# Patient Record
Sex: Male | Born: 1965 | Race: White | Hispanic: No | Marital: Single | Smoking: Current every day smoker
Health system: Southern US, Community
[De-identification: ages and names within clinical notes are randomized; demographics above are authoritative.]

## PROBLEM LIST (undated history)

## (undated) DIAGNOSIS — F1010 Alcohol abuse, uncomplicated: Secondary | ICD-10-CM

## (undated) DIAGNOSIS — I639 Cerebral infarction, unspecified: Secondary | ICD-10-CM

## (undated) DIAGNOSIS — M549 Dorsalgia, unspecified: Secondary | ICD-10-CM

## (undated) DIAGNOSIS — I10 Essential (primary) hypertension: Secondary | ICD-10-CM

## (undated) DIAGNOSIS — G4700 Insomnia, unspecified: Secondary | ICD-10-CM

## (undated) DIAGNOSIS — IMO0002 Reserved for concepts with insufficient information to code with codable children: Secondary | ICD-10-CM

## (undated) DIAGNOSIS — Z720 Tobacco use: Secondary | ICD-10-CM

## (undated) DIAGNOSIS — F419 Anxiety disorder, unspecified: Secondary | ICD-10-CM

## (undated) HISTORY — PX: UMBILICAL HERNIA REPAIR: SHX196

---

## 1998-05-16 ENCOUNTER — Emergency Department (HOSPITAL_COMMUNITY): Admission: EM | Admit: 1998-05-16 | Discharge: 1998-05-16 | Payer: Self-pay | Admitting: Emergency Medicine

## 1998-09-11 ENCOUNTER — Emergency Department (HOSPITAL_COMMUNITY): Admission: EM | Admit: 1998-09-11 | Discharge: 1998-09-11 | Payer: Self-pay | Admitting: Emergency Medicine

## 2000-10-24 ENCOUNTER — Emergency Department (HOSPITAL_COMMUNITY): Admission: EM | Admit: 2000-10-24 | Discharge: 2000-10-24 | Payer: Self-pay | Admitting: Emergency Medicine

## 2000-10-24 ENCOUNTER — Encounter: Payer: Self-pay | Admitting: Emergency Medicine

## 2000-10-31 ENCOUNTER — Emergency Department (HOSPITAL_COMMUNITY): Admission: EM | Admit: 2000-10-31 | Discharge: 2000-10-31 | Payer: Self-pay | Admitting: Internal Medicine

## 2000-11-09 ENCOUNTER — Emergency Department (HOSPITAL_COMMUNITY): Admission: EM | Admit: 2000-11-09 | Discharge: 2000-11-09 | Payer: Self-pay | Admitting: Emergency Medicine

## 2000-12-09 ENCOUNTER — Emergency Department (HOSPITAL_COMMUNITY): Admission: EM | Admit: 2000-12-09 | Discharge: 2000-12-09 | Payer: Self-pay | Admitting: *Deleted

## 2001-09-24 ENCOUNTER — Ambulatory Visit (HOSPITAL_COMMUNITY): Admission: RE | Admit: 2001-09-24 | Discharge: 2001-09-24 | Payer: Self-pay | Admitting: *Deleted

## 2001-09-28 ENCOUNTER — Emergency Department (HOSPITAL_COMMUNITY): Admission: EM | Admit: 2001-09-28 | Discharge: 2001-09-28 | Payer: Self-pay | Admitting: Emergency Medicine

## 2002-03-29 ENCOUNTER — Emergency Department (HOSPITAL_COMMUNITY): Admission: EM | Admit: 2002-03-29 | Discharge: 2002-03-29 | Payer: Self-pay | Admitting: Emergency Medicine

## 2002-05-01 ENCOUNTER — Ambulatory Visit (HOSPITAL_COMMUNITY): Admission: RE | Admit: 2002-05-01 | Discharge: 2002-05-01 | Payer: Self-pay | Admitting: Urology

## 2002-05-01 ENCOUNTER — Encounter: Payer: Self-pay | Admitting: Urology

## 2002-06-07 ENCOUNTER — Emergency Department (HOSPITAL_COMMUNITY): Admission: EM | Admit: 2002-06-07 | Discharge: 2002-06-07 | Payer: Self-pay | Admitting: *Deleted

## 2002-11-04 ENCOUNTER — Observation Stay (HOSPITAL_COMMUNITY): Admission: RE | Admit: 2002-11-04 | Discharge: 2002-11-05 | Payer: Self-pay | Admitting: General Surgery

## 2003-08-06 ENCOUNTER — Ambulatory Visit (HOSPITAL_COMMUNITY): Admission: RE | Admit: 2003-08-06 | Discharge: 2003-08-06 | Payer: Self-pay | Admitting: Family Medicine

## 2004-01-31 ENCOUNTER — Emergency Department (HOSPITAL_COMMUNITY): Admission: EM | Admit: 2004-01-31 | Discharge: 2004-01-31 | Payer: Self-pay | Admitting: Emergency Medicine

## 2004-05-19 ENCOUNTER — Ambulatory Visit (HOSPITAL_COMMUNITY): Admission: RE | Admit: 2004-05-19 | Discharge: 2004-05-19 | Payer: Self-pay | Admitting: Family Medicine

## 2004-11-07 ENCOUNTER — Emergency Department (HOSPITAL_COMMUNITY): Admission: EM | Admit: 2004-11-07 | Discharge: 2004-11-07 | Payer: Self-pay | Admitting: Emergency Medicine

## 2007-04-11 ENCOUNTER — Ambulatory Visit (HOSPITAL_COMMUNITY): Admission: RE | Admit: 2007-04-11 | Discharge: 2007-04-11 | Payer: Self-pay | Admitting: Family Medicine

## 2008-03-03 ENCOUNTER — Inpatient Hospital Stay (HOSPITAL_COMMUNITY): Admission: EM | Admit: 2008-03-03 | Discharge: 2008-03-05 | Payer: Self-pay | Admitting: Emergency Medicine

## 2008-03-03 ENCOUNTER — Ambulatory Visit: Payer: Self-pay | Admitting: Cardiology

## 2008-03-03 ENCOUNTER — Encounter (INDEPENDENT_AMBULATORY_CARE_PROVIDER_SITE_OTHER): Payer: Self-pay | Admitting: Pediatrics

## 2008-03-06 ENCOUNTER — Inpatient Hospital Stay (HOSPITAL_COMMUNITY): Admission: EM | Admit: 2008-03-06 | Discharge: 2008-03-07 | Payer: Self-pay | Admitting: Emergency Medicine

## 2008-09-23 ENCOUNTER — Ambulatory Visit (HOSPITAL_COMMUNITY): Admission: RE | Admit: 2008-09-23 | Discharge: 2008-09-23 | Payer: Self-pay | Admitting: Family Medicine

## 2008-10-14 ENCOUNTER — Emergency Department (HOSPITAL_COMMUNITY): Admission: EM | Admit: 2008-10-14 | Discharge: 2008-10-14 | Payer: Self-pay | Admitting: Emergency Medicine

## 2008-10-29 ENCOUNTER — Ambulatory Visit: Payer: Self-pay | Admitting: Internal Medicine

## 2008-10-29 ENCOUNTER — Inpatient Hospital Stay (HOSPITAL_COMMUNITY): Admission: EM | Admit: 2008-10-29 | Discharge: 2008-10-31 | Payer: Self-pay | Admitting: Emergency Medicine

## 2008-10-30 ENCOUNTER — Ambulatory Visit: Payer: Self-pay | Admitting: Gastroenterology

## 2008-11-08 ENCOUNTER — Encounter: Payer: Self-pay | Admitting: Internal Medicine

## 2009-07-18 ENCOUNTER — Ambulatory Visit (HOSPITAL_COMMUNITY): Admission: RE | Admit: 2009-07-18 | Discharge: 2009-07-18 | Payer: Self-pay | Admitting: Family Medicine

## 2010-04-21 ENCOUNTER — Inpatient Hospital Stay (HOSPITAL_COMMUNITY): Admission: EM | Admit: 2010-04-21 | Discharge: 2010-04-24 | Payer: Self-pay | Admitting: Emergency Medicine

## 2010-07-23 ENCOUNTER — Encounter: Payer: Self-pay | Admitting: Family Medicine

## 2010-07-24 ENCOUNTER — Encounter: Payer: Self-pay | Admitting: Family Medicine

## 2010-09-13 LAB — RAPID URINE DRUG SCREEN, HOSP PERFORMED
Amphetamines: NOT DETECTED
Barbiturates: NOT DETECTED
Benzodiazepines: POSITIVE — AB
Cocaine: NOT DETECTED
Opiates: NOT DETECTED
Tetrahydrocannabinol: NOT DETECTED

## 2010-09-13 LAB — URINALYSIS, ROUTINE W REFLEX MICROSCOPIC
Bilirubin Urine: NEGATIVE
Glucose, UA: NEGATIVE mg/dL
Hgb urine dipstick: NEGATIVE
Ketones, ur: NEGATIVE mg/dL
Nitrite: NEGATIVE
Protein, ur: NEGATIVE mg/dL
Specific Gravity, Urine: 1.02 (ref 1.005–1.030)
Urobilinogen, UA: 0.2 mg/dL (ref 0.0–1.0)
pH: 6 (ref 5.0–8.0)

## 2010-09-13 LAB — COMPREHENSIVE METABOLIC PANEL
ALT: 172 U/L — ABNORMAL HIGH (ref 0–53)
AST: 381 U/L — ABNORMAL HIGH (ref 0–37)
Albumin: 3.2 g/dL — ABNORMAL LOW (ref 3.5–5.2)
Alkaline Phosphatase: 313 U/L — ABNORMAL HIGH (ref 39–117)
BUN: 9 mg/dL (ref 6–23)
CO2: 24 mEq/L (ref 19–32)
Calcium: 8.5 mg/dL (ref 8.4–10.5)
Chloride: 98 mEq/L (ref 96–112)
Creatinine, Ser: 0.84 mg/dL (ref 0.4–1.5)
GFR calc Af Amer: >60 mL/min (ref >60)
GFR calc non Af Amer: >60 mL/min (ref >60)
Glucose, Bld: 108 mg/dL — ABNORMAL HIGH (ref 70–99)
Potassium: 3.8 mEq/L (ref 3.5–5.1)
Sodium: 133 mEq/L — ABNORMAL LOW (ref 135–145)
Total Bilirubin: 1.8 mg/dL — ABNORMAL HIGH (ref 0.3–1.2)
Total Protein: 7 g/dL (ref 6.0–8.3)

## 2010-09-13 LAB — HEPATIC FUNCTION PANEL
ALT: 120 U/L — ABNORMAL HIGH (ref 0–53)
AST: 191 U/L — ABNORMAL HIGH (ref 0–37)
Albumin: 2.8 g/dL — ABNORMAL LOW (ref 3.5–5.2)
Alkaline Phosphatase: 267 U/L — ABNORMAL HIGH (ref 39–117)
Bilirubin, Direct: 0.9 mg/dL — ABNORMAL HIGH (ref 0.0–0.3)
Indirect Bilirubin: 1.3 mg/dL — ABNORMAL HIGH (ref 0.3–0.9)
Total Bilirubin: 2.2 mg/dL — ABNORMAL HIGH (ref 0.3–1.2)
Total Protein: 6.4 g/dL (ref 6.0–8.3)

## 2010-09-13 LAB — CBC
HCT: 40.2 % (ref 39.0–52.0)
Hemoglobin: 15.4 g/dL (ref 13.0–17.0)
MCH: 37.8 pg — ABNORMAL HIGH (ref 26.0–34.0)
MCHC: 38.2 g/dL — ABNORMAL HIGH (ref 30.0–36.0)
MCV: 99.1 fL (ref 78.0–100.0)
Platelets: 198 10*3/uL (ref 150–400)
RBC: 4.06 MIL/uL — ABNORMAL LOW (ref 4.22–5.81)
RDW: 14.1 % (ref 11.5–15.5)
WBC: 8.3 10*3/uL (ref 4.0–10.5)

## 2010-09-13 LAB — DIFFERENTIAL
Band Neutrophils: 0 % (ref 0–10)
Basophils Absolute: 0.1 10*3/uL (ref 0.0–0.1)
Basophils Relative: 1 % (ref 0–1)
Blasts: 0 %
Eosinophils Absolute: 0.1 10*3/uL (ref 0.0–0.7)
Eosinophils Relative: 1 % (ref 0–5)
Lymphocytes Relative: 16 % (ref 12–46)
Lymphs Abs: 1.3 10*3/uL (ref 0.7–4.0)
Metamyelocytes Relative: 0 %
Monocytes Absolute: 0.2 10*3/uL (ref 0.1–1.0)
Monocytes Relative: 2 % — ABNORMAL LOW (ref 3–12)
Myelocytes: 0 %
Neutro Abs: 6.6 10*3/uL (ref 1.7–7.7)
Neutrophils Relative %: 80 % — ABNORMAL HIGH (ref 43–77)
Promyelocytes Absolute: 0 %
nRBC: 0 /100 WBC

## 2010-09-13 LAB — AMMONIA
Ammonia: 38 umol/L — ABNORMAL HIGH (ref 11–35)
Ammonia: 44 umol/L — ABNORMAL HIGH (ref 11–35)

## 2010-09-13 LAB — LIPASE, BLOOD
Lipase: 37 U/L (ref 11–59)
Lipase: 66 U/L — ABNORMAL HIGH (ref 11–59)

## 2010-09-13 LAB — ETHANOL: Alcohol, Ethyl (B): <5 mg/dL (ref 0–10)

## 2010-09-13 LAB — BASIC METABOLIC PANEL
CO2: 29 mEq/L (ref 19–32)
Calcium: 8.3 mg/dL — ABNORMAL LOW (ref 8.4–10.5)
GFR calc non Af Amer: >60 mL/min (ref >60)

## 2010-10-10 LAB — HEPATIC FUNCTION PANEL
AST: 223 U/L — ABNORMAL HIGH (ref 0–37)
Albumin: 3.7 g/dL (ref 3.5–5.2)
Total Protein: 7.1 g/dL (ref 6.0–8.3)

## 2010-10-10 LAB — ANA: Anti Nuclear Antibody(ANA): NEGATIVE

## 2010-10-10 LAB — MITOCHONDRIAL ANTIBODIES: Mitochondrial M2 Ab, IgG: <20.1 Units (ref <20.1)

## 2010-10-10 LAB — BASIC METABOLIC PANEL
Calcium: 9.1 mg/dL (ref 8.4–10.5)
Chloride: 98 mEq/L (ref 96–112)
Creatinine, Ser: 0.98 mg/dL (ref 0.4–1.5)
GFR calc Af Amer: >60 mL/min (ref >60)
Sodium: 131 mEq/L — ABNORMAL LOW (ref 135–145)

## 2010-10-10 LAB — CERULOPLASMIN: Ceruloplasmin: 29 mg/dL (ref 21–63)

## 2010-10-10 LAB — AMMONIA: Ammonia: 41 umol/L — ABNORMAL HIGH (ref 11–35)

## 2010-10-10 LAB — IGG, IGA, IGM: IgM, Serum: 109 mg/dL (ref 60–263)

## 2010-10-10 LAB — ANTI-SMOOTH MUSCLE ANTIBODY, IGG: F-Actin IgG: <20 U (ref <20)

## 2010-10-11 LAB — COMPREHENSIVE METABOLIC PANEL
ALT: 138 U/L — ABNORMAL HIGH (ref 0–53)
Albumin: 4.2 g/dL (ref 3.5–5.2)
Alkaline Phosphatase: 289 U/L — ABNORMAL HIGH (ref 39–117)
Glucose, Bld: 112 mg/dL — ABNORMAL HIGH (ref 70–99)
Potassium: 4 mEq/L (ref 3.5–5.1)
Sodium: 129 mEq/L — ABNORMAL LOW (ref 135–145)
Total Protein: 8.2 g/dL (ref 6.0–8.3)

## 2010-10-11 LAB — URINALYSIS, ROUTINE W REFLEX MICROSCOPIC
Ketones, ur: NEGATIVE mg/dL
Nitrite: NEGATIVE
Specific Gravity, Urine: 1.02 (ref 1.005–1.030)
pH: 5 (ref 5.0–8.0)

## 2010-10-11 LAB — RAPID URINE DRUG SCREEN, HOSP PERFORMED
Barbiturates: NOT DETECTED
Cocaine: NOT DETECTED
Opiates: NOT DETECTED

## 2010-10-11 LAB — IRON AND TIBC
Iron: 353 ug/dL — ABNORMAL HIGH (ref 42–135)
UIBC: <55 ug/dL

## 2010-10-11 LAB — DIFFERENTIAL
Basophils Relative: 1 % (ref 0–1)
Eosinophils Absolute: 0 10*3/uL (ref 0.0–0.7)
Eosinophils Relative: 1 % (ref 0–5)
Monocytes Absolute: 0.5 10*3/uL (ref 0.1–1.0)
Monocytes Relative: 13 % — ABNORMAL HIGH (ref 3–12)
Neutrophils Relative %: 46 % (ref 43–77)

## 2010-10-11 LAB — CBC
Hemoglobin: 14 g/dL (ref 13.0–17.0)
MCHC: 35.6 g/dL (ref 30.0–36.0)
RBC: 4.1 MIL/uL — ABNORMAL LOW (ref 4.22–5.81)

## 2010-10-11 LAB — CK TOTAL AND CKMB (NOT AT ARMC): CK, MB: 2.5 ng/mL (ref 0.3–4.0)

## 2010-10-11 LAB — FERRITIN: Ferritin: 1641 ng/mL — ABNORMAL HIGH (ref 22–322)

## 2010-10-11 LAB — AMMONIA: Ammonia: 66 umol/L — ABNORMAL HIGH (ref 11–35)

## 2010-11-14 NOTE — Discharge Summary (Signed)
Anthony Hoover, Anthony Hoover               ACCOUNT NO.:  [id number]   MEDICAL RECORD NO.:  [id number]          PATIENT TYPE:  INP   LOCATION:  3023                         FACILITY:  [facility]   PHYSICIAN:  Pramod P. Pearlean Brownie, MD    DATE OF BIRTH:  1965/12/24   DATE OF ADMISSION:  03/06/2008  DATE OF DISCHARGE:  03/07/2008                               DISCHARGE SUMMARY   ADMISSION DIAGNOSIS:  Increasing left-sided weakness.   DISCHARGE DIAGNOSES:  1. Transient worsening of her recent subacute right subcortical      infarct secondary to dehydration with urinary tract infection and      heavy alcohol intake.  2. Recent subcortical right brain infarct in September 2009, not      visualized on initial MRI, but seen on subsequent MRI on March 06, 2008.  3. Hyperlipidemia  4. Hypertension.  5. Chronic tobacco abuse and alcohol abuse.+   HOSPITAL COURSE:  Anthony Hoover is a 45 year old Caucasian gentleman who was  recently discharged from the hospital on March 05, 2008, with  admission on March 03, 2008, for presumed right subcortical infarct.  He presented at that time initially with n.p.o. was given IV t-PA.  He  made significant improvement after the t-PA infusion and recovered near  completely.  He had a stroke workup and was discharged home on aspirin  on March 05, 2008.  He states in the night he woke up almost every  hourly and went to the bathroom.  He woke up at 5:30 and went back to  sleep.  At 8:30, when he woke up, he noticed he had left-sided weakness  and clumsiness.  He was seen in the emergency room later that morning.  He was beyond the time window for thrombolysis plus Narcan given his  recent stroke and thrombolysis.  The patient was thought to be  dehydrated and had an urinary tract infection.  On his UA, he had 21-50  WBCs.  He was admitted, given IV hydration with normal saline, and  started on Cipro for UTI.  Limited MRI of the brain was repeated which  confirmed  a subtle area of diffusion positivity in the right corona  radiata subcortical region which was presumably his stroke from a few  days ago which had not been seen on the initial MRI.  His cholesterol  profile was repeated which was a little bit improved from the one done 4  days ago with total cholesterol down from 313 to 255, triglycerides 165  was decreased to 128, HDL was 65, LDL was 144, hemoglobin A1c was 5.6.  The patient improved with hydration and antibiotics.  He was seen by  physical therapy on the day of discharge and was able to ambulate 200  feet without any loss of balance and was close to his baseline mobility.  The patient was counseled to quit smoking as well as stop alcohol.  His  aspirin was changed to Plavix for secondary stroke prevention.  He was  discharged home in a stable condition and was also advised to consider  participation  by the IRIS stroke prevention trial.   His discharge medications are as follows:  Cipro 100 mg twice a day for  5 days, Plavix 75 mg a day, WelChol 1250 twice a day, Tricor 145 daily,  methadone 10 mg three times a day, and multivitamin once a day.   He was advised to call the office and make an appointment to see me in 2  months.            ______________________________  [name]. Pearlean Brownie, MD     PPS/MEDQ  D:  03/07/2008  T:  03/08/2008  Job:  [id number]

## 2010-11-14 NOTE — H&P (Signed)
NAMEPASCAL, STIGGERS               ACCOUNT NO.:  [id number]   MEDICAL RECORD NO.:  [id number]          PATIENT TYPE:  INP   LOCATION:  A313                          FACILITY:  [facility]   PHYSICIAN:  Tesfaye D. Felecia Shelling, MD   DATE OF BIRTH:  03-Jul-1965   DATE OF ADMISSION:  10/29/2008  DATE OF DISCHARGE:  LH                              HISTORY & PHYSICAL   CHIEF COMPLAINT:  Muscle cramps.   HISTORY OF PRESENT ILLNESS:  This is 45 years old male patient of Dr.  Oval Linsey, came to emergency room with above complaints.  The  patient has a history of CVA, hypertension, and alcohol and tobacco use.  He has been in his usual state of health until last night when he  started having worsening muscle cramps all over his body.  The patient  came to emergency room and he was evaluated.  His blood test showed low  level of sodium and elevated ammonia and liver function tests.  The  patient also screened for substance and alcohol.  His alcohol level was  zero and he had no any addictive substance in his blood test.  The  patient was then started on IV fluids and was admitted for further  evaluation.   REVIEW OF SYSTEMS:  The patient had no fever or chills, cough, chest  pain, nausea, vomiting, abdominal pain, dysuria, urgency, or frequency  of urination.   PAST MEDICAL HISTORY:  1. History of CVA.  2. Hypertension.  3. History of alcohol abuse.  4. History of narcotic abuse.  5. Hyperlipidemia.  6. Left gynecomastia.  7. Umbilical hernia and is status post repair.   SOCIAL HISTORY:  The patient is currently unemployed.  He smokes about 1-  pack of cigarettes per day.  The patient drinks beer.  He had history of  substance abuse in the past.  However, the patient is currently on  methadone.   MEDICATIONS:  1. Methadone 10 mg daily.  2. Cymbalta 30 mg daily.  3. Lisinopril HCT 20/12.5 mg daily.  4. Plavix 75 mg daily.  5. Topamax 100 mg daily.  6. Zetia 10 mg daily.  7. WelChol 2  tablets b.i.d.  8. Aspirin 81 mg daily.   SOCIAL HISTORY:  The patient is single.  He is currently employed.  He  has a history of alcohol, tobacco, and substance abuse.   PHYSICAL EXAMINATION:  GENERAL:  The patient is alert, awake, and  resting.  VITAL SIGNS:  Blood pressure 142/92, pulse 100, respiratory rate 20,  temperature 97.7 degrees Fahrenheit.  HEENT:  Pupils are equal and reactive.  NECK:  Supple.  CHEST:  Decreased air entry, bilateral rhonchi.  CARDIOVASCULAR SYSTEM:  First and second heart sound heard.  No murmur,  no gallop.  ABDOMEN:  Soft and relaxed.  Bowel sounds are positive.  No mass or  organomegaly.  EXTREMITIES:  No leg edema.   LABORATORY DATA ON ADMISSION:  Ammonia is 66, troponin is 0.02.  CK-MB  2.5, CPK 116.  CBC; WBC 3.9, hemoglobin 14.0, hematocrit 39.3, and  platelet 293.  Sodium  129, potassium 4.0, chloride 94, carbon dioxide  0.3, glucose 112, BUN 16, creatinine 1.03, and alkaline phosphatase 289.  AST 216, ALT 138, total bilirubin 1.7, total protein 8.2, albumin 4.2,  calcium 9.5.  PT 12.7, INR 0.9, PTT 29.   ASSESSMENT:  1. Hyponatremia, etiology unclear.  2. Elevated ammonia and abnormal liver function tests, probably      secondary to alcohol abuse.  3. Status post cerebrovascular accident.  4. Hypertension.  5. Hyperlipidemia.  6. History of alcohol, tobacco, and substance abuse.   PLAN:  We will continue the patient on normal saline.  We will do GI  consult.  We will monitor his BMP and continue the patient on his  regular treatment.      Tesfaye D. Felecia Shelling, MD  Electronically Signed     TDF/MEDQ  D:  10/29/2008  T:  10/30/2008  Job:  [id number]

## 2010-11-14 NOTE — H&P (Signed)
Anthony Hoover, Anthony Hoover               ACCOUNT NO.:  [id number]   MEDICAL RECORD NO.:  [id number]          PATIENT TYPE:  INP   LOCATION:  3023                         FACILITY:  [facility]   PHYSICIAN:  Pramod P. Pearlean Brownie, MD    DATE OF BIRTH:  05-25-1966   DATE OF ADMISSION:  03/06/2008  DATE OF DISCHARGE:                              HISTORY & PHYSICAL   REASON FOR REFERRAL:  Code stroke.   HISTORY OF PRESENT ILLNESS:  Anthony Hoover is a 45 year old Caucasian male,  who developed sudden onset of left-sided weakness and clumsiness this  morning at 5:30 a.m.  The patient states he was fine last night, but had  to get up almost every hour to go to the bathroom.  Last time, he woke  up at 5:30, when he walked to the bathroom, went back and slept and when  he woke up at 8:30, he noted his left side was weak and clumsy and he  had trouble controlling his left side.  He denied any headache or  slurred speech.  The patient had similar episode on March 03, 2008,  when he was pumping gas in the morning and his left foot got stuck to  the ground.  He also had some trouble using his left hand and could not  open the car door, but managed to drive himself and drove to the ER.  At  that time, he was found to have left hemiparesis and thought to have  right subcortical infarct.  CT scan was unremarkable.  He was given IV  TPA by Dr. Sharene Skeans.  The patient did well and recovered completely  after the TPA infusion.  His MRI scan, in fact, was negative for acute  stroke.  His vascular risk factors include hypertension, hyperlipidemia,  smoking, and alcohol.  The patient was started on aspirin and advised to  take WelChol and TriCor for his hyperlipidemia and advised to quit  smoking.  The patient states he has actually quit smoking and he was  doing well until last night.  He denies any fever or dysuria.   PAST MEDICAL HISTORY:  As stated above.   MEDICATION LIST:  WelChol, TriCor, aspirin, methadone for  chronic pain.   MEDICATION ALLERGIES:  None known.   FAMILY HISTORY:  Positive for stroke in maternal grandfather.   SOCIAL HISTORY:  The patient is unemployed Personnel officer.  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  He smokes 1-1/2 packs cigarettes per day since 20-30  years, just quit recently last week.  He drinks 10-12 beers per day.   REVIEW OF SYSTEMS:  Positive for dysuria, stroke, left-sided weakness as  stated above.   PHYSICAL EXAMINATION:  GENERAL:  Reveals a frail Caucasian male, who is  not in distress.  VITAL SIGNS:  He is afebrile, pulse rate is 110 per minute, blood  pressure is 130/80, respiratory 16 per minute, distal pulses are well  felt.  HEAD:  Nontraumatic.  NECK:  Supple with no bruit.  ENT:  Unremarkable.  CARDIAC:  No murmur or gallop.  LUNGS:  Clear to auscultation.  ABDOMEN:  Soft, nontender.  NEUROLOGIC:  He is awake, alert, and oriented to time, place, and  person.  There is no aphasia, apraxia, or dysarthria.  Eye movements are  full range.  There is no nystagmus.  Visual acuity and fields adequate.  Face is symmetric.  Tongue is midline.  Motor system exam reveals mild  left upper and lower extremity drift.  He has 4/5 strength on the left  side.  He has impaired finger-to-nose and knee-to-heel coordination on  the left.  He has significant weakness of left grip and intrinsic hand  muscles as well as left ankle dorsiflexors.  His NIH stroke scale is 4.  Gait was not tested.   DATA REVIEWED:  Noncontrast CAT scan of the head done today is  unremarkable.  Admission labs  reveal elevated liver enzymes and WBC  count is 14.1.  Electrolytes are normal.   IMPRESSION:  A 41-year gentleman with sudden onset of left-sided  weakness and clumsiness likely due to right hemispheric subcortical  infarct.  The patient had a similar admission less than a week ago for  similar presentation and was treated with IV thrombolysis with t-PA with  excellent results with MRI has  not shown a definite stroke.   PLAN:  The patient has presented within 5 hours of onset of his  symptoms, hence he will not qualify again for thrombolysis plus it is  written stroke and thrombolysis administration would rule him out anyway  for aggressive intervention.  The patient is admitted to the Stroke  Service.  We will give IV hydration.  Treat for presumed UTI.  Alcohol  withdrawal questions.  Given medications for hyperlipidemia, counseled  the patient on smoking cessation.  I have also counseled him to quit  alcohol as well.  Physical, occupational, speech therapy consults.  Changes aspirin to Plavix since he does have recurrent stroke being on  aspirin.  We will also check limited MRI scan of the brain, __________  imaging only since he has had a recent complete stroke workup.  I had  long discussion with the patient and his family and answered questions.           ______________________________  Anthony Hoover. Pearlean Brownie, MD     PPS/MEDQ  D:  03/06/2008  T:  03/07/2008  Job:  [id number]

## 2010-11-14 NOTE — Consult Note (Signed)
Anthony Hoover, Anthony Hoover               ACCOUNT NO.:  [id number]   MEDICAL RECORD NO.:  [id number]          PATIENT TYPE:  INP   LOCATION:  A313                          FACILITY:  [facility]   PHYSICIAN:  R. Roetta Sessions, M.D. DATE OF BIRTH:  Jun 24, 1966   DATE OF CONSULTATION:  10/29/2008  DATE OF DISCHARGE:                                 CONSULTATION   REASON FOR CONSULTATION:  Abnormal LFTs and elevated ammonia level.   REQUESTING PHYSICIAN:  Dr. Avon Gully covering for Dr. Oval Linsey.   HISTORY OF PRESENT ILLNESS:  The patient is a pleasant 45 year old  Caucasian gentleman with history of chronic alcohol abuse, CVA in  September of 2009 who presented to the hospital with complaints of  severe muscle cramps.  He also felt like he was in a fog.  He had a  headache.  Because of his history of stroke he felt like he needed to be  evaluated.  Upon presentation he was noted to have an elevated ammonia  level of 66, his total bilirubin was 1.7, alk phos 289, AST 216, ALT  138, albumin 4.2, his sodium level was 129 and his potassium normal,  alcohol level less than 5.  INR is 0.9.  He did not have any imaging  studies.  He was admitted for further evaluation.  We were consulted  regarding elevated ammonia level and abnormal LFTs.   Patient has a history of abnormal LFTs, at least dating back to  September of 2009 when he presented with a stroke.  At that time his  total bilirubin was 2.6, alk phos 610, AST 291, ALT 218.  He states that  they have remained abnormal as an outpatient.  He is not really sure of  his previous workup.  Review of eChart does not reveal any recent  imaging studies of his liver.   He denies any abdominal pain, nausea or vomiting, melena, bright red  blood per rectum and constipation, diarrhea, anorexia.  His weight has  been stable.  Denies any dysphagia or odynophagia.  He does have some  heartburn issues and takes Prevacid as needed.   MEDICATIONS AT  HOME:  1. Methadone 30 mg t.i.d.  2. Plavix 75 mg daily.  3. Lisinopril/HCTZ 20/12.5 mg daily.  4. Prevacid p.r.n.   ALLERGIES:  NO KNOWN DRUG ALLERGIES.   PAST MEDICAL HISTORY:  1. Chronic back pain.  2. History of narcotic abuse.  3. History of chronic alcohol abuse.  4. Hyperlipidemia.  5. Stroke.  6. Left gynecomastia.  7. Umbilical hernia repair.   FAMILY HISTORY:  Father succumbed to lung carcinoma.  He is not aware of  any family history of liver disease, colorectal cancer.   SOCIAL HISTORY:  He is single.  He has one daughter.  He is an  unemployed Personnel officer.  He consumes about 12 pack of beer daily.  Denies any other alcohol use.  He has been drinking heavily for over 20  years.  Denies any IV or intranasal drug use history.  He does have  tattoos.  No prior blood transfusions or blood  donations.   REVIEW OF SYSTEMS:  See HPI for GI and constitutional.  CARDIOPULMONARY:  He denies chest pain, shortness of breath, palpitations or cough.  GENITOURINARY:  Denies any dysuria or hematuria.  MUSCULOSKELETAL:  Complains of severe cramping, no tremors.  No weakness.   PHYSICAL EXAMINATION:  Temperature 97.7, pulse 100, respirations 2,  blood pressure 142/92, O2 sats 97% on room air.  GENERAL:  Pleasant, well-nourished, well-developed Caucasian gentleman  in no acute distress.  He appears quite comfortable.  SKIN:  Warm and dry, no jaundice.  HEENT:  Sclerae are nonicteric, oropharyngeal mucosa moist and pink.  CHEST:  Lungs are clear to auscultation.  CARDIAC:  Regular rate and rhythm, no murmurs, rubs or gallops.  ABDOMEN:  Positive bowel sounds, abdomen soft, he has mild epigastric  tenderness to deep palpation, no rebound or guarding.  Liver edge easily  palpable in the right costal margin and the midclavicular line, smooth  and nontender.  No abdominal bruits or hernias.  LOWER EXTREMITIES:  No edema.  NEURO:  Negative asterixis.   LABS:  As above.  In addition,  his hemoglobin is 14, platelets 294,000,  albumin 4.2, creatinine 1.05.   IMPRESSION:  Patient is a 45 year old gentleman with history of alcohol  abuse for over 20 years who presents with muscle cramps, hyponatremia,  elevated ammonia level and abnormal liver function tests.  His liver  function tests have been abnormal dating back at least to September of  2009 when he presented with a stroke.  At this point in time it is  unclear as to extent of the workup.  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  There is no  evidence of hepatic encephalopathy on exam today.   RECOMMENDATIONS:  1. Abdominal ultrasound.  2. Viral hepatitis markers, iron and TIBC ferritin level.  3. LFTs in the morning.  4. Agree with DT preventative protocol.  5. Patient is advised that he needs to strive for alcohol cessation.  6. I would like to thank Dr. Janna Arch for allowing [place] to participate      in the care of this patient.      Tana Coast, P.AJonathon Bellows, M.D.  Electronically Signed    LL/MEDQ  D:  10/29/2008  T:  10/29/2008  Job:  [id number]   cc:   Melvyn Novas, MD  Fax: [phone]

## 2010-11-14 NOTE — H&P (Signed)
NAMECOSTA, JHA               ACCOUNT NO.:  [id number]   MEDICAL RECORD NO.:  [id number]          PATIENT TYPE:  INP   LOCATION:  3103                         FACILITY:  [facility]   PHYSICIAN:  Deanna Artis. Hickling, M.D.DATE OF BIRTH:  12-28-1965   DATE OF ADMISSION:  03/03/2008  DATE OF DISCHARGE:                              HISTORY & PHYSICAL   CHIEF COMPLAINT:  Left leg would not move.   HISTORY OF PRESENT ILLNESS:  The patient was pumping gas around 9:50  this morning when he noted that his left foot stuck to the ground.  He  noticed weakness in his left arm.  He did not notice left facial  weakness or slurred speech, did not have any trouble with his vision,  tingling or numbness, or altered mental status.  He did not complain of  a headache.  He had no nausea or vomiting.   The patient was brought by EMS to [facility].  I was contacted  at 1003.  CT scan was carried out at that time.  Results were known to  me at 1010 (I read the study).  Initially, I examined him at 1015.  When  determined that he had left-sided weakness from a small vessel disease,  ordered intravenous TPA, which was administered at 1044 a.m.  Because of  his small vessel disease, he was given a full dose of 0.9 mg/kg.   PAST MEDICAL HISTORY:  The patient injured his back while working a  number of years ago.  He said he had a herniated nucleus pulposus but  did not have surgery.  He has had surgery to repair an umbilical hernia  about 10 years ago.   His risk factors for stroke include hypertension, dyslipidemia, and  smoking.  He did not take antihypertensive medicines because he said he  could not afford it.  He currently is taking [employer].  He  does not know the doses.  He is also on methadone 10 mg 3 times daily  for his pain.   DRUG ALLERGIES:  None known.   FAMILY HISTORY:  Positive for stroke in maternal grandfather, otherwise,  negative for atherosclerotic disease, heart  disease, or other neurologic  conditions.   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  He lives with his daughter (from prior marriage) and his  mother.  He smokes one and a half packs of cigarettes per day and has  done so since his teen years.  He drinks 10-12 beers per day, every day.  He denies the use of drugs.   12-SYSTEM REVIEW:  He has normal appetite and sleep patterns.  HEAD AND  NECK:  No infections.  LUNGS:  No persistent cough.  GI: No nausea,  vomiting, or diarrhea.  GU:  He has prostatism.  MUSCULOSKELETAL:  He  had back injury as noted above.  ENDOCRINE:  No diabetes.  REPRODUCTIVE:  No complaints.  HEMATOLOGIC:  No anemia.  ALLERGY AND IMMUNOLOGY:  Negative.  NEUROPSYCHIATRIC:  No depression or anxiety.  A 12-system  review is, otherwise, negative.   PHYSICAL EXAMINATION:  VITAL SIGNS:  Temperature 98.2, blood pressure  114/76, resting pulse 82, respirations 20, oxygen saturation 96% on room  air, and weight 178 pounds.  HEAD, EYES, EARS, NOSE, AND THROAT:  No signs of infection.  No bruits.  NECK:  Supple.  Full range of motion.  LUNGS:  Clear.  HEART:  No murmurs.  Pulses are normal.  ABDOMEN:  Soft.  Bowel sounds normal.  No hepatosplenomegaly.  SKIN:  Pink and warm.  NEUROLOGIC:  Awake, alert, attentive, and appropriate.  No dysphasia or  dyspraxia.  CRANIAL NERVES:  Round and reactive pupils.  Fundi normal.  Visual  fields full.  Extraocular movements full and conjugate.  Left central  seventh.  Midline tongue.  Air conduction greater than bone conduction  bilaterally.   MOTOR:  Weakness in the left arm, 4 in the deltoid and 4+ in the triceps  and biceps.  Fine motor movements were somewhat clumsy on finger  apposition.  Left leg hip flexors and psoas are 4-4+, but otherwise,  distally he is normal.  He shows some drift in the left leg but not in  the left arm.  Right side is normal.   Reflexes were diminished and  symmetric.  The patient had a neutral left  plantar and a right flexor plantar. There is no hemisensory loss.  There  is no hemi dystaxia.  Gait was not tested.   His NIH stroke scale score was 10 and at 1020 a.m. was 2.  Modified  Rankin prior to his stroke was 0.  Nutritional status is normal.   LABORATORY:  Today, white blood cell count 6600, 48 polys, 39 lymphs, 10  monos, and 3 eosinophils.  Hemoglobin 12.3, hematocrit 35.1, and  platelet count 199,000.  PT 12.7, INR 0.9, and PTT 27.  CT scan of the  brain, which I reviewed at 1010 a.m. was normal.   IMPRESSION:  1. 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  I cannot rule out more proximal occlusion with a      watershed infarct, but I think it is unlikely.  2. Risk factors for stroke:  Hypertension, dyslipidemia, and smoking.  3. Alcohol abuse:  We need to watch carefully for development of      delirium tremens.   PLAN:  The patient was given a full dose of IV tPA, because I believe it  is a small vessel stroke.  We will carry out MRI of the brain, MRA  intracranial, carotid Doppler, 2D echocardiogram, and laboratories for  fasting, lipid panel, serum homocysteine, and hemoglobin A1c.  Laboratories will have to be carried out on March 05, 2008, because  of the timing of this.  We placed a Foley catheter because of his  history of prostatism, and there was some difficulty doing this.  He  will be administered swallowing study before we allow him to take  anything orally.  We will treat him with aspirin 24 hours after his tPA  was administered.  I have discussed smoking cessation with him, and I  offered a Nicoderm patch.  I have also told him that we will treat him  for DTs if he has problems with alcohol withdrawal.  I explained in  detail the medicines and risks of tPA before it was administered, and he  agreed to treatment.  I spent an hour and 15 minutes of critical care  time deciding  over his treatment explaining benefits and alternatives to  him, and making decisions related to the administration of tPA,  reviewing labs, and examining the patient.      Deanna Artis. Sharene Skeans, M.D.  Electronically Signed     [facility]/MEDQ  D:  03/03/2008  T:  03/04/2008  Job:  [id number]   cc:   Melvyn Novas, MD

## 2010-11-17 NOTE — Op Note (Signed)
NAME:  Anthony Hoover, Anthony Hoover                         ACCOUNT NO.:  [id number]   MEDICAL RECORD NO.:  [id number]                   PATIENT TYPE:  AMB   LOCATION:  DAY                                  FACILITY:  [facility]   PHYSICIAN:  Barbaraann Barthel, M.D.              DATE OF BIRTH:  September 02, 1965   DATE OF PROCEDURE:  11/04/2002  DATE OF DISCHARGE:                                 OPERATIVE REPORT   PREOPERATIVE DIAGNOSIS:  Umbilical hernia.   POSTOPERATIVE DIAGNOSIS:  Umbilical hernia.   OPERATION/PROCEDURE:  Umbilical hernia repair.   SURGEON:  Barbaraann Barthel, M.D.   SPECIMENS:  Umbilical hernia and incarcerated omentum.   INDICATIONS:  This is a 45 year old white male we scheduled for elective  umbilical hernia repair.  He was noted to have a mass that changed within  his umbilicus which was consistent with a sliding umbilical hernia with  likely omentum within it.  He had no incarceration preoperatively.  We  scheduled him for elective surgery.  We discussed complications not limited  to but including bleeding, infection and recurrence.  Informed consent was  obtained.   GROSS OPERATIVE FINDINGS:  Those consistent with an umbilical hernia.  The  umbilical hernia had omentum within it.  There were no other abnormalities.   DESCRIPTION OF PROCEDURE:  The patient was placed in the supine position.  After the adequate administration of general anesthesia via LMA anesthesia,  his entire abdomen was shaved and prepped with Betadine solution and draped  in the usual manner.  The shave and prep was just around the umbilicus,  however.  We did a periumbilical incision around the superior aspect of the  umbilicus.  This dissected free the umbilical sac from the hernia sac and  then excised the redundant portion of omentum that was within the hernia  sac.  We then closed the umbilical hernia with 0 monofilament Prolene and  after checking for hemostasis, we then anesthetized the area around  the  umbilical hernia site which was approximately the size of a 25-cent piece.  I did not feel that mesh was necessary.  We infiltrated around the hernia  sac with 0.5% Sensorcaine.  Approximately 9 cc were utilized.  The wound was  then irrigated.  The subcutaneous layer was closed with 3-0 Polysorb and the  skin was approximated  with stapling device.  Sterile dressing with Neosporin ointment was applied.  Prior to closure all sponge, needle and instrument counts were found to be  correct.  Estimated blood loss was minimal.  No drains were placed.  The  patient received approximately 1 L of crystalloids intraoperatively.                                               Barbaraann Barthel, M.D.  WB/MEDQ  D:  11/04/2002  T:  11/04/2002  Job:  [id number]   cc:   Patrica Duel, M.D.  [address], [address]  [place]  [place] [address]  Fax: [phone]

## 2010-11-17 NOTE — Discharge Summary (Signed)
Anthony Hoover, GLASSCOCK               ACCOUNT NO.:  [id number]   MEDICAL RECORD NO.:  [id number]          PATIENT TYPE:  INP   LOCATION:  A313                          FACILITY:  [facility]   PHYSICIAN:  Tesfaye D. Felecia Shelling, MD   DATE OF BIRTH:  1965-12-07   DATE OF ADMISSION:  10/29/2008  DATE OF DISCHARGE:  05/02/2010LH                               DISCHARGE SUMMARY   DISCHARGE DIAGNOSES:  1. Hyponatremia.  2. Muscle spasm.  3. Alcoholic hepatitis.  4. History of cerebrovascular accident.  5. Hyperlipidemia.  6. History of substance abuse.   DISCHARGE MEDICATIONS:  1. Methadone 10 mg daily.  2. Cymbalta 30 mg daily.  3. Lisinopril/hydrochlorothiazide 20/12.5 one tablet daily.  4. Plavix 75 mg daily.  5. Topamax 100 mg daily.  6. Zetia 10 mg daily.  7. WelChol 2 tablets b.i.d.  8. Aspirin 81 mg daily.  9. Protonix 40 mg daily.   DISPOSITION:  The patient was discharged to home in stable condition.   HOSPITAL COURSE:  This is a 44 year old male patient who was brought to  emergency room due to muscle spasm.  The patient has a history of  hypertension, hyponatremia, and alcohol abuse.  On admission, his sodium  was very low.  His liver function and ammonia liver were elevated.  The  patient was started on IV fluid and was admitted.  GI consult was done  and the patient was evaluated.  Over a hospital stay, his symptoms  resolved.  His sodium improved.  On discharge, his sodium was 131.  His  liver function remained slightly elevated.  The patient was discharged  home to continues current treatment and to follow up in outpatient.      Tesfaye D. Felecia Shelling, MD  Electronically Signed     TDF/MEDQ  D:  11/11/2008  T:  11/12/2008  Job:  [id number]

## 2010-11-17 NOTE — Discharge Summary (Signed)
Anthony Hoover, Anthony Hoover               ACCOUNT NO.:  [id number]   MEDICAL RECORD NO.:  [id number]          PATIENT TYPE:  INP   LOCATION:  3022                         FACILITY:  [facility]   PHYSICIAN:  Pramod P. Pearlean Brownie, MD    DATE OF BIRTH:  [birth year]   DATE OF ADMISSION:  03/03/2008  DATE OF DISCHARGE:  03/05/2008                               DISCHARGE SUMMARY   ADMISSION DIAGNOSIS:  Code stroke.   DISCHARGE DIAGNOSES:  1. Small right subcortical infarct, not visualized on MRI, status post      intravenous tissue plasminogen activator.  2. Hyperlipidemia.  3. Smoking.   HOSPITAL COURSE:  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  He managed to get into the car and drive himself  to the hospital.  The Code Stroke was called on admission.  He was found  to have significant left-sided weakness with NIH Stroke Scale of 2 on  admission.  CT scan showed no acute abnormalities.  He met eligibility  criteria for IV t-PA, which was administered in the full dose  uneventfully, he was monitored in the intensive care unit where the  blood pressure was tightly controlled.  He showed significant  improvement in his symptoms.  Rest of the workup showed an MRI scan of  the brain showed no definite acute infarct.  MRA showed no significant  intracranial stenosis.  Carotid ultrasound showed no hemodynamically  significant stenosis.  His total cholesterol was found to be elevated at  306, LDL was elevated at 211, HDL was normal at 59, and triglycerides  were elevated at 178.  His body mass index was 25.5.  The patient's 2D  echo showed normal ejection fraction without obvious cardiac source of  embolism.  The patient was started on aspirin for secondary stroke  prevention and smoking cessation counseling was obtained to help him  quit smoking.   At the time of discharge, his medications  include:  1. Aspirin 325 mg a day.  2. WelChol 1250 mg twice a day.  3. Methadone 10 mg three times a day.  4. Vytorin 10/40 one tablet daily.   He was advised to follow up with his primary physician in a few weeks  and with Dr. Pearlean Brownie in his office in 2 months.  He was advised to quit  smoking.           ______________________________  [name]. Pearlean Brownie, MD     PPS/MEDQ  D:  04/01/2008  T:  04/02/2008  Job:  [id number]

## 2011-04-04 LAB — DIFFERENTIAL
Basophils Absolute: 0
Basophils Relative: 0
Eosinophils Absolute: 0.2
Eosinophils Relative: 0
Lymphocytes Relative: 10 — ABNORMAL LOW
Lymphocytes Relative: 9 — ABNORMAL LOW
Lymphs Abs: 1.4
Lymphs Abs: 1.5
Lymphs Abs: 2.6
Monocytes Absolute: 1.5 — ABNORMAL HIGH
Monocytes Relative: 10
Monocytes Relative: 9
Neutrophils Relative %: 48
Neutrophils Relative %: 81 — ABNORMAL HIGH

## 2011-04-04 LAB — RAPID URINE DRUG SCREEN, HOSP PERFORMED
Amphetamines: NOT DETECTED
Benzodiazepines: POSITIVE — AB
Cocaine: NOT DETECTED
Tetrahydrocannabinol: NOT DETECTED

## 2011-04-04 LAB — CARDIAC PANEL(CRET KIN+CKTOT+MB+TROPI)
CK, MB: 0.6
Relative Index: INVALID
Total CK: 36

## 2011-04-04 LAB — COMPREHENSIVE METABOLIC PANEL
ALT: 118 — ABNORMAL HIGH
ALT: 218 — ABNORMAL HIGH
AST: 94 — ABNORMAL HIGH
CO2: 25
CO2: 26
Calcium: 9
Calcium: 9.2
Chloride: 105
Creatinine, Ser: 0.68
Creatinine, Ser: 0.71
GFR calc Af Amer: >60
GFR calc non Af Amer: >60
GFR calc non Af Amer: >60
Glucose, Bld: 122 — ABNORMAL HIGH
Glucose, Bld: 95
Sodium: 135
Sodium: 139
Total Bilirubin: 2.3 — ABNORMAL HIGH
Total Protein: 7.1

## 2011-04-04 LAB — LIPID PANEL
LDL Cholesterol: 144 — ABNORMAL HIGH
Triglycerides: 128
VLDL: 36

## 2011-04-04 LAB — URINALYSIS, ROUTINE W REFLEX MICROSCOPIC
Bilirubin Urine: NEGATIVE
Glucose, UA: NEGATIVE
Glucose, UA: NEGATIVE
Hgb urine dipstick: NEGATIVE
Ketones, ur: NEGATIVE
Ketones, ur: NEGATIVE
Protein, ur: NEGATIVE
Protein, ur: NEGATIVE
pH: 5

## 2011-04-04 LAB — CBC
HCT: 29.6 — ABNORMAL LOW
HCT: 35.1 — ABNORMAL LOW
Hemoglobin: 10.2 — ABNORMAL LOW
MCHC: 34.5
MCHC: 35.1
MCV: 96.3
MCV: 97.8
MCV: 98
Platelets: 193
Platelets: 199
RBC: 3.13 — ABNORMAL LOW
RDW: 14.5
WBC: 14.6 — ABNORMAL HIGH

## 2011-04-04 LAB — PROTIME-INR
INR: 0.9
INR: 1
Prothrombin Time: 12.7
Prothrombin Time: 13.6

## 2011-04-04 LAB — URINE CULTURE

## 2011-04-04 LAB — URINE MICROSCOPIC-ADD ON

## 2011-04-04 LAB — GLUCOSE, CAPILLARY
Glucose-Capillary: 106 — ABNORMAL HIGH
Glucose-Capillary: 112 — ABNORMAL HIGH
Glucose-Capillary: 118 — ABNORMAL HIGH

## 2011-04-04 LAB — CK TOTAL AND CKMB (NOT AT ARMC)
Relative Index: INVALID
Relative Index: INVALID
Total CK: 38

## 2011-04-04 LAB — HEMOGLOBIN A1C
Hgb A1c MFr Bld: 5.7
Mean Plasma Glucose: 114
Mean Plasma Glucose: 117

## 2011-04-04 LAB — TROPONIN I: Troponin I: 0.05

## 2011-09-24 ENCOUNTER — Emergency Department (HOSPITAL_COMMUNITY): Payer: Medicaid Other

## 2011-09-24 ENCOUNTER — Other Ambulatory Visit: Payer: Self-pay

## 2011-09-24 ENCOUNTER — Encounter (HOSPITAL_COMMUNITY): Payer: Self-pay | Admitting: Emergency Medicine

## 2011-09-24 ENCOUNTER — Inpatient Hospital Stay (HOSPITAL_COMMUNITY)
Admission: EM | Admit: 2011-09-24 | Discharge: 2011-10-01 | DRG: 184 | Disposition: A | Discharge status: Rehabilitation Facility | Payer: Medicaid Other | Attending: General Surgery | Admitting: General Surgery

## 2011-09-24 DIAGNOSIS — F1010 Alcohol abuse, uncomplicated: Secondary | ICD-10-CM | POA: Diagnosis present

## 2011-09-24 DIAGNOSIS — S22070A Wedge compression fracture of T9-T10 vertebra, initial encounter for closed fracture: Secondary | ICD-10-CM | POA: Diagnosis present

## 2011-09-24 DIAGNOSIS — S22000A Wedge compression fracture of unspecified thoracic vertebra, initial encounter for closed fracture: Secondary | ICD-10-CM

## 2011-09-24 DIAGNOSIS — I10 Essential (primary) hypertension: Secondary | ICD-10-CM | POA: Diagnosis present

## 2011-09-24 DIAGNOSIS — Z8673 Personal history of transient ischemic attack (TIA), and cerebral infarction without residual deficits: Secondary | ICD-10-CM

## 2011-09-24 DIAGNOSIS — J9383 Other pneumothorax: Secondary | ICD-10-CM | POA: Diagnosis present

## 2011-09-24 DIAGNOSIS — Y998 Other external cause status: Secondary | ICD-10-CM

## 2011-09-24 DIAGNOSIS — S2220XA Unspecified fracture of sternum, initial encounter for closed fracture: Secondary | ICD-10-CM

## 2011-09-24 DIAGNOSIS — S22009A Unspecified fracture of unspecified thoracic vertebra, initial encounter for closed fracture: Secondary | ICD-10-CM | POA: Diagnosis present

## 2011-09-24 DIAGNOSIS — W1789XA Other fall from one level to another, initial encounter: Secondary | ICD-10-CM | POA: Diagnosis present

## 2011-09-24 DIAGNOSIS — D62 Acute posthemorrhagic anemia: Secondary | ICD-10-CM | POA: Diagnosis present

## 2011-09-24 DIAGNOSIS — S2242XA Multiple fractures of ribs, left side, initial encounter for closed fracture: Secondary | ICD-10-CM | POA: Diagnosis present

## 2011-09-24 DIAGNOSIS — T07XXXA Unspecified multiple injuries, initial encounter: Secondary | ICD-10-CM

## 2011-09-24 DIAGNOSIS — S2249XA Multiple fractures of ribs, unspecified side, initial encounter for closed fracture: Principal | ICD-10-CM | POA: Diagnosis present

## 2011-09-24 DIAGNOSIS — S27321A Contusion of lung, unilateral, initial encounter: Secondary | ICD-10-CM | POA: Diagnosis present

## 2011-09-24 DIAGNOSIS — W14XXXA Fall from tree, initial encounter: Secondary | ICD-10-CM | POA: Diagnosis present

## 2011-09-24 DIAGNOSIS — S22060A Wedge compression fracture of T7-T8 vertebra, initial encounter for closed fracture: Secondary | ICD-10-CM | POA: Diagnosis present

## 2011-09-24 DIAGNOSIS — G8929 Other chronic pain: Secondary | ICD-10-CM | POA: Diagnosis present

## 2011-09-24 DIAGNOSIS — S27329A Contusion of lung, unspecified, initial encounter: Secondary | ICD-10-CM | POA: Diagnosis present

## 2011-09-24 DIAGNOSIS — Z789 Other specified health status: Secondary | ICD-10-CM | POA: Diagnosis present

## 2011-09-24 HISTORY — DX: Essential (primary) hypertension: I10

## 2011-09-24 HISTORY — DX: Dorsalgia, unspecified: M54.9

## 2011-09-24 HISTORY — DX: Anxiety disorder, unspecified: F41.9

## 2011-09-24 HISTORY — DX: Insomnia, unspecified: G47.00

## 2011-09-24 HISTORY — DX: Cerebral infarction, unspecified: I63.9

## 2011-09-24 LAB — URINALYSIS, ROUTINE W REFLEX MICROSCOPIC
Bilirubin Urine: NEGATIVE
Glucose, UA: NEGATIVE mg/dL
Specific Gravity, Urine: <1.005 — ABNORMAL LOW (ref 1.005–1.030)
Urobilinogen, UA: 0.2 mg/dL (ref 0.0–1.0)

## 2011-09-24 LAB — BASIC METABOLIC PANEL
BUN: 9 mg/dL (ref 6–23)
CO2: 17 mEq/L — ABNORMAL LOW (ref 19–32)
Calcium: 9.5 mg/dL (ref 8.4–10.5)
GFR calc non Af Amer: >90 mL/min (ref >90)
Glucose, Bld: 115 mg/dL — ABNORMAL HIGH (ref 70–99)
Potassium: 4.5 mEq/L (ref 3.5–5.1)

## 2011-09-24 LAB — URINE MICROSCOPIC-ADD ON

## 2011-09-24 LAB — CBC
HCT: 33.6 % — ABNORMAL LOW (ref 39.0–52.0)
Hemoglobin: 11.2 g/dL — ABNORMAL LOW (ref 13.0–17.0)
MCH: 33.9 pg (ref 26.0–34.0)
MCHC: 33.3 g/dL (ref 30.0–36.0)
RBC: 3.3 MIL/uL — ABNORMAL LOW (ref 4.22–5.81)

## 2011-09-24 MED ORDER — MORPHINE SULFATE 4 MG/ML IJ SOLN: 4.0000 mg | Freq: Once | INTRAMUSCULAR | Status: DC

## 2011-09-24 MED ORDER — HYDROMORPHONE HCL PF 1 MG/ML IJ SOLN
INTRAMUSCULAR | Status: AC
  Filled 2011-09-24: qty 1

## 2011-09-24 MED ORDER — SODIUM CHLORIDE 0.9 % IV BOLUS (SEPSIS)
250.0000 mL | Freq: Once | INTRAVENOUS | Status: AC
  Administered 2011-09-24: 20:00:00 via INTRAVENOUS

## 2011-09-24 MED ORDER — HYDROMORPHONE HCL PF 1 MG/ML IJ SOLN
INTRAMUSCULAR | Status: AC
Start: 2011-09-24 — End: 2011-09-24
  Administered 2011-09-24: 1 mg via INTRAVENOUS
  Filled 2011-09-24: qty 1

## 2011-09-24 MED ORDER — SODIUM CHLORIDE 0.9 % IV SOLN
INTRAVENOUS | Status: DC
  Administered 2011-09-24: 100 mL/h via INTRAVENOUS
  Administered 2011-09-25 (×2): via INTRAVENOUS

## 2011-09-24 MED ORDER — HYDROMORPHONE HCL PF 1 MG/ML IJ SOLN
1.0000 mg | Freq: Once | INTRAMUSCULAR | Status: AC
  Administered 2011-09-24: 1 mg via INTRAVENOUS
  Filled 2011-09-24: qty 1

## 2011-09-24 MED ORDER — IOHEXOL 300 MG/ML  SOLN
100.0000 mL | Freq: Once | INTRAMUSCULAR | Status: AC | PRN
  Administered 2011-09-24: 100 mL via INTRAVENOUS

## 2011-09-24 MED ORDER — HYDROMORPHONE HCL PF 1 MG/ML IJ SOLN
1.0000 mg | Freq: Once | INTRAMUSCULAR | Status: AC
  Administered 2011-09-24: 1 mg via INTRAVENOUS

## 2011-09-24 MED ORDER — ONDANSETRON HCL 4 MG/2ML IJ SOLN
4.0000 mg | Freq: Once | INTRAMUSCULAR | Status: AC
  Administered 2011-09-24: 4 mg via INTRAVENOUS
  Filled 2011-09-24: qty 2

## 2011-09-24 NOTE — ED Notes (Signed)
Dr. Wilson at bedside to see pt.

## 2011-09-24 NOTE — ED Notes (Signed)
In CT and sats are at 91%  Dropped to 89%  Tank empty - placed on 4L 02 from wall outlet. Sats increased to 97 % Returned from CT - verbal order received from Dr Herma Carson for Hydromorphone 1mg  -IV C/o severe back pain -- states he cannot breathe due to the pain -  Continues to request something to drink - remains NPO Requested patient to void - states he cannot - advised would need to insert a foley catheter - patient refuses at this time

## 2011-09-24 NOTE — ED Notes (Signed)
Called [facility] - advised patient being transported via [facility].  Report given to Alfonzo Feller, Statistician.  [place] forms completed and provided to EMS.

## 2011-09-24 NOTE — ED Notes (Signed)
States pain medication may have helped just a "tiny bit"

## 2011-09-24 NOTE — ED Notes (Signed)
Top and bottom dentures removed and placed in denture cup labeled with patient information, dentures placed in patients belongings bad. Patient made aware of belongings in bag. Mouth cleaned at patient request. Belongings given to family at patient request.

## 2011-09-24 NOTE — ED Notes (Signed)
On arrival patient stated he only takes medication for high blood pressure. Bagging clothes to give to mother- metal pill container found- states the pills are viagra, 81 mg ASA, methadone, Xanax 1mg  and prevacid.  Numbers on pills given to pharmacy tech to identify- pill case, jeans, boots, socks given to mother.  Coat cut off and also T-shirt.

## 2011-09-24 NOTE — ED Notes (Signed)
Dr. Deretha Emory aware of trauma patient.

## 2011-09-24 NOTE — ED Notes (Signed)
Pt arrived, transferred from [facility].  Waiting for Trauma MD to arrive.  Pt c/o back pain, 10/10.

## 2011-09-24 NOTE — ED Provider Notes (Addendum)
History   This chart was scribed for Anthony Jakes, MD by Sofie Rower. The patient was seen in room APA10/APA10 and the patient's care was started at 8:04PM.    CSN: [id number]  Arrival date & time 09/24/11  2831   First MD Initiated Contact with Patient 09/24/11 1956      Chief Complaint  Patient presents with  . Fall    fell 20 feet out of tree    (Consider location/radiation/quality/duration/timing/severity/associated sxs/prior treatment) Patient is a 46 y.o. male presenting with fall.  Fall    Anthony Hoover is a 46 y.o. male who presents to the Emergency Department complaining of severe, episodic fall onset today with associated symptoms of back pain.   Pt states "he fell from a ladder, out of a tree, where he was cutting limbs". Pt reports "his chest hurts and he can not breath". Pt was not able to walk after he fell. The pt is not able to recall what part of his body hit the ground upon impact. Modifying factors include immobilization on spinal board with moderate relief. Pt has a hx of back pain.  Pt denies abdominal pain, neck pain, fever, cough, loss of consciousness.     Past Medical History  Diagnosis Date  . Hypertension     History reviewed. No pertinent past surgical history.  No family history on file.  History  Substance Use Topics  . Smoking status: Unknown If Ever Smoked  . Smokeless tobacco: Not on file  . Alcohol Use: Yes      Review of Systems  All other systems reviewed and are negative.    10 Systems reviewed and are negative for acute change except as noted in the HPI.   Allergies  Review of patient's allergies indicates no known allergies.  Home Medications   Current Outpatient Rx  Name Route Sig Dispense Refill  . ALPRAZOLAM 1 MG PO TABS Oral Take 1 mg by mouth at bedtime as needed.    . ASPIRIN EC 81 MG PO TBEC Oral Take 81 mg by mouth daily.    Marland Kitchen LANSOPRAZOLE 30 MG PO CPDR Oral Take 30 mg by mouth daily.    Marland Kitchen METHADONE  HCL 10 MG PO TABS Oral Take 10 mg by mouth every 8 (eight) hours.    Marland Kitchen VIAGRA PO Oral Take 0.5-1 tablets by mouth as needed. For intercourse      BP 119/74  Pulse 111  Temp(Src) 97.6 F (36.4 C) (Oral)  Resp 24  SpO2 92%  Physical Exam  Nursing note and vitals reviewed. Constitutional: He is oriented to person, place, and time. He appears well-developed and well-nourished.  HENT:  Head: Normocephalic and atraumatic.  Right Ear: External ear normal.  Left Ear: External ear normal.  Nose: Nose normal.  Eyes: Conjunctivae and EOM are normal. No scleral icterus.  Neck: Neck supple. No thyromegaly present.  Cardiovascular: Normal rate, regular rhythm and normal heart sounds.  Exam reveals no gallop and no friction rub.   No murmur heard. Pulmonary/Chest: No stridor. He has no wheezes. He has rales (Left side.). He exhibits no tenderness.  Abdominal: Bowel sounds are normal. He exhibits no distension. There is no tenderness. There is no rebound.       Pelvis is stable.   Musculoskeletal: Normal range of motion. He exhibits no edema.       Abrasion on left knee 5 CM, abrasion on right knee 2 CM, 2+ dorsalis pedis pulse both feet. Tenderness back  left spine perispinal area.  Lymphadenopathy:    He has no cervical adenopathy.  Neurological: He is oriented to person, place, and time. Coordination normal.  Skin: No rash noted. No erythema.  Psychiatric: He has a normal mood and affect. His behavior is normal.    ED Course  Procedures (including critical care time)  DIAGNOSTIC STUDIES: Oxygen Saturation is 92% on room air, low by my interpretation.    COORDINATION OF CARE:    Results for orders placed during the hospital encounter of 09/24/11  CBC      Component Value Range   WBC 11.4 (*) 4.0 - 10.5 (K/uL)   RBC 3.30 (*) 4.22 - 5.81 (MIL/uL)   Hemoglobin 11.2 (*) 13.0 - 17.0 (g/dL)   HCT 96.0 (*) 45.4 - 52.0 (%)   MCV 101.8 (*) 78.0 - 100.0 (fL)   MCH 33.9  26.0 - 34.0 (pg)     MCHC 33.3  30.0 - 36.0 (g/dL)   RDW 09.8  11.9 - 14.7 (%)   Platelets 278  150 - 400 (K/uL)  BASIC METABOLIC PANEL      Component Value Range   Sodium 137  135 - 145 (mEq/L)   Potassium 4.5  3.5 - 5.1 (mEq/L)   Chloride 100  96 - 112 (mEq/L)   CO2 17 (*) 19 - 32 (mEq/L)   Glucose, Bld 115 (*) 70 - 99 (mg/dL)   BUN 9  6 - 23 (mg/dL)   Creatinine, Ser 8.29  0.50 - 1.35 (mg/dL)   Calcium 9.5  8.4 - 56.2 (mg/dL)   GFR calc non Af Amer >90  >90 (mL/min)   GFR calc Af Amer >90  >90 (mL/min)  URINALYSIS, ROUTINE W REFLEX MICROSCOPIC      Component Value Range   Color, Urine STRAW (*) YELLOW    APPearance CLEAR  CLEAR    Specific Gravity, Urine <1.005 (*) 1.005 - 1.030    pH 5.0  5.0 - 8.0    Glucose, UA NEGATIVE  NEGATIVE (mg/dL)   Hgb urine dipstick SMALL (*) NEGATIVE    Bilirubin Urine NEGATIVE  NEGATIVE    Ketones, ur NEGATIVE  NEGATIVE (mg/dL)   Protein, ur NEGATIVE  NEGATIVE (mg/dL)   Urobilinogen, UA 0.2  0.0 - 1.0 (mg/dL)   Nitrite NEGATIVE  NEGATIVE    Leukocytes, UA NEGATIVE  NEGATIVE   URINE MICROSCOPIC-ADD ON      Component Value Range   Squamous Epithelial / LPF RARE  RARE    WBC, UA 0-2  <3 (WBC/hpf)   RBC / HPF 0-2  <3 (RBC/hpf)   Bacteria, UA RARE  RARE    Casts HYALINE CASTS (*) NEGATIVE    Ct Head Wo Contrast  09/24/2011  *RADIOLOGY REPORT*  Clinical Data:  46 year old male - fall 20 feet out of tree.  Pain.  CT HEAD WITHOUT CONTRAST CT CERVICAL SPINE WITHOUT CONTRAST  Technique:  Multidetector CT imaging of the head and cervical spine was performed following the standard protocol without intravenous contrast.  Multiplanar CT image reconstructions of the cervical spine were also generated.  Comparison:  03/06/2008 head CT  CT HEAD  Findings: No acute intracranial abnormalities are identified, including mass lesion or mass effect, hydrocephalus, extra-axial fluid collection, midline shift, hemorrhage, or acute infarction.  The visualized bony calvarium is  unremarkable.  IMPRESSION: No evidence of acute abnormality.  CT CERVICAL SPINE  Findings: Normal alignment is noted. There is no evidence of fracture, subluxation or prevertebral soft tissue swelling. The  disc spaces are maintained. No focal bony lesions are present. The visualized soft tissue structures are unremarkable.  IMPRESSION: No static evidence of acute injury to the cervical spine.  Original Report Authenticated By: Rosendo Gros, M.D.   Ct Chest W Contrast  09/24/2011  *RADIOLOGY REPORT*  Clinical Data:  46 year old male with chest, abdominal and pelvic pain following a 20 feet fall.  CT CHEST, ABDOMEN AND PELVIS WITH CONTRAST  Technique:  Multidetector CT imaging of the chest, abdomen and pelvis was performed following the standard protocol during bolus administration of intravenous contrast.  Contrast:  100 ml intravenous Omnipaque-300.  Comparison:  04/21/2010 abdomen - pelvis CT and 09/24/2011 and prior chest radiographs.  CT CHEST  Findings:  Upper limits normal heart size identified. There is no evidence of mediastinal hematoma or pericardial effusion.  Fractures of the left 3rd-8th and 12th ribs are noted with tiny adjacent hemothorax and minuscule medial-anterior-basilar left pneumothorax. Minimal associated subcutaneous emphysema is noted. Bibasilar atelectasis is identified. Mild patchy airspace opacities within the right upper and lower lobes may represent contusions.  50% compression fractures of T8 and T10 are identified without bony retropulsion. Fractures of the left transverse processes of T8-T12 are noted. A nondisplaced sternal fracture is identified.  There is no evidence of enlarged lymph nodes, left pleural effusion or pericardial effusion. Moderate gynecomastia bilaterally is identified.  IMPRESSION: Fractures of the left 3rd through 8th and 12th ribs with tiny left pneumothorax and tiny hemothorax. Associated basilar atelectasis.  50% T8 and T10 compression fractures without  bony retropulsion. Associated left transverse fractures of T8-T12.  Nondisplaced sternal fracture.  Mild right lung airspace opacities - question contusions.  CT ABDOMEN AND PELVIS  Findings:  The liver, spleen, pancreas, kidneys, adrenal glands and gallbladder are unremarkable.  The bowel, bladder and appendix are unremarkable. No free fluid, enlarged lymph nodes, biliary dilation or abdominal aortic aneurysm identified. No acute bony abnormalities within the abdomen or pelvis are noted.  IMPRESSION: No evidence of acute abnormality within the abdomen or pelvis.  Original Report Authenticated By: Rosendo Gros, M.D.   Ct Cervical Spine Wo Contrast  09/24/2011  *RADIOLOGY REPORT*  Clinical Data:  46 year old male - fall 20 feet out of tree.  Pain.  CT HEAD WITHOUT CONTRAST CT CERVICAL SPINE WITHOUT CONTRAST  Technique:  Multidetector CT imaging of the head and cervical spine was performed following the standard protocol without intravenous contrast.  Multiplanar CT image reconstructions of the cervical spine were also generated.  Comparison:  03/06/2008 head CT  CT HEAD  Findings: No acute intracranial abnormalities are identified, including mass lesion or mass effect, hydrocephalus, extra-axial fluid collection, midline shift, hemorrhage, or acute infarction.  The visualized bony calvarium is unremarkable.  IMPRESSION: No evidence of acute abnormality.  CT CERVICAL SPINE  Findings: Normal alignment is noted. There is no evidence of fracture, subluxation or prevertebral soft tissue swelling. The disc spaces are maintained. No focal bony lesions are present. The visualized soft tissue structures are unremarkable.  IMPRESSION: No static evidence of acute injury to the cervical spine.  Original Report Authenticated By: Rosendo Gros, M.D.   Ct Abdomen Pelvis W Contrast  09/24/2011  *RADIOLOGY REPORT*  Clinical Data:  45 year old male with chest, abdominal and pelvic pain following a 20 feet fall.  CT CHEST,  ABDOMEN AND PELVIS WITH CONTRAST  Technique:  Multidetector CT imaging of the chest, abdomen and pelvis was performed following the standard protocol during bolus administration of intravenous contrast.  Contrast:  100 ml intravenous Omnipaque-300.  Comparison:  04/21/2010 abdomen - pelvis CT and 09/24/2011 and prior chest radiographs.  CT CHEST  Findings:  Upper limits normal heart size identified. There is no evidence of mediastinal hematoma or pericardial effusion.  Fractures of the left 3rd-8th and 12th ribs are noted with tiny adjacent hemothorax and minuscule medial-anterior-basilar left pneumothorax. Minimal associated subcutaneous emphysema is noted. Bibasilar atelectasis is identified. Mild patchy airspace opacities within the right upper and lower lobes may represent contusions.  50% compression fractures of T8 and T10 are identified without bony retropulsion. Fractures of the left transverse processes of T8-T12 are noted. A nondisplaced sternal fracture is identified.  There is no evidence of enlarged lymph nodes, left pleural effusion or pericardial effusion. Moderate gynecomastia bilaterally is identified.  IMPRESSION: Fractures of the left 3rd through 8th and 12th ribs with tiny left pneumothorax and tiny hemothorax. Associated basilar atelectasis.  50% T8 and T10 compression fractures without bony retropulsion. Associated left transverse fractures of T8-T12.  Nondisplaced sternal fracture.  Mild right lung airspace opacities - question contusions.  CT ABDOMEN AND PELVIS  Findings:  The liver, spleen, pancreas, kidneys, adrenal glands and gallbladder are unremarkable.  The bowel, bladder and appendix are unremarkable. No free fluid, enlarged lymph nodes, biliary dilation or abdominal aortic aneurysm identified. No acute bony abnormalities within the abdomen or pelvis are noted.  IMPRESSION: No evidence of acute abnormality within the abdomen or pelvis.  Original Report Authenticated By: Rosendo Gros,  M.D.   Dg Chest Portable 1 View  09/24/2011  *RADIOLOGY REPORT*  Clinical Data: The patient fell 20 feet from a tree tonight. Respiratory difficulty.  History of hypertension.  PORTABLE CHEST - 1 VIEW  Comparison: 03/03/2008  Findings: The patient is slightly rotated towards the right. However, the mediastinal width is prominent and irregular.  There is increased density in the right paratracheal region.  There is deformity of multiple left ribs, consistent with fractures.  No definite evidence for pneumothorax.  There is a vague density overlying the left hemithorax, raising the question of contusion.  IMPRESSION:  1.  Suspect multiple left rib fractures and possible contusion. 2.  Mediastinal width suggests possible mediastinal hematoma or may be accentuated by patient position and rotation. Aortic injury should be considered.  Further evaluation with CT of the chest with contrast is recommended.  Critical test results telephoned to Dr. Deretha Emory at the time of interpretation on date 09/24/2011 at time 8:43 p.m.  Original Report Authenticated By: Patterson Hammersmith, M.D.        1. Multiple trauma   2. Multiple rib fractures   3. Compression fracture of thoracic vertebra   4. Pulmonary contusion     8:14PM- EDP at bedside discusses treatment plan.    Date: 09/24/2011  Rate: 124  Rhythm: sinus tachycardia  QRS Axis: normal  Intervals: normal  ST/T Wave abnormalities: normal  Conduction Disutrbances:none and first-degree A-V block   Narrative Interpretation:   Old EKG Reviewed: unchanged No first degree AV block. No sniffing change in EKG from 10/29/2008.   CRITICAL CARE Performed by: Anthony Hoover.   Total critical care time: 33  Critical care time was exclusive of separately billable procedures and treating other patients.  Critical care was necessary to treat or prevent imminent or life-threatening deterioration.  Critical care was time spent personally by me on the  following activities: development of treatment plan with patient and/or surrogate as well as nursing, discussions with consultants, evaluation of patient's response  to treatment, examination of patient, obtaining history from patient or surrogate, ordering and performing treatments and interventions, ordering and review of laboratory studies, ordering and review of radiographic studies, pulse oximetry and re-evaluation of patient's condition.  MDM  Patient essentially level II trauma patient fell after he significant chest injuries blunt trauma. Multiple bur fractures on the left sternal fracture small hemothorax small pneumothorax 2 thoracic vertebrae compression fractures without retropulsion. Neurologically intact. CT of head neck abdomen and pelvis without significant findings.  Discussed with [facility] at [facility] Andrey Campanile excepting patient will go to the emergency department at [facility] via [facility] because [facility] truck is not currently available. Patient still with pain better controlled still slightly tachycardic oxygen saturation on 2 L of oxygen 96% currently. Patient treated as a trauma patient IV established fluids given oxygen provided chest x-ray done CT of head neck chest abdomen pelvis completed. Patient has received 2 mg total of Dilaudid for pain.      I personally performed the services described in this documentation, which was scribed in my presence. The recorded information has been reviewed and considered.     Anthony Jakes, MD 09/24/11 [id number]  Anthony Jakes, MD 09/28/11 [phone]

## 2011-09-24 NOTE — ED Notes (Signed)
Dr. Herma Carson in and cleared neck - C-Collar removed.

## 2011-09-24 NOTE — ED Notes (Signed)
Patients clothing removed for exam. Patient remains immobilized on spinal board provided by [facility].

## 2011-09-24 NOTE — ED Notes (Signed)
Monitor remains sinus tach without ectopy

## 2011-09-24 NOTE — ED Notes (Signed)
Offered to allow patients family back to see patient, patient denied wanting family in the room. Patient states "I don't want them to see me like this"

## 2011-09-25 ENCOUNTER — Emergency Department (HOSPITAL_COMMUNITY): Payer: Medicaid Other

## 2011-09-25 ENCOUNTER — Encounter (HOSPITAL_COMMUNITY): Payer: Self-pay | Admitting: *Deleted

## 2011-09-25 DIAGNOSIS — G8929 Other chronic pain: Secondary | ICD-10-CM | POA: Diagnosis present

## 2011-09-25 DIAGNOSIS — I10 Essential (primary) hypertension: Secondary | ICD-10-CM

## 2011-09-25 DIAGNOSIS — D62 Acute posthemorrhagic anemia: Secondary | ICD-10-CM | POA: Diagnosis present

## 2011-09-25 DIAGNOSIS — Z8673 Personal history of transient ischemic attack (TIA), and cerebral infarction without residual deficits: Secondary | ICD-10-CM | POA: Insufficient documentation

## 2011-09-25 DIAGNOSIS — Z720 Tobacco use: Secondary | ICD-10-CM | POA: Insufficient documentation

## 2011-09-25 DIAGNOSIS — S270XXA Traumatic pneumothorax, initial encounter: Secondary | ICD-10-CM

## 2011-09-25 DIAGNOSIS — S2242XA Multiple fractures of ribs, left side, initial encounter for closed fracture: Secondary | ICD-10-CM | POA: Diagnosis present

## 2011-09-25 DIAGNOSIS — S27321A Contusion of lung, unilateral, initial encounter: Secondary | ICD-10-CM | POA: Diagnosis present

## 2011-09-25 DIAGNOSIS — W14XXXA Fall from tree, initial encounter: Secondary | ICD-10-CM | POA: Diagnosis present

## 2011-09-25 DIAGNOSIS — S22060A Wedge compression fracture of T7-T8 vertebra, initial encounter for closed fracture: Secondary | ICD-10-CM | POA: Diagnosis present

## 2011-09-25 DIAGNOSIS — S2249XA Multiple fractures of ribs, unspecified side, initial encounter for closed fracture: Secondary | ICD-10-CM

## 2011-09-25 DIAGNOSIS — S2220XA Unspecified fracture of sternum, initial encounter for closed fracture: Secondary | ICD-10-CM | POA: Diagnosis present

## 2011-09-25 DIAGNOSIS — Z789 Other specified health status: Secondary | ICD-10-CM | POA: Diagnosis present

## 2011-09-25 DIAGNOSIS — S22070A Wedge compression fracture of T9-T10 vertebra, initial encounter for closed fracture: Secondary | ICD-10-CM | POA: Diagnosis present

## 2011-09-25 DIAGNOSIS — S27329A Contusion of lung, unspecified, initial encounter: Secondary | ICD-10-CM

## 2011-09-25 DIAGNOSIS — S22009A Unspecified fracture of unspecified thoracic vertebra, initial encounter for closed fracture: Secondary | ICD-10-CM | POA: Diagnosis present

## 2011-09-25 LAB — CBC
HCT: 28.6 % — ABNORMAL LOW (ref 39.0–52.0)
MCH: 33.3 pg (ref 26.0–34.0)
MCHC: 32.9 g/dL (ref 30.0–36.0)
MCV: 101.4 fL — ABNORMAL HIGH (ref 78.0–100.0)
RDW: 12.9 % (ref 11.5–15.5)

## 2011-09-25 LAB — BASIC METABOLIC PANEL
BUN: 8 mg/dL (ref 6–23)
CO2: 20 mEq/L (ref 19–32)
Calcium: 8.5 mg/dL (ref 8.4–10.5)
Chloride: 103 mEq/L (ref 96–112)
Creatinine, Ser: 0.69 mg/dL (ref 0.50–1.35)

## 2011-09-25 MED ORDER — ONDANSETRON HCL 4 MG PO TABS: 4.0000 mg | ORAL_TABLET | Freq: Four times a day (QID) | ORAL | Status: DC | PRN

## 2011-09-25 MED ORDER — METHADONE HCL 10 MG PO TABS
20.0000 mg | ORAL_TABLET | Freq: Three times a day (TID) | ORAL | Status: DC
  Administered 2011-09-25 – 2011-10-01 (×20): 20 mg via ORAL
  Filled 2011-09-25 (×3): qty 2
  Filled 2011-09-25: qty 1
  Filled 2011-09-25: qty 2
  Filled 2011-09-25: qty 1
  Filled 2011-09-25 (×2): qty 2
  Filled 2011-09-25 (×2): qty 1
  Filled 2011-09-25 (×12): qty 2

## 2011-09-25 MED ORDER — PANTOPRAZOLE SODIUM 40 MG IV SOLR
40.0000 mg | Freq: Every day | INTRAVENOUS | Status: DC
  Filled 2011-09-25: qty 40

## 2011-09-25 MED ORDER — NALOXONE HCL 0.4 MG/ML IJ SOLN: 0.4000 mg | INTRAMUSCULAR | Status: DC | PRN

## 2011-09-25 MED ORDER — LORAZEPAM 1 MG PO TABS: 1.0000 mg | ORAL_TABLET | Freq: Four times a day (QID) | ORAL | Status: AC | PRN

## 2011-09-25 MED ORDER — FOLIC ACID 1 MG PO TABS
1.0000 mg | ORAL_TABLET | Freq: Every day | ORAL | Status: DC
  Administered 2011-09-25 – 2011-10-01 (×7): 1 mg via ORAL
  Filled 2011-09-25 (×7): qty 1

## 2011-09-25 MED ORDER — LORAZEPAM 2 MG/ML IJ SOLN
1.0000 mg | Freq: Four times a day (QID) | INTRAMUSCULAR | Status: AC | PRN
  Administered 2011-09-25: 1 mg via INTRAVENOUS

## 2011-09-25 MED ORDER — FENTANYL CITRATE 0.05 MG/ML IJ SOLN
50.0000 ug | INTRAMUSCULAR | Status: DC | PRN
  Administered 2011-09-25: 100 ug via INTRAVENOUS
  Filled 2011-09-25: qty 2

## 2011-09-25 MED ORDER — POTASSIUM CHLORIDE IN NACL 20-0.9 MEQ/L-% IV SOLN
INTRAVENOUS | Status: DC
  Administered 2011-09-25: 04:00:00 via INTRAVENOUS
  Filled 2011-09-25 (×2): qty 1000

## 2011-09-25 MED ORDER — ADULT MULTIVITAMIN W/MINERALS CH
1.0000 | ORAL_TABLET | Freq: Every day | ORAL | Status: DC
  Administered 2011-09-25 – 2011-10-01 (×7): 1 via ORAL
  Filled 2011-09-25 (×7): qty 1

## 2011-09-25 MED ORDER — PANTOPRAZOLE SODIUM 40 MG PO TBEC: 40.0000 mg | DELAYED_RELEASE_TABLET | Freq: Every day | ORAL | Status: DC

## 2011-09-25 MED ORDER — HYDROMORPHONE 0.3 MG/ML IV SOLN
INTRAVENOUS | Status: AC
  Administered 2011-09-25: 11:00:00
  Filled 2011-09-25: qty 25

## 2011-09-25 MED ORDER — HYDROMORPHONE 0.3 MG/ML IV SOLN
INTRAVENOUS | Status: AC
  Administered 2011-09-25: 1.8 mg
  Administered 2011-09-26: 2.1 mg
  Administered 2011-09-26: 1.8 mg
  Filled 2011-09-25: qty 25

## 2011-09-25 MED ORDER — ENOXAPARIN SODIUM 40 MG/0.4ML ~~LOC~~ SOLN
40.0000 mg | Freq: Every day | SUBCUTANEOUS | Status: DC
  Administered 2011-09-26 – 2011-10-01 (×5): 40 mg via SUBCUTANEOUS
  Filled 2011-09-25 (×6): qty 0.4

## 2011-09-25 MED ORDER — SPIRITUS FRUMENTI
2.0000 | Freq: Two times a day (BID) | ORAL | Status: DC
  Administered 2011-09-25: 2 via ORAL
  Administered 2011-09-26 – 2011-09-27 (×2): 1 via ORAL
  Filled 2011-09-25 (×19): qty 2

## 2011-09-25 MED ORDER — ONDANSETRON HCL 4 MG/2ML IJ SOLN: 4.0000 mg | Freq: Four times a day (QID) | INTRAMUSCULAR | Status: DC | PRN

## 2011-09-25 MED ORDER — DIPHENHYDRAMINE HCL 50 MG/ML IJ SOLN: 12.5000 mg | Freq: Four times a day (QID) | INTRAMUSCULAR | Status: DC | PRN

## 2011-09-25 MED ORDER — ACETAMINOPHEN 10 MG/ML IV SOLN
1000.0000 mg | Freq: Four times a day (QID) | INTRAVENOUS | Status: AC
  Administered 2011-09-25 – 2011-09-26 (×4): 1000 mg via INTRAVENOUS
  Filled 2011-09-25 (×5): qty 100

## 2011-09-25 MED ORDER — KETOROLAC TROMETHAMINE 30 MG/ML IJ SOLN
30.0000 mg | Freq: Four times a day (QID) | INTRAMUSCULAR | Status: DC
  Administered 2011-09-25 (×2): 30 mg via INTRAVENOUS
  Filled 2011-09-25 (×6): qty 1

## 2011-09-25 MED ORDER — FENTANYL CITRATE 0.05 MG/ML IJ SOLN
INTRAMUSCULAR | Status: AC
  Filled 2011-09-25: qty 2

## 2011-09-25 MED ORDER — DIPHENHYDRAMINE HCL 12.5 MG/5ML PO ELIX
12.5000 mg | ORAL_SOLUTION | Freq: Four times a day (QID) | ORAL | Status: DC | PRN
  Filled 2011-09-25: qty 5

## 2011-09-25 MED ORDER — SODIUM CHLORIDE 0.9 % IJ SOLN: 9.0000 mL | INTRAMUSCULAR | Status: DC | PRN

## 2011-09-25 MED ORDER — INFLUENZA VIRUS VACC SPLIT PF IM SUSP
0.5000 mL | Freq: Once | INTRAMUSCULAR | Status: AC
  Administered 2011-09-25: 0.5 mL via INTRAMUSCULAR
  Filled 2011-09-25: qty 0.5

## 2011-09-25 MED ORDER — ASPIRIN EC 81 MG PO TBEC
81.0000 mg | DELAYED_RELEASE_TABLET | Freq: Every day | ORAL | Status: DC
  Administered 2011-09-25 – 2011-09-30 (×6): 81 mg via ORAL
  Filled 2011-09-25 (×8): qty 1

## 2011-09-25 MED ORDER — VITAMIN B-1 100 MG PO TABS
100.0000 mg | ORAL_TABLET | Freq: Every day | ORAL | Status: DC
  Administered 2011-09-25 – 2011-10-01 (×7): 100 mg via ORAL
  Filled 2011-09-25 (×7): qty 1

## 2011-09-25 MED ORDER — PANTOPRAZOLE SODIUM 40 MG PO TBEC
40.0000 mg | DELAYED_RELEASE_TABLET | Freq: Every day | ORAL | Status: DC
  Administered 2011-09-25 – 2011-10-01 (×5): 40 mg via ORAL
  Filled 2011-09-25 (×5): qty 1

## 2011-09-25 MED ORDER — FENTANYL CITRATE 0.05 MG/ML IJ SOLN
50.0000 ug | Freq: Once | INTRAMUSCULAR | Status: AC
  Administered 2011-09-25: 50 ug via INTRAVENOUS

## 2011-09-25 MED ORDER — HYDROMORPHONE 0.3 MG/ML IV SOLN
INTRAVENOUS | Status: DC
  Administered 2011-09-25: 22:00:00 via INTRAVENOUS
  Administered 2011-09-25: 1.8 mg via INTRAVENOUS
  Administered 2011-09-25: 1.2 mg via INTRAVENOUS
  Administered 2011-09-25: 2.7 mg via INTRAVENOUS
  Administered 2011-09-25: 11:00:00 via INTRAVENOUS
  Administered 2011-09-26: 2.4 mg via INTRAVENOUS
  Administered 2011-09-26: 5.1 mg via INTRAVENOUS
  Administered 2011-09-26: 07:00:00 via INTRAVENOUS
  Administered 2011-09-26: 3.48 mg via INTRAVENOUS
  Administered 2011-09-26: 1.8 mg via INTRAVENOUS
  Administered 2011-09-27: 1.5 mg via INTRAVENOUS
  Administered 2011-09-27: 0.6 mg via INTRAVENOUS
  Administered 2011-09-27: 0.3 mg via INTRAVENOUS
  Administered 2011-09-27: 0.6 mg via INTRAVENOUS
  Administered 2011-09-27: 2.38 mg via INTRAVENOUS
  Administered 2011-09-27: 0.3 mg via INTRAVENOUS
  Administered 2011-09-28: 0.9 mg via INTRAVENOUS
  Administered 2011-09-28: 0.3 mg via INTRAVENOUS
  Administered 2011-09-28 (×2): 1.2 mg via INTRAVENOUS
  Administered 2011-09-29: 0.6 mg via INTRAVENOUS
  Administered 2011-09-29: 12:00:00 via INTRAVENOUS
  Administered 2011-09-29 (×2): 0.3 mg via INTRAVENOUS
  Administered 2011-09-30: 0.6 mg via INTRAVENOUS
  Administered 2011-09-30: 0.3 mg via INTRAVENOUS

## 2011-09-25 MED ORDER — FENTANYL CITRATE 0.05 MG/ML IJ SOLN
INTRAMUSCULAR | Status: AC
  Administered 2011-09-25: 50 ug via INTRAVENOUS
  Filled 2011-09-25: qty 2

## 2011-09-25 MED ORDER — SODIUM CHLORIDE 0.9 % IV SOLN
INTRAVENOUS | Status: DC
  Administered 2011-09-25: 10 mL/h via INTRAVENOUS
  Administered 2011-09-29: 16:00:00 via INTRAVENOUS

## 2011-09-25 MED ORDER — FENTANYL CITRATE 0.05 MG/ML IJ SOLN
INTRAMUSCULAR | Status: AC
  Administered 2011-09-25: 100 ug
  Filled 2011-09-25: qty 2

## 2011-09-25 MED ORDER — FENTANYL CITRATE 0.05 MG/ML IJ SOLN
75.0000 ug | Freq: Once | INTRAMUSCULAR | Status: AC
  Administered 2011-09-25: 75 ug via INTRAVENOUS

## 2011-09-25 MED ORDER — LORAZEPAM 2 MG/ML IJ SOLN
INTRAMUSCULAR | Status: AC
  Filled 2011-09-25: qty 1

## 2011-09-25 NOTE — Progress Notes (Signed)
Called by Violeta Gelinas to reeval Tspine injury Discussed scan with Lamke -T8 fx extends to post elements making it burst fx and potentially unstable, T10 also burst fx - not compression fx Have consulted Dr. Jeral Fruit Hold off on mobilization until NSU dispo

## 2011-09-25 NOTE — Evaluation (Signed)
Physical Therapy Evaluation Patient Details Name: Anthony Hoover MRN: [id number] DOB: [birth year] Today's Date: 09/25/2011  Problem List:  Patient Active Problem List  Diagnoses  . Fall from tree  . Multiple fractures of ribs of left side  . Right pulmonary contusion  . Hypertension  . Chronic back pain  . Wedge compression fracture of T8 vertebra  . Wedge compression fracture of T10 vertebra  . Sternal fracture  . Fracture of transverse process of thoracic vertebra  . Heavy alcohol use  . Acute blood loss anemia  . History of CVA (cerebrovascular accident)  . Tobacco use    Past Medical History:  Past Medical History  Diagnosis Date  . Hypertension   . Back pain    Past Surgical History:  Past Surgical History  Procedure Date  . Umbilical hernia repair     PT Assessment/Plan/Recommendation PT Assessment Clinical Impression Statement: Pt admitted after fall from tree while cutting limbs. with T8-12 fx, Left rib fx, sternal fx. Ordered by Trauma and Dr.Dean for OOB with brace. Performed eval with donning brace in supine and pt up in chair 20 min when Dr.Dean called and told the RN to return pt to bed. Performed transers back to bed and brace removal with nursing per MD request. Will await further guidance for appropriateness of PT based on Dr.Dean's request for neuro consult. If no changes to pt status and orders will continue to follow to maximize mobility.  PT Recommendation/Assessment: Patient will need skilled PT in the acute care venue PT Problem List: Decreased strength;Decreased mobility;Decreased activity tolerance;Pain;Decreased knowledge of precautions;Decreased knowledge of use of DME Barriers to Discharge: Decreased caregiver support PT Therapy Diagnosis : Difficulty walking;Acute pain PT Plan PT Frequency: Min 5X/week PT Treatment/Interventions: DME instruction;Gait training;Functional mobility training;Therapeutic exercise;Therapeutic activities;Patient/family  education PT Recommendation Recommendations for Other Services: OT consult Follow Up Recommendations: Skilled nursing facility vs Home health PT with Supervision for mobility/OOB (pending pt improvement ) Equipment Recommended: Rolling walker with 5" wheels PT Goals  Acute Rehab PT Goals PT Goal Formulation: With patient Time For Goal Achievement: 2 weeks Pt will Roll Supine to Right Side: with supervision PT Goal: Rolling Supine to Right Side - Progress: Goal set today Pt will go Supine/Side to Sit: with supervision PT Goal: Supine/Side to Sit - Progress: Goal set today Pt will go Sit to Supine/Side: with supervision PT Goal: Sit to Supine/Side - Progress: Goal set today Pt will go Sit to Stand: with min assist PT Goal: Sit to Stand - Progress: Goal set today Pt will go Stand to Sit: with min assist PT Goal: Stand to Sit - Progress: Goal set today Pt will Ambulate: 51 - 150 feet;with least restrictive assistive device;with supervision PT Goal: Ambulate - Progress: Goal set today Pt will Go Up / Down Stairs: 1-2 stairs;with min assist;with least restrictive assistive device PT Goal: Up/Down Stairs - Progress: Goal set today Additional Goals Additional Goal #1: Pt will independently state and demonstrate adherence to 3/3 back precautions and verbalize brace wear. PT Goal: Additional Goal #1 - Progress: Goal set today Additional Goal #2: Pt will independently state and demonstrate adherence to 3/3 sternal precautions PT Goal: Additional Goal #2 - Progress: Goal set today  PT Evaluation Precautions/Restrictions  Precautions Precautions: Sternal;Fall;Back Precaution Booklet Issued: Yes (comment) Precaution Comments: Provided sternal precaution and back precaution sheet Required Braces or Orthoses: Yes Spinal Brace: Thoracolumbosacral orthotic;Applied in supine position (not specified so assumed conservative donning) Restrictions Weight Bearing Restrictions: No Prior Functioning  Home Living Lives With: Daughter;Other (Comment) (mother) Type of Home: House Home Layout: One level Home Access: Stairs to enter [employer] of Steps: 2 Bathroom Shower/Tub: Tub/shower unit;Walk-in shower;Curtain Bathroom Toilet: Standard Home Adaptive Equipment: Straight cane Prior Function Level of Independence: Independent with basic ADLs;Independent with homemaking with wheelchair;Independent with transfers;Independent with gait Driving: Yes Vocation: Unemployed Comments: Dgtr is in school and mother at home but mother cannot provide greater than supervision for mobility Cognition Cognition Arousal/Alertness: Awake/alert Overall Cognitive Status: Appears within functional limits for tasks assessed Sensation/Coordination Sensation Light Touch: Not tested Extremity Assessment   Mobility (including Balance) Bed Mobility Bed Mobility: Yes Rolling Right: 3: Mod assist Rolling Right Details (indicate cue type and reason): cueing for log roll and maintaining all precautions Rolling Left: 3: Mod assist Rolling Left Details (indicate cue type and reason): cueing for log roll and maintaining all precautions Right Sidelying to Sit: HOB flat;1: +2 Total assist;Patient percentage (comment) (pt= 40%) Right Sidelying to Sit Details (indicate cue type and reason): assist for bil LE off EOB and to elevate trunk from surface Sit to Sidelying Right: 3: Mod assist;HOB flat Sit to Sidelying Right Details (indicate cue type and reason): assist to bring legs into bed and control trunk to surface with VC throughout for breathing Transfers Transfers: Yes Sit to Stand: 3: Mod assist;From bed;From chair/3-in-1 Sit to Stand Details (indicate cue type and reason): cueing for hands on thighs to protect sternum and assist for anterior translation Stand to Sit: 4: Min assist;To bed;To chair/3-in-1 Stand to Sit Details: cueing for precautions and safety Stand Pivot Transfers: 3: Mod  assist Stand Pivot Transfer Details (indicate cue type and reason): use of RW with assist to direct and turn RW.  Ambulation/Gait Ambulation/Gait: No Stairs: No  Posture/Postural Control Posture/Postural Control: No significant limitations Balance Balance Assessed: No Exercise    End of Session PT - End of Session Equipment Utilized During Treatment: Gait belt;Back brace Activity Tolerance: Patient limited by pain Patient left: in bed;with call bell in reach Nurse Communication: Mobility status for transfers General Behavior During Session: [facility] for tasks performed Cognition: [facility] for tasks performed  Delorse Lek 09/25/2011, 4:43 PM  Delaney Meigs, PT [phone]

## 2011-09-25 NOTE — ED Provider Notes (Signed)
History     CSN: [id number]  Arrival date & time 09/24/11  7829   First MD Initiated Contact with Patient 09/24/11 1956      Chief Complaint  Patient presents with  . Fall    fell 20 feet out of tree    (Consider location/radiation/quality/duration/timing/severity/associated sxs/prior treatment) HPI Comments: Patient is transferred here from any [facility] emergency [facility] after a fall from a very tall height. The patient apparently was trying to cut some branches had gone up a ladder and then further up into the tree and lost his footing and fell. The patient admits to drinking alcohol earlier today. After review of his test, and it seems that the patient does have multiple rib fractures, thoracic vertebral fractures and a pneumothorax with associated hemothorax. The patient complains primarily of pain in his back. He currently did receive several doses of IV Dilaudid in the patient reports that he does not have significant relief. No problems in transit according to EMS report to RN.  Patient denies abdominal pain. He denies loss of consciousness. Denies any neck pain. Level 5 caveat due to urgent need for intervention.  Patient is a 46 y.o. male presenting with fall. The history is provided by the patient and medical records.  Fall    Past Medical History  Diagnosis Date  . Hypertension   . Back pain     Past Surgical History  Procedure Date  . Umbilical hernia repair     History reviewed. No pertinent family history.  History  Substance Use Topics  . Smoking status: Current Everyday Smoker -- 0.5 packs/day    Types: Cigarettes  . Smokeless tobacco: Not on file  . Alcohol Use: 9.0 - 12.0 oz/week    15-20 Cans of beer per week      Review of Systems  Unable to perform ROS: Other    Allergies  Review of patient's allergies indicates no known allergies.  Home Medications   Current Outpatient Rx  Name Route Sig Dispense Refill  . ALPRAZOLAM 1 MG PO TABS Oral Take  1 mg by mouth at bedtime as needed.    . ASPIRIN EC 81 MG PO TBEC Oral Take 81 mg by mouth at bedtime.     Marland Kitchen LANSOPRAZOLE 30 MG PO CPDR Oral Take 30 mg by mouth daily as needed. For acid reflux    . METHADONE HCL 10 MG PO TABS Oral Take 20 mg by mouth 3 (three) times daily.     Marland Kitchen VIAGRA PO Oral Take 0.5-1 tablets by mouth as needed. For intercourse      BP 137/92  Pulse 115  Temp(Src) 98.3 F (36.8 C) (Oral)  Resp 28  Ht 5\' 10"  (1.778 m)  Wt 190 lb (86.183 kg)  BMI 27.26 kg/m2  SpO2 97%  Physical Exam  Nursing note and vitals reviewed. Constitutional: He appears well-developed and well-nourished. He appears distressed.  HENT:  Head: Normocephalic.  Eyes: Pupils are equal, round, and reactive to light.  Neck: Normal range of motion and phonation normal. No tracheal tenderness and no spinous process tenderness present. No tracheal deviation present.  Cardiovascular: S1 normal and S2 normal.  Tachycardia present.   No murmur heard. Pulmonary/Chest: No stridor.  Abdominal: Soft. Normal appearance and bowel sounds are normal. There is no tenderness. There is no rebound, no guarding and no CVA tenderness.  Musculoskeletal:       Thoracic back: He exhibits tenderness and bony tenderness.  Neurological: No sensory deficit.  5/5 distal plantar and dorsiflex of ankles bilaterally  Skin: Skin is warm and dry. No abrasion noted.  Psychiatric: His speech is normal. His mood appears anxious. His affect is not inappropriate.    ED Course  Procedures (including critical care time)  Labs Reviewed  CBC - Abnormal; Notable for the following:    WBC 11.4 (*)    RBC 3.30 (*)    Hemoglobin 11.2 (*)    HCT 33.6 (*)    MCV 101.8 (*)    All other components within normal limits  BASIC METABOLIC PANEL - Abnormal; Notable for the following:    CO2 17 (*)    Glucose, Bld 115 (*)    All other components within normal limits  URINALYSIS, ROUTINE W REFLEX MICROSCOPIC - Abnormal; Notable for  the following:    Color, Urine STRAW (*)    Specific Gravity, Urine <1.005 (*)    Hgb urine dipstick SMALL (*)    All other components within normal limits  URINE MICROSCOPIC-ADD ON - Abnormal; Notable for the following:    Casts HYALINE CASTS (*)    All other components within normal limits   Ct Head Wo Contrast  09/24/2011  *RADIOLOGY REPORT*  Clinical Data:  46 year old male - fall 20 feet out of tree.  Pain.  CT HEAD WITHOUT CONTRAST CT CERVICAL SPINE WITHOUT CONTRAST  Technique:  Multidetector CT imaging of the head and cervical spine was performed following the standard protocol without intravenous contrast.  Multiplanar CT image reconstructions of the cervical spine were also generated.  Comparison:  03/06/2008 head CT  CT HEAD  Findings: No acute intracranial abnormalities are identified, including mass lesion or mass effect, hydrocephalus, extra-axial fluid collection, midline shift, hemorrhage, or acute infarction.  The visualized bony calvarium is unremarkable.  IMPRESSION: No evidence of acute abnormality.  CT CERVICAL SPINE  Findings: Normal alignment is noted. There is no evidence of fracture, subluxation or prevertebral soft tissue swelling. The disc spaces are maintained. No focal bony lesions are present. The visualized soft tissue structures are unremarkable.  IMPRESSION: No static evidence of acute injury to the cervical spine.  Original Report Authenticated By: Rosendo Gros, M.D.   Ct Chest W Contrast  09/24/2011  *RADIOLOGY REPORT*  Clinical Data:  46 year old male with chest, abdominal and pelvic pain following a 20 feet fall.  CT CHEST, ABDOMEN AND PELVIS WITH CONTRAST  Technique:  Multidetector CT imaging of the chest, abdomen and pelvis was performed following the standard protocol during bolus administration of intravenous contrast.  Contrast:  100 ml intravenous Omnipaque-300.  Comparison:  04/21/2010 abdomen - pelvis CT and 09/24/2011 and prior chest radiographs.  CT CHEST   Findings:  Upper limits normal heart size identified. There is no evidence of mediastinal hematoma or pericardial effusion.  Fractures of the left 3rd-8th and 12th ribs are noted with tiny adjacent hemothorax and minuscule medial-anterior-basilar left pneumothorax. Minimal associated subcutaneous emphysema is noted. Bibasilar atelectasis is identified. Mild patchy airspace opacities within the right upper and lower lobes may represent contusions.  50% compression fractures of T8 and T10 are identified without bony retropulsion. Fractures of the left transverse processes of T8-T12 are noted. A nondisplaced sternal fracture is identified.  There is no evidence of enlarged lymph nodes, left pleural effusion or pericardial effusion. Moderate gynecomastia bilaterally is identified.  IMPRESSION: Fractures of the left 3rd through 8th and 12th ribs with tiny left pneumothorax and tiny hemothorax. Associated basilar atelectasis.  50% T8 and T10 compression fractures  without bony retropulsion. Associated left transverse fractures of T8-T12.  Nondisplaced sternal fracture.  Mild right lung airspace opacities - question contusions.  CT ABDOMEN AND PELVIS  Findings:  The liver, spleen, pancreas, kidneys, adrenal glands and gallbladder are unremarkable.  The bowel, bladder and appendix are unremarkable. No free fluid, enlarged lymph nodes, biliary dilation or abdominal aortic aneurysm identified. No acute bony abnormalities within the abdomen or pelvis are noted.  IMPRESSION: No evidence of acute abnormality within the abdomen or pelvis.  Original Report Authenticated By: Rosendo Gros, M.D.   Ct Cervical Spine Wo Contrast  09/24/2011  *RADIOLOGY REPORT*  Clinical Data:  46 year old male - fall 20 feet out of tree.  Pain.  CT HEAD WITHOUT CONTRAST CT CERVICAL SPINE WITHOUT CONTRAST  Technique:  Multidetector CT imaging of the head and cervical spine was performed following the standard protocol without intravenous contrast.   Multiplanar CT image reconstructions of the cervical spine were also generated.  Comparison:  03/06/2008 head CT  CT HEAD  Findings: No acute intracranial abnormalities are identified, including mass lesion or mass effect, hydrocephalus, extra-axial fluid collection, midline shift, hemorrhage, or acute infarction.  The visualized bony calvarium is unremarkable.  IMPRESSION: No evidence of acute abnormality.  CT CERVICAL SPINE  Findings: Normal alignment is noted. There is no evidence of fracture, subluxation or prevertebral soft tissue swelling. The disc spaces are maintained. No focal bony lesions are present. The visualized soft tissue structures are unremarkable.  IMPRESSION: No static evidence of acute injury to the cervical spine.  Original Report Authenticated By: Rosendo Gros, M.D.   Ct Abdomen Pelvis W Contrast  09/24/2011  *RADIOLOGY REPORT*  Clinical Data:  46 year old male with chest, abdominal and pelvic pain following a 20 feet fall.  CT CHEST, ABDOMEN AND PELVIS WITH CONTRAST  Technique:  Multidetector CT imaging of the chest, abdomen and pelvis was performed following the standard protocol during bolus administration of intravenous contrast.  Contrast:  100 ml intravenous Omnipaque-300.  Comparison:  04/21/2010 abdomen - pelvis CT and 09/24/2011 and prior chest radiographs.  CT CHEST  Findings:  Upper limits normal heart size identified. There is no evidence of mediastinal hematoma or pericardial effusion.  Fractures of the left 3rd-8th and 12th ribs are noted with tiny adjacent hemothorax and minuscule medial-anterior-basilar left pneumothorax. Minimal associated subcutaneous emphysema is noted. Bibasilar atelectasis is identified. Mild patchy airspace opacities within the right upper and lower lobes may represent contusions.  50% compression fractures of T8 and T10 are identified without bony retropulsion. Fractures of the left transverse processes of T8-T12 are noted. A nondisplaced sternal  fracture is identified.  There is no evidence of enlarged lymph nodes, left pleural effusion or pericardial effusion. Moderate gynecomastia bilaterally is identified.  IMPRESSION: Fractures of the left 3rd through 8th and 12th ribs with tiny left pneumothorax and tiny hemothorax. Associated basilar atelectasis.  50% T8 and T10 compression fractures without bony retropulsion. Associated left transverse fractures of T8-T12.  Nondisplaced sternal fracture.  Mild right lung airspace opacities - question contusions.  CT ABDOMEN AND PELVIS  Findings:  The liver, spleen, pancreas, kidneys, adrenal glands and gallbladder are unremarkable.  The bowel, bladder and appendix are unremarkable. No free fluid, enlarged lymph nodes, biliary dilation or abdominal aortic aneurysm identified. No acute bony abnormalities within the abdomen or pelvis are noted.  IMPRESSION: No evidence of acute abnormality within the abdomen or pelvis.  Original Report Authenticated By: Rosendo Gros, M.D.   Dg Chest Portable 1  View  09/24/2011  *RADIOLOGY REPORT*  Clinical Data: The patient fell 20 feet from a tree tonight. Respiratory difficulty.  History of hypertension.  PORTABLE CHEST - 1 VIEW  Comparison: 03/03/2008  Findings: The patient is slightly rotated towards the right. However, the mediastinal width is prominent and irregular.  There is increased density in the right paratracheal region.  There is deformity of multiple left ribs, consistent with fractures.  No definite evidence for pneumothorax.  There is a vague density overlying the left hemithorax, raising the question of contusion.  IMPRESSION:  1.  Suspect multiple left rib fractures and possible contusion. 2.  Mediastinal width suggests possible mediastinal hematoma or may be accentuated by patient position and rotation. Aortic injury should be considered.  Further evaluation with CT of the chest with contrast is recommended.  Critical test results telephoned to Dr. Deretha Emory at  the time of interpretation on date 09/24/2011 at time 8:43 p.m.  Original Report Authenticated By: Patterson Hammersmith, M.D.     1. Multiple trauma   2. Multiple rib fractures   3. Compression fracture of thoracic vertebra   4. Pulmonary contusion   5. Sternal fracture       MDM  I revivewed results from prior ED visit.  Dr. Andrey Campanile notified and he arrived to care for patient shortly after arrival.  Will repeat PCXR to ensure no change in PTX, sats are normal, is tachycardic and tachypneic, but I think due to pain.          Gavin Pound. Oletta Lamas, MD 09/25/11 [id number]

## 2011-09-25 NOTE — Progress Notes (Signed)
Watch for DTs Monia Sabal and CIWA protocol Pulmonary toilet Patient examined and I agree with the assessment and plan  Violeta Gelinas, MD, MPH, FACS Pager: [phone]  09/25/2011 8:44 AM

## 2011-09-25 NOTE — ED Notes (Signed)
Pt log rolled- no lacerations or skin issues noted at this time

## 2011-09-25 NOTE — ED Notes (Signed)
Attempt to call report x 1.  RN unable. 

## 2011-09-25 NOTE — ED Notes (Signed)
Dr Andrey Campanile at bedside to discuss plan of care with pt. Anthony Hoover also at bedside

## 2011-09-25 NOTE — H&P (Signed)
Anthony Hoover is an 46 y.o. male.   Chief Complaint: fall, level 2 HPI: 45yo WM s/p 20 feet fall from [facility] earlier this evening. He was in a tree cutting limbs and fell. He denies LOC. He complains of back pain and pain breathing. He was taken to [facility] where he was evaluated with a cxr and multiple CTs. He was transferred here for definitive care. He denies extremity pain. He denies any numbness/tingling in extremities. He drinks 15-20 beers/day.   Past Medical History  Diagnosis Date  . Hypertension   . Back pain     Past Surgical History  Procedure Date  . Umbilical hernia repair     History reviewed. No pertinent family history. Social History:  reports that he has been smoking Cigarettes.  He has been smoking about .5 packs per day. He does not have any smokeless tobacco history on file. He reports that he drinks about 9 - 12 ounces of alcohol per week. He reports that he does not use illicit drugs.  Allergies: No Known Allergies  Medications Prior to Admission  Medication Dose Route Frequency Provider Last Rate Last Dose  . 0.9 %  sodium chloride infusion   Intravenous Continuous Shelda Jakes, MD 100 mL/hr at 09/25/11 0006    . fentaNYL (SUBLIMAZE) injection 50 mcg  50 mcg Intravenous Once Atilano Ina, MD,FACS   50 mcg at 09/25/11 0001  . HYDROmorphone (DILAUDID) 1 MG/ML injection        1 mg at 09/24/11 2114  . HYDROmorphone (DILAUDID) injection 1 mg  1 mg Intravenous Once Shelda Jakes, MD   1 mg at 09/24/11 2022  . HYDROmorphone (DILAUDID) injection 1 mg  1 mg Intravenous Once Shelda Jakes, MD   1 mg at 09/24/11 2302  . iohexol (OMNIPAQUE) 300 MG/ML solution 100 mL  100 mL Intravenous Once PRN Medication Radiologist, MD   100 mL at 09/24/11 2111  . ondansetron (ZOFRAN) injection 4 mg  4 mg Intravenous Once Shelda Jakes, MD   4 mg at 09/24/11 2019  . sodium chloride 0.9 % bolus 250 mL  250 mL Intravenous Once Shelda Jakes, MD      . DISCONTD:  morphine 4 MG/ML injection 4 mg  4 mg Intravenous Once Gavin Pound. Ghim, MD       No current outpatient prescriptions on file as of 09/24/2011.   Reports he takes methadone, xanax, and a BP pill every day Results for orders placed during the hospital encounter of 09/24/11 (from the past 48 hour(s))  CBC     Status: Abnormal   Collection Time   09/24/11  8:14 PM      Component Value Range Comment   WBC 11.4 (*) 4.0 - 10.5 (K/uL)    RBC 3.30 (*) 4.22 - 5.81 (MIL/uL)    Hemoglobin 11.2 (*) 13.0 - 17.0 (g/dL)    HCT 09.8 (*) 11.9 - 52.0 (%)    MCV 101.8 (*) 78.0 - 100.0 (fL)    MCH 33.9  26.0 - 34.0 (pg)    MCHC 33.3  30.0 - 36.0 (g/dL)    RDW 14.7  82.9 - 56.2 (%)    Platelets 278  150 - 400 (K/uL)   BASIC METABOLIC PANEL     Status: Abnormal   Collection Time   09/24/11  8:14 PM      Component Value Range Comment   Sodium 137  135 - 145 (mEq/L)    Potassium  4.5  3.5 - 5.1 (mEq/L)    Chloride 100  96 - 112 (mEq/L)    CO2 17 (*) 19 - 32 (mEq/L)    Glucose, Bld 115 (*) 70 - 99 (mg/dL)    BUN 9  6 - 23 (mg/dL)    Creatinine, Ser 1.61  0.50 - 1.35 (mg/dL)    Calcium 9.5  8.4 - 10.5 (mg/dL)    GFR calc non Af Amer >90  >90 (mL/min)    GFR calc Af Amer >90  >90 (mL/min)   URINALYSIS, ROUTINE W REFLEX MICROSCOPIC     Status: Abnormal   Collection Time   09/24/11  9:46 PM      Component Value Range Comment   Color, Urine STRAW (*) YELLOW     APPearance CLEAR  CLEAR     Specific Gravity, Urine <1.005 (*) 1.005 - 1.030     pH 5.0  5.0 - 8.0     Glucose, UA NEGATIVE  NEGATIVE (mg/dL)    Hgb urine dipstick SMALL (*) NEGATIVE     Bilirubin Urine NEGATIVE  NEGATIVE     Ketones, ur NEGATIVE  NEGATIVE (mg/dL)    Protein, ur NEGATIVE  NEGATIVE (mg/dL)    Urobilinogen, UA 0.2  0.0 - 1.0 (mg/dL)    Nitrite NEGATIVE  NEGATIVE     Leukocytes, UA NEGATIVE  NEGATIVE    URINE MICROSCOPIC-ADD ON     Status: Abnormal   Collection Time   09/24/11  9:46 PM      Component Value Range Comment    Squamous Epithelial / LPF RARE  RARE     WBC, UA 0-2  <3 (WBC/hpf)    RBC / HPF 0-2  <3 (RBC/hpf)    Bacteria, UA RARE  RARE     Casts HYALINE CASTS (*) NEGATIVE     Ct Head Wo Contrast  09/24/2011  *RADIOLOGY REPORT*  Clinical Data:  46 year old male - fall 20 feet out of tree.  Pain.  CT HEAD WITHOUT CONTRAST CT CERVICAL SPINE WITHOUT CONTRAST  Technique:  Multidetector CT imaging of the head and cervical spine was performed following the standard protocol without intravenous contrast.  Multiplanar CT image reconstructions of the cervical spine were also generated.  Comparison:  03/06/2008 head CT  CT HEAD  Findings: No acute intracranial abnormalities are identified, including mass lesion or mass effect, hydrocephalus, extra-axial fluid collection, midline shift, hemorrhage, or acute infarction.  The visualized bony calvarium is unremarkable.  IMPRESSION: No evidence of acute abnormality.  CT CERVICAL SPINE  Findings: Normal alignment is noted. There is no evidence of fracture, subluxation or prevertebral soft tissue swelling. The disc spaces are maintained. No focal bony lesions are present. The visualized soft tissue structures are unremarkable.  IMPRESSION: No static evidence of acute injury to the cervical spine.  Original Report Authenticated By: Rosendo Gros, M.D.   Ct Chest W Contrast  09/24/2011  *RADIOLOGY REPORT*  Clinical Data:  46 year old male with chest, abdominal and pelvic pain following a 20 feet fall.  CT CHEST, ABDOMEN AND PELVIS WITH CONTRAST  Technique:  Multidetector CT imaging of the chest, abdomen and pelvis was performed following the standard protocol during bolus administration of intravenous contrast.  Contrast:  100 ml intravenous Omnipaque-300.  Comparison:  04/21/2010 abdomen - pelvis CT and 09/24/2011 and prior chest radiographs.  CT CHEST  Findings:  Upper limits normal heart size identified. There is no evidence of mediastinal hematoma or pericardial effusion.   Fractures of the left  3rd-8th and 12th ribs are noted with tiny adjacent hemothorax and minuscule medial-anterior-basilar left pneumothorax. Minimal associated subcutaneous emphysema is noted. Bibasilar atelectasis is identified. Mild patchy airspace opacities within the right upper and lower lobes may represent contusions.  50% compression fractures of T8 and T10 are identified without bony retropulsion. Fractures of the left transverse processes of T8-T12 are noted. A nondisplaced sternal fracture is identified.  There is no evidence of enlarged lymph nodes, left pleural effusion or pericardial effusion. Moderate gynecomastia bilaterally is identified.  IMPRESSION: Fractures of the left 3rd through 8th and 12th ribs with tiny left pneumothorax and tiny hemothorax. Associated basilar atelectasis.  50% T8 and T10 compression fractures without bony retropulsion. Associated left transverse fractures of T8-T12.  Nondisplaced sternal fracture.  Mild right lung airspace opacities - question contusions.  CT ABDOMEN AND PELVIS  Findings:  The liver, spleen, pancreas, kidneys, adrenal glands and gallbladder are unremarkable.  The bowel, bladder and appendix are unremarkable. No free fluid, enlarged lymph nodes, biliary dilation or abdominal aortic aneurysm identified. No acute bony abnormalities within the abdomen or pelvis are noted.  IMPRESSION: No evidence of acute abnormality within the abdomen or pelvis.  Original Report Authenticated By: Rosendo Gros, M.D.   Ct Cervical Spine Wo Contrast  09/24/2011  *RADIOLOGY REPORT*  Clinical Data:  46 year old male - fall 20 feet out of tree.  Pain.  CT HEAD WITHOUT CONTRAST CT CERVICAL SPINE WITHOUT CONTRAST  Technique:  Multidetector CT imaging of the head and cervical spine was performed following the standard protocol without intravenous contrast.  Multiplanar CT image reconstructions of the cervical spine were also generated.  Comparison:  03/06/2008 head CT  CT HEAD   Findings: No acute intracranial abnormalities are identified, including mass lesion or mass effect, hydrocephalus, extra-axial fluid collection, midline shift, hemorrhage, or acute infarction.  The visualized bony calvarium is unremarkable.  IMPRESSION: No evidence of acute abnormality.  CT CERVICAL SPINE  Findings: Normal alignment is noted. There is no evidence of fracture, subluxation or prevertebral soft tissue swelling. The disc spaces are maintained. No focal bony lesions are present. The visualized soft tissue structures are unremarkable.  IMPRESSION: No static evidence of acute injury to the cervical spine.  Original Report Authenticated By: Rosendo Gros, M.D.   Ct Abdomen Pelvis W Contrast  09/24/2011  *RADIOLOGY REPORT*  Clinical Data:  46 year old male with chest, abdominal and pelvic pain following a 20 feet fall.  CT CHEST, ABDOMEN AND PELVIS WITH CONTRAST  Technique:  Multidetector CT imaging of the chest, abdomen and pelvis was performed following the standard protocol during bolus administration of intravenous contrast.  Contrast:  100 ml intravenous Omnipaque-300.  Comparison:  04/21/2010 abdomen - pelvis CT and 09/24/2011 and prior chest radiographs.  CT CHEST  Findings:  Upper limits normal heart size identified. There is no evidence of mediastinal hematoma or pericardial effusion.  Fractures of the left 3rd-8th and 12th ribs are noted with tiny adjacent hemothorax and minuscule medial-anterior-basilar left pneumothorax. Minimal associated subcutaneous emphysema is noted. Bibasilar atelectasis is identified. Mild patchy airspace opacities within the right upper and lower lobes may represent contusions.  50% compression fractures of T8 and T10 are identified without bony retropulsion. Fractures of the left transverse processes of T8-T12 are noted. A nondisplaced sternal fracture is identified.  There is no evidence of enlarged lymph nodes, left pleural effusion or pericardial effusion. Moderate  gynecomastia bilaterally is identified.  IMPRESSION: Fractures of the left 3rd through 8th and 12th ribs  with tiny left pneumothorax and tiny hemothorax. Associated basilar atelectasis.  50% T8 and T10 compression fractures without bony retropulsion. Associated left transverse fractures of T8-T12.  Nondisplaced sternal fracture.  Mild right lung airspace opacities - question contusions.  CT ABDOMEN AND PELVIS  Findings:  The liver, spleen, pancreas, kidneys, adrenal glands and gallbladder are unremarkable.  The bowel, bladder and appendix are unremarkable. No free fluid, enlarged lymph nodes, biliary dilation or abdominal aortic aneurysm identified. No acute bony abnormalities within the abdomen or pelvis are noted.  IMPRESSION: No evidence of acute abnormality within the abdomen or pelvis.  Original Report Authenticated By: Rosendo Gros, M.D.   Dg Chest Portable 1 View  09/24/2011  *RADIOLOGY REPORT*  Clinical Data: The patient fell 20 feet from a tree tonight. Respiratory difficulty.  History of hypertension.  PORTABLE CHEST - 1 VIEW  Comparison: 03/03/2008  Findings: The patient is slightly rotated towards the right. However, the mediastinal width is prominent and irregular.  There is increased density in the right paratracheal region.  There is deformity of multiple left ribs, consistent with fractures.  No definite evidence for pneumothorax.  There is a vague density overlying the left hemithorax, raising the question of contusion.  IMPRESSION:  1.  Suspect multiple left rib fractures and possible contusion. 2.  Mediastinal width suggests possible mediastinal hematoma or may be accentuated by patient position and rotation. Aortic injury should be considered.  Further evaluation with CT of the chest with contrast is recommended.  Critical test results telephoned to Dr. Deretha Emory at the time of interpretation on date 09/24/2011 at time 8:43 p.m.  Original Report Authenticated By: Patterson Hammersmith, M.D.     Review of Systems  Constitutional: Negative for fever, chills and weight loss.  HENT: Negative for hearing loss, ear pain and sore throat.        Edentulous   Eyes: Negative for blurred vision, double vision and redness.  Respiratory: Positive for shortness of breath. Negative for wheezing.   Cardiovascular: Negative for palpitations and orthopnea.  Gastrointestinal: Negative for nausea, vomiting, abdominal pain and constipation.  Genitourinary: Negative for dysuria and urgency.  Musculoskeletal: Positive for back pain (chronic and acute).  Skin:       psoriasis   Neurological: Negative for dizziness, sensory change, focal weakness, loss of consciousness and headaches.  Psychiatric/Behavioral: Positive for substance abuse.    Blood pressure 137/92, pulse 115, temperature 98.3 F (36.8 C), temperature source Oral, resp. rate 28, height 5\' 10"  (1.778 m), weight 190 lb (86.183 kg), SpO2 97.00%. Physical Exam  Vitals reviewed. Constitutional: He is oriented to person, place, and time. He appears well-developed. He is cooperative. Nasal cannula in place.       Appears uncomfortable  HENT:  Head: Head is without raccoon's eyes, without Battle's sign, without abrasion and without contusion.  Right Ear: Hearing, tympanic membrane, external ear and ear canal normal.  Left Ear: Hearing, tympanic membrane, external ear and ear canal normal.  Mouth/Throat: Mucous membranes are normal.       edentulous  Eyes: Conjunctivae and EOM are normal. Pupils are equal, round, and reactive to light. No scleral icterus.  Neck: Normal range of motion. Neck supple. No JVD present. No tracheal deviation present.  Cardiovascular: Normal heart sounds and intact distal pulses.  Tachycardia present.   Respiratory: No stridor. He has no decreased breath sounds.       RR mid 20s; 99% on 2L; intermittently taking short rapid breaths  GI: Soft. Bowel  sounds are normal. He exhibits no distension. There is no  tenderness.       Well healed umbilical incision  Musculoskeletal: Normal range of motion. He exhibits no edema and no tenderness.       Thoracic back: He exhibits tenderness and bony tenderness.       MAE-W. Able to plantar/dorsi flex  Neurological: He is alert and oriented to person, place, and time. No cranial nerve deficit.  Skin: Skin is warm and dry.       Contusion b/l knee but nontender; scattered skin lesions c/w psoriasis   Psychiatric: His behavior is normal. Thought content normal.     Assessment/Plan Patient Active Problem List  Diagnoses  . Fall from tree  . Multiple fractures of ribs of left side  . Right pulmonary contusion  . Hypertension  . Chronic back pain  . Wedge compression fracture of T8 vertebra  . Wedge compression fracture of T10 vertebra  . Sternal fracture  . Fracture of transverse process of thoracic vertebra  . Heavy alcohol use   - Tiny left pneumothorax  Admit ICU for monitoring. T spine precautions. Spoke with Dr August Saucer- will see in am. Needs aggressive pain control and pulm toilet. No need for chest tube at this time. Will place on CIWA protocol given pt's heavy alcohol use. Will place SCDs and start VTE prophylaxis. Will place on telemetry and pulse ox. Will monitor BP. Will repeat CXR in am to monitor ptx.  Mary Sella. Andrey Campanile, MD, FACS General, Bariatric, & Minimally Invasive Surgery [facility], [place]   [facility] M 09/25/2011, 12:22 AM

## 2011-09-25 NOTE — Progress Notes (Signed)
Physical Therapy Cancellation Note: order received, chart reviewed, pt currently awaiting brace for mobility. Will attempt later as time allows. Thanks Delaney Meigs, PT [phone]

## 2011-09-25 NOTE — Progress Notes (Signed)
Patient ID: Anthony Hoover, male   DOB: Jan 06, 1966, 46 y.o.   MRN: [id number]   LOS: 1 day   Subjective: C/o pain. Hasn't had much pain medicine as he fell asleep after Ativan.  Objective: Vital signs in last 24 hours: Temp:  [97.6 F (36.4 C)-99.9 F (37.7 C)] 99.8 F (37.7 C) (03/26 0600) Pulse Rate:  [97-122] 97  (03/26 0700) Resp:  [22-38] 22  (03/26 0200) BP: (115-164)/(60-103) 118/76 mmHg (03/26 0700) SpO2:  [91 %-99 %] 97 % (03/26 0700) FiO2 (%):  [2 %-3 %] 2 % (03/26 0600) Weight:  [82.6 kg (182 lb 1.6 oz)-86.183 kg (190 lb)] 82.6 kg (182 lb 1.6 oz) (03/26 0230) Last BM Date: 09/24/11   IS:   Lab Results:  CBC  Basename 09/25/11 0413 09/24/11 2014  WBC 12.5* 11.4*  HGB 9.4* 11.2*  HCT 28.6* 33.6*  PLT 152 278   BMET  Basename 09/25/11 0413 09/24/11 2014  NA 136 137  K 4.3 4.5  CL 103 100  CO2 20 17*  GLUCOSE 127* 115*  BUN 8 9  CREATININE 0.69 0.80  CALCIUM 8.5 9.5    General appearance: alert and tremulous Resp: clear to auscultation bilaterally Cardio: Tachycardic GI: normal findings: bowel sounds normal and soft, non-tender Pulses: 2+ and symmetric  Assessment/Plan: Fall Multiple left rib fxs -- Pain control and pulmonary toilet Right pulmonary contusions -- Mild T8,10 compression fxs -- Mobilize in brace per Dr. August Saucer T8-12 TVP fxs Sternal fx ABL anemia -- Moderate. Will follow. Chronic back pain -- Treated by Dr. Cecelia Byars in [place]. Will contact to see if he will take over pain management once he's discharged. S/p CVA EtOH use -- CIWA. Add beer. Tobacco use HTN -- Home meds FEN -- Diet, PT/OT, beer. Will give PCA + home dose methadone. VTE -- Lovenox Dispo -- Transfer to SDU   Freeman Caldron, PA-C Pager: [phone] General Trauma PA Pager: [phone]   09/25/2011

## 2011-09-25 NOTE — Progress Notes (Signed)
UR of chart complete.  

## 2011-09-25 NOTE — Consult Note (Signed)
Reason for Consult:back pain Referring Physician: Dr Gaynelle Adu  Anthony Hoover is an 46 y.o. male.  HPI: Anthony Hoover is a 46 year old patient who is up in a tree cutting limbs when he fell on the day of admission. He describes severe back pain. He denies any numbness or tingling in his legs. He denies any other extremity complaints other than severe back pain and difficulty breathing. He was evaluated in the emergency room and admitted to the trauma service. As noted have a very small pneumothorax which was be observed as well as multiple transverse process fractures and compression fractures in his spine. He denies any extremity complaints. The patient does have a history of pre-existing back pain prior to this fall..  Past Medical History  Diagnosis Date  . Hypertension   . Back pain     Past Surgical History  Procedure Date  . Umbilical hernia repair     History reviewed. No pertinent family history.  Social History:  reports that he has been smoking Cigarettes.  He has been smoking about .5 packs per day. He does not have any smokeless tobacco history on file. He reports that he drinks about 9 - 12 ounces of alcohol per week. He reports that he does not use illicit drugs.  Allergies: No Known Allergies  Medications: I have reviewed the patient's current medications.  Results for orders placed during the hospital encounter of 09/24/11 (from the past 48 hour(s))  CBC     Status: Abnormal   Collection Time   09/24/11  8:14 PM      Component Value Range Comment   WBC 11.4 (*) 4.0 - 10.5 (K/uL)    RBC 3.30 (*) 4.22 - 5.81 (MIL/uL)    Hemoglobin 11.2 (*) 13.0 - 17.0 (g/dL)    HCT 40.9 (*) 81.1 - 52.0 (%)    MCV 101.8 (*) 78.0 - 100.0 (fL)    MCH 33.9  26.0 - 34.0 (pg)    MCHC 33.3  30.0 - 36.0 (g/dL)    RDW 91.4  78.2 - 95.6 (%)    Platelets 278  150 - 400 (K/uL)   BASIC METABOLIC PANEL     Status: Abnormal   Collection Time   09/24/11  8:14 PM      Component Value Range  Comment   Sodium 137  135 - 145 (mEq/L)    Potassium 4.5  3.5 - 5.1 (mEq/L)    Chloride 100  96 - 112 (mEq/L)    CO2 17 (*) 19 - 32 (mEq/L)    Glucose, Bld 115 (*) 70 - 99 (mg/dL)    BUN 9  6 - 23 (mg/dL)    Creatinine, Ser 2.13  0.50 - 1.35 (mg/dL)    Calcium 9.5  8.4 - 10.5 (mg/dL)    GFR calc non Af Amer >90  >90 (mL/min)    GFR calc Af Amer >90  >90 (mL/min)   URINALYSIS, ROUTINE W REFLEX MICROSCOPIC     Status: Abnormal   Collection Time   09/24/11  9:46 PM      Component Value Range Comment   Color, Urine STRAW (*) YELLOW     APPearance CLEAR  CLEAR     Specific Gravity, Urine <1.005 (*) 1.005 - 1.030     pH 5.0  5.0 - 8.0     Glucose, UA NEGATIVE  NEGATIVE (mg/dL)    Hgb urine dipstick SMALL (*) NEGATIVE     Bilirubin Urine NEGATIVE  NEGATIVE  Ketones, ur NEGATIVE  NEGATIVE (mg/dL)    Protein, ur NEGATIVE  NEGATIVE (mg/dL)    Urobilinogen, UA 0.2  0.0 - 1.0 (mg/dL)    Nitrite NEGATIVE  NEGATIVE     Leukocytes, UA NEGATIVE  NEGATIVE    URINE MICROSCOPIC-ADD ON     Status: Abnormal   Collection Time   09/24/11  9:46 PM      Component Value Range Comment   Squamous Epithelial / LPF RARE  RARE     WBC, UA 0-2  <3 (WBC/hpf)    RBC / HPF 0-2  <3 (RBC/hpf)    Bacteria, UA RARE  RARE     Casts HYALINE CASTS (*) NEGATIVE    MRSA PCR SCREENING     Status: Normal   Collection Time   09/25/11  2:29 AM      Component Value Range Comment   MRSA by PCR NEGATIVE  NEGATIVE    GLUCOSE, CAPILLARY     Status: Abnormal   Collection Time   09/25/11  2:34 AM      Component Value Range Comment   Glucose-Capillary 143 (*) 70 - 99 (mg/dL)   BASIC METABOLIC PANEL     Status: Abnormal   Collection Time   09/25/11  4:13 AM      Component Value Range Comment   Sodium 136  135 - 145 (mEq/L)    Potassium 4.3  3.5 - 5.1 (mEq/L)    Chloride 103  96 - 112 (mEq/L)    CO2 20  19 - 32 (mEq/L)    Glucose, Bld 127 (*) 70 - 99 (mg/dL)    BUN 8  6 - 23 (mg/dL)    Creatinine, Ser 4.09  0.50 - 1.35  (mg/dL)    Calcium 8.5  8.4 - 10.5 (mg/dL)    GFR calc non Af Amer >90  >90 (mL/min)    GFR calc Af Amer >90  >90 (mL/min)   CBC     Status: Abnormal   Collection Time   09/25/11  4:13 AM      Component Value Range Comment   WBC 12.5 (*) 4.0 - 10.5 (K/uL)    RBC 2.82 (*) 4.22 - 5.81 (MIL/uL)    Hemoglobin 9.4 (*) 13.0 - 17.0 (g/dL)    HCT 81.1 (*) 91.4 - 52.0 (%)    MCV 101.4 (*) 78.0 - 100.0 (fL)    MCH 33.3  26.0 - 34.0 (pg)    MCHC 32.9  30.0 - 36.0 (g/dL)    RDW 78.2  95.6 - 21.3 (%)    Platelets 152  150 - 400 (K/uL)     Ct Head Wo Contrast  09/24/2011  *RADIOLOGY REPORT*  Clinical Data:  46 year old male - fall 20 feet out of tree.  Pain.  CT HEAD WITHOUT CONTRAST CT CERVICAL SPINE WITHOUT CONTRAST  Technique:  Multidetector CT imaging of the head and cervical spine was performed following the standard protocol without intravenous contrast.  Multiplanar CT image reconstructions of the cervical spine were also generated.  Comparison:  03/06/2008 head CT  CT HEAD  Findings: No acute intracranial abnormalities are identified, including mass lesion or mass effect, hydrocephalus, extra-axial fluid collection, midline shift, hemorrhage, or acute infarction.  The visualized bony calvarium is unremarkable.  IMPRESSION: No evidence of acute abnormality.  CT CERVICAL SPINE  Findings: Normal alignment is noted. There is no evidence of fracture, subluxation or prevertebral soft tissue swelling. The disc spaces are maintained. No focal bony lesions are present.  The visualized soft tissue structures are unremarkable.  IMPRESSION: No static evidence of acute injury to the cervical spine.  Original Report Authenticated By: Rosendo Gros, M.D.   Ct Chest W Contrast  09/24/2011  *RADIOLOGY REPORT*  Clinical Data:  46 year old male with chest, abdominal and pelvic pain following a 20 feet fall.  CT CHEST, ABDOMEN AND PELVIS WITH CONTRAST  Technique:  Multidetector CT imaging of the chest, abdomen and pelvis  was performed following the standard protocol during bolus administration of intravenous contrast.  Contrast:  100 ml intravenous Omnipaque-300.  Comparison:  04/21/2010 abdomen - pelvis CT and 09/24/2011 and prior chest radiographs.  CT CHEST  Findings:  Upper limits normal heart size identified. There is no evidence of mediastinal hematoma or pericardial effusion.  Fractures of the left 3rd-8th and 12th ribs are noted with tiny adjacent hemothorax and minuscule medial-anterior-basilar left pneumothorax. Minimal associated subcutaneous emphysema is noted. Bibasilar atelectasis is identified. Mild patchy airspace opacities within the right upper and lower lobes may represent contusions.  50% compression fractures of T8 and T10 are identified without bony retropulsion. Fractures of the left transverse processes of T8-T12 are noted. A nondisplaced sternal fracture is identified.  There is no evidence of enlarged lymph nodes, left pleural effusion or pericardial effusion. Moderate gynecomastia bilaterally is identified.  IMPRESSION: Fractures of the left 3rd through 8th and 12th ribs with tiny left pneumothorax and tiny hemothorax. Associated basilar atelectasis.  50% T8 and T10 compression fractures without bony retropulsion. Associated left transverse fractures of T8-T12.  Nondisplaced sternal fracture.  Mild right lung airspace opacities - question contusions.  CT ABDOMEN AND PELVIS  Findings:  The liver, spleen, pancreas, kidneys, adrenal glands and gallbladder are unremarkable.  The bowel, bladder and appendix are unremarkable. No free fluid, enlarged lymph nodes, biliary dilation or abdominal aortic aneurysm identified. No acute bony abnormalities within the abdomen or pelvis are noted.  IMPRESSION: No evidence of acute abnormality within the abdomen or pelvis.  Original Report Authenticated By: Rosendo Gros, M.D.   Ct Cervical Spine Wo Contrast  09/24/2011  *RADIOLOGY REPORT*  Clinical Data:  46 year old  male - fall 20 feet out of tree.  Pain.  CT HEAD WITHOUT CONTRAST CT CERVICAL SPINE WITHOUT CONTRAST  Technique:  Multidetector CT imaging of the head and cervical spine was performed following the standard protocol without intravenous contrast.  Multiplanar CT image reconstructions of the cervical spine were also generated.  Comparison:  03/06/2008 head CT  CT HEAD  Findings: No acute intracranial abnormalities are identified, including mass lesion or mass effect, hydrocephalus, extra-axial fluid collection, midline shift, hemorrhage, or acute infarction.  The visualized bony calvarium is unremarkable.  IMPRESSION: No evidence of acute abnormality.  CT CERVICAL SPINE  Findings: Normal alignment is noted. There is no evidence of fracture, subluxation or prevertebral soft tissue swelling. The disc spaces are maintained. No focal bony lesions are present. The visualized soft tissue structures are unremarkable.  IMPRESSION: No static evidence of acute injury to the cervical spine.  Original Report Authenticated By: Rosendo Gros, M.D.   Ct Abdomen Pelvis W Contrast  09/24/2011  *RADIOLOGY REPORT*  Clinical Data:  46 year old male with chest, abdominal and pelvic pain following a 20 feet fall.  CT CHEST, ABDOMEN AND PELVIS WITH CONTRAST  Technique:  Multidetector CT imaging of the chest, abdomen and pelvis was performed following the standard protocol during bolus administration of intravenous contrast.  Contrast:  100 ml intravenous Omnipaque-300.  Comparison:  04/21/2010 abdomen -  pelvis CT and 09/24/2011 and prior chest radiographs.  CT CHEST  Findings:  Upper limits normal heart size identified. There is no evidence of mediastinal hematoma or pericardial effusion.  Fractures of the left 3rd-8th and 12th ribs are noted with tiny adjacent hemothorax and minuscule medial-anterior-basilar left pneumothorax. Minimal associated subcutaneous emphysema is noted. Bibasilar atelectasis is identified. Mild patchy airspace  opacities within the right upper and lower lobes may represent contusions.  50% compression fractures of T8 and T10 are identified without bony retropulsion. Fractures of the left transverse processes of T8-T12 are noted. A nondisplaced sternal fracture is identified.  There is no evidence of enlarged lymph nodes, left pleural effusion or pericardial effusion. Moderate gynecomastia bilaterally is identified.  IMPRESSION: Fractures of the left 3rd through 8th and 12th ribs with tiny left pneumothorax and tiny hemothorax. Associated basilar atelectasis.  50% T8 and T10 compression fractures without bony retropulsion. Associated left transverse fractures of T8-T12.  Nondisplaced sternal fracture.  Mild right lung airspace opacities - question contusions.  CT ABDOMEN AND PELVIS  Findings:  The liver, spleen, pancreas, kidneys, adrenal glands and gallbladder are unremarkable.  The bowel, bladder and appendix are unremarkable. No free fluid, enlarged lymph nodes, biliary dilation or abdominal aortic aneurysm identified. No acute bony abnormalities within the abdomen or pelvis are noted.  IMPRESSION: No evidence of acute abnormality within the abdomen or pelvis.  Original Report Authenticated By: Rosendo Gros, M.D.   Dg Chest Portable 1 View  09/25/2011  *RADIOLOGY REPORT*  Clinical Data: Status post fall; reevaluate rib fractures and lungs after transport.  PORTABLE CHEST - 1 VIEW  Comparison: Chest radiograph and CT of the chest performed 09/24/2011  Findings: The lungs are hypoexpanded.  The known tiny left pneumothorax and tiny hemothorax are not well characterized on radiograph.  Minimal bilateral atelectasis is noted.  Nearly nondisplaced left-sided rib fractures are better characterized on prior CT.  No new osseous abnormalities are identified.  The cardiomediastinal silhouette is borderline normal in size.  IMPRESSION: Lungs hypoexpanded; known tiny left pneumothorax and tiny hemothorax are not well  characterized on radiograph.  Nearly nondisplaced left-sided rib fractures are also better characterized on prior CT.  Original Report Authenticated By: Tonia Ghent, M.D.   Dg Chest Portable 1 View  09/24/2011  *RADIOLOGY REPORT*  Clinical Data: The patient fell 20 feet from a tree tonight. Respiratory difficulty.  History of hypertension.  PORTABLE CHEST - 1 VIEW  Comparison: 03/03/2008  Findings: The patient is slightly rotated towards the right. However, the mediastinal width is prominent and irregular.  There is increased density in the right paratracheal region.  There is deformity of multiple left ribs, consistent with fractures.  No definite evidence for pneumothorax.  There is a vague density overlying the left hemithorax, raising the question of contusion.  IMPRESSION:  1.  Suspect multiple left rib fractures and possible contusion. 2.  Mediastinal width suggests possible mediastinal hematoma or may be accentuated by patient position and rotation. Aortic injury should be considered.  Further evaluation with CT of the chest with contrast is recommended.  Critical test results telephoned to Dr. Deretha Emory at the time of interpretation on date 09/24/2011 at time 8:43 p.m.  Original Report Authenticated By: Patterson Hammersmith, M.D.    Review of Systems  Constitutional: Negative.   HENT: Negative.   Eyes: Negative.   Respiratory: Positive for shortness of breath.   Cardiovascular: Positive for chest pain.  Gastrointestinal: Negative.   Genitourinary: Negative.   Musculoskeletal:  Positive for joint pain.  Skin: Negative.   Neurological: Negative.   Endo/Heme/Allergies: Negative.   Psychiatric/Behavioral: Negative.    Blood pressure 143/94, pulse 104, temperature 99.7 F (37.6 C), temperature source Oral, resp. rate 19, height 5\' 9"  (1.753 m), weight 82.6 kg (182 lb 1.6 oz), SpO2 98.00%. Physical Exam  Constitutional: He appears well-developed.  HENT:  Head: Normocephalic.  Eyes: Pupils  are equal, round, and reactive to light.  Neck: Normal range of motion. Neck supple.  Cardiovascular: Normal rate.   Respiratory: He exhibits tenderness.  GI: Soft.  Neurological: He is alert.  Skin: Skin is warm.   patient has good range of motion of his neck with no paresthesias in the arms. Radial pulse 2+ out of 4 bilateral. Patient has 5 out of 5 grip EPL FPL interosseous wrist flexion wrist extension biceps triceps and deltoid strength. There is no coarse grinding or crepitus with active or passive range of motion of the elbow shoulder or wrist on either side. The patient does have sternal tenderness but no clavicular tenderness. There is no groin pain with internal/external rotation of the legs. Multiple small bruises are present on the legs but the compartments are soft bilaterally. DP PT pulse palpable intact bilateral feet. He has 5 out of 5 dorsiflexion plantarflexion quadrant hip flexion strength bilaterally. No paresthesias L1 and S1 bilaterally. Trace effusion in the left knee no effusion the right knee. Collaterals and cruciates are stable bilaterally.  Assessment/Plan: Impression is multi-trauma from fall. The patient has 50% compression fractures of T8 and T10. There also transverse process fractures from T8-T12. There is no neurologic compromise at this time. The patient has no other extremity injuries by exam. Plan at this time is for mobilization with thoraco lumbar brace. This is going to be very painful injury for the patient to recover from because of the multiple levels involved. He will require a brace for approximately 6 weeks. He'll need repeat lateral standing radiographs in 3 weeks to assess further for collapse. Transverse process fractures will also take several months to become less symptomatic.  Anthony Hoover SCOTT 09/25/2011, 11:31 AM

## 2011-09-25 NOTE — Progress Notes (Signed)
Pt seen and examined Multiple compression fxs Neuro intact No other extremity injury Full consult to follow Will need brace to mobilize

## 2011-09-25 NOTE — Consult Note (Signed)
Reason for Consul  fracturer thoracic spine Referring Physician: dr Vernia Buff is an 46 y.o. male.  HPI: 46 Y/O  Male who fell off a tree while cutting branches. Landed in his back and soon aftre developed lbp. No weakness, no sensory changes. admitted to the icu. Ct chest and abdomen were taken. At present his major c/o is pain with breathing. aws oob yesterday according to the nurses .,able to walk  Past Medical History  Diagnosis Date  . Hypertension   . Back pain     Past Surgical History  Procedure Date  . Umbilical hernia repair     History reviewed. No pertinent family history.  Social History:  reports that he has been smoking Cigarettes.  He has been smoking about .5 packs per day. He does not have any smokeless tobacco history on file. He reports that he drinks about 9 - 12 ounces of alcohol per week. He reports that he does not use illicit drugs.  Allergies: No Known Allergies  Medications:see trauma note  Results for orders placed during the hospital encounter of 09/24/11 (from the past 48 hour(s))  CBC     Status: Abnormal   Collection Time   09/24/11  8:14 PM      Component Value Range Comment   WBC 11.4 (*) 4.0 - 10.5 (K/uL)    RBC 3.30 (*) 4.22 - 5.81 (MIL/uL)    Hemoglobin 11.2 (*) 13.0 - 17.0 (g/dL)    HCT 40.9 (*) 81.1 - 52.0 (%)    MCV 101.8 (*) 78.0 - 100.0 (fL)    MCH 33.9  26.0 - 34.0 (pg)    MCHC 33.3  30.0 - 36.0 (g/dL)    RDW 91.4  78.2 - 95.6 (%)    Platelets 278  150 - 400 (K/uL)   BASIC METABOLIC PANEL     Status: Abnormal   Collection Time   09/24/11  8:14 PM      Component Value Range Comment   Sodium 137  135 - 145 (mEq/L)    Potassium 4.5  3.5 - 5.1 (mEq/L)    Chloride 100  96 - 112 (mEq/L)    CO2 17 (*) 19 - 32 (mEq/L)    Glucose, Bld 115 (*) 70 - 99 (mg/dL)    BUN 9  6 - 23 (mg/dL)    Creatinine, Ser 2.13  0.50 - 1.35 (mg/dL)    Calcium 9.5  8.4 - 10.5 (mg/dL)    GFR calc non Af Amer >90  >90 (mL/min)    GFR calc Af Amer  >90  >90 (mL/min)   URINALYSIS, ROUTINE W REFLEX MICROSCOPIC     Status: Abnormal   Collection Time   09/24/11  9:46 PM      Component Value Range Comment   Color, Urine STRAW (*) YELLOW     APPearance CLEAR  CLEAR     Specific Gravity, Urine <1.005 (*) 1.005 - 1.030     pH 5.0  5.0 - 8.0     Glucose, UA NEGATIVE  NEGATIVE (mg/dL)    Hgb urine dipstick SMALL (*) NEGATIVE     Bilirubin Urine NEGATIVE  NEGATIVE     Ketones, ur NEGATIVE  NEGATIVE (mg/dL)    Protein, ur NEGATIVE  NEGATIVE (mg/dL)    Urobilinogen, UA 0.2  0.0 - 1.0 (mg/dL)    Nitrite NEGATIVE  NEGATIVE     Leukocytes, UA NEGATIVE  NEGATIVE    URINE MICROSCOPIC-ADD ON  Status: Abnormal   Collection Time   09/24/11  9:46 PM      Component Value Range Comment   Squamous Epithelial / LPF RARE  RARE     WBC, UA 0-2  <3 (WBC/hpf)    RBC / HPF 0-2  <3 (RBC/hpf)    Bacteria, UA RARE  RARE     Casts HYALINE CASTS (*) NEGATIVE    MRSA PCR SCREENING     Status: Normal   Collection Time   09/25/11  2:29 AM      Component Value Range Comment   MRSA by PCR NEGATIVE  NEGATIVE    GLUCOSE, CAPILLARY     Status: Abnormal   Collection Time   09/25/11  2:34 AM      Component Value Range Comment   Glucose-Capillary 143 (*) 70 - 99 (mg/dL)   BASIC METABOLIC PANEL     Status: Abnormal   Collection Time   09/25/11  4:13 AM      Component Value Range Comment   Sodium 136  135 - 145 (mEq/L)    Potassium 4.3  3.5 - 5.1 (mEq/L)    Chloride 103  96 - 112 (mEq/L)    CO2 20  19 - 32 (mEq/L)    Glucose, Bld 127 (*) 70 - 99 (mg/dL)    BUN 8  6 - 23 (mg/dL)    Creatinine, Ser 1.61  0.50 - 1.35 (mg/dL)    Calcium 8.5  8.4 - 10.5 (mg/dL)    GFR calc non Af Amer >90  >90 (mL/min)    GFR calc Af Amer >90  >90 (mL/min)   CBC     Status: Abnormal   Collection Time   09/25/11  4:13 AM      Component Value Range Comment   WBC 12.5 (*) 4.0 - 10.5 (K/uL)    RBC 2.82 (*) 4.22 - 5.81 (MIL/uL)    Hemoglobin 9.4 (*) 13.0 - 17.0 (g/dL)    HCT 09.6  (*) 04.5 - 52.0 (%)    MCV 101.4 (*) 78.0 - 100.0 (fL)    MCH 33.3  26.0 - 34.0 (pg)    MCHC 32.9  30.0 - 36.0 (g/dL)    RDW 40.9  81.1 - 91.4 (%)    Platelets 152  150 - 400 (K/uL)     Ct Head Wo Contrast  09/24/2011  *RADIOLOGY REPORT*  Clinical Data:  46 year old male - fall 20 feet out of tree.  Pain.  CT HEAD WITHOUT CONTRAST CT CERVICAL SPINE WITHOUT CONTRAST  Technique:  Multidetector CT imaging of the head and cervical spine was performed following the standard protocol without intravenous contrast.  Multiplanar CT image reconstructions of the cervical spine were also generated.  Comparison:  03/06/2008 head CT  CT HEAD  Findings: No acute intracranial abnormalities are identified, including mass lesion or mass effect, hydrocephalus, extra-axial fluid collection, midline shift, hemorrhage, or acute infarction.  The visualized bony calvarium is unremarkable.  IMPRESSION: No evidence of acute abnormality.  CT CERVICAL SPINE  Findings: Normal alignment is noted. There is no evidence of fracture, subluxation or prevertebral soft tissue swelling. The disc spaces are maintained. No focal bony lesions are present. The visualized soft tissue structures are unremarkable.  IMPRESSION: No static evidence of acute injury to the cervical spine.  Original Report Authenticated By: Rosendo Gros, M.D.   Ct Chest W Contrast  09/25/2011  **ADDENDUM** CREATED: 09/25/2011 16:05:19  Review of this study was requested by Dr. August Saucer as  well as clarification on direction of additional imaging.  Reviewing the study, T8 and T10 fractures appear to be burst fractures with disruption of the posterior vertebral body.  Additionally, at T8 there is extension of the fracture into the posterior elements, with axially or oriented fracture plane extending through the posterior elements of T8 bilaterally, involving both pars interarticularis.  Nondisplaced spinous process fractures of T7 and T9 are also noted.  There is a nondisplaced  T10 left laminar fracture as well.  Regarding further imaging, if better resolution is needed for planning, dedicated reconstructed thoracic spine images can be created from the CT in order to spare further radiation dose at clinician's request.  MRI would be more useful to assess for the amount of central canal compromise associated with thoracic spine fractures.  Annotated images marking fractures were saved as a presentation state.  **END ADDENDUM** SIGNED BY: Andreas Newport, M.D.    09/24/2011  *RADIOLOGY REPORT*  Clinical Data:  46 year old male with chest, abdominal and pelvic pain following a 20 feet fall.  CT CHEST, ABDOMEN AND PELVIS WITH CONTRAST  Technique:  Multidetector CT imaging of the chest, abdomen and pelvis was performed following the standard protocol during bolus administration of intravenous contrast.  Contrast:  100 ml intravenous Omnipaque-300.  Comparison:  04/21/2010 abdomen - pelvis CT and 09/24/2011 and prior chest radiographs.  CT CHEST  Findings:  Upper limits normal heart size identified. There is no evidence of mediastinal hematoma or pericardial effusion.  Fractures of the left 3rd-8th and 12th ribs are noted with tiny adjacent hemothorax and minuscule medial-anterior-basilar left pneumothorax. Minimal associated subcutaneous emphysema is noted. Bibasilar atelectasis is identified. Mild patchy airspace opacities within the right upper and lower lobes may represent contusions.  50% compression fractures of T8 and T10 are identified without bony retropulsion. Fractures of the left transverse processes of T8-T12 are noted. A nondisplaced sternal fracture is identified.  There is no evidence of enlarged lymph nodes, left pleural effusion or pericardial effusion. Moderate gynecomastia bilaterally is identified.  IMPRESSION: Fractures of the left 3rd through 8th and 12th ribs with tiny left pneumothorax and tiny hemothorax. Associated basilar atelectasis.  50% T8 and T10 compression  fractures without bony retropulsion. Associated left transverse fractures of T8-T12.  Nondisplaced sternal fracture.  Mild right lung airspace opacities - question contusions.  CT ABDOMEN AND PELVIS  Findings:  The liver, spleen, pancreas, kidneys, adrenal glands and gallbladder are unremarkable.  The bowel, bladder and appendix are unremarkable. No free fluid, enlarged lymph nodes, biliary dilation or abdominal aortic aneurysm identified. No acute bony abnormalities within the abdomen or pelvis are noted.  IMPRESSION: No evidence of acute abnormality within the abdomen or pelvis.  Original Report Authenticated By: Andreas Newport, M.D.   Ct Cervical Spine Wo Contrast  09/24/2011  *RADIOLOGY REPORT*  Clinical Data:  46 year old male - fall 20 feet out of tree.  Pain.  CT HEAD WITHOUT CONTRAST CT CERVICAL SPINE WITHOUT CONTRAST  Technique:  Multidetector CT imaging of the head and cervical spine was performed following the standard protocol without intravenous contrast.  Multiplanar CT image reconstructions of the cervical spine were also generated.  Comparison:  03/06/2008 head CT  CT HEAD  Findings: No acute intracranial abnormalities are identified, including mass lesion or mass effect, hydrocephalus, extra-axial fluid collection, midline shift, hemorrhage, or acute infarction.  The visualized bony calvarium is unremarkable.  IMPRESSION: No evidence of acute abnormality.  CT CERVICAL SPINE  Findings: Normal alignment is noted. There is  no evidence of fracture, subluxation or prevertebral soft tissue swelling. The disc spaces are maintained. No focal bony lesions are present. The visualized soft tissue structures are unremarkable.  IMPRESSION: No static evidence of acute injury to the cervical spine.  Original Report Authenticated By: Rosendo Gros, M.D.   Ct Abdomen Pelvis W Contrast  09/25/2011  **ADDENDUM** CREATED: 09/25/2011 16:05:19  Review of this study was requested by Dr. August Saucer as well as clarification  on direction of additional imaging.  Reviewing the study, T8 and T10 fractures appear to be burst fractures with disruption of the posterior vertebral body.  Additionally, at T8 there is extension of the fracture into the posterior elements, with axially or oriented fracture plane extending through the posterior elements of T8 bilaterally, involving both pars interarticularis.  Nondisplaced spinous process fractures of T7 and T9 are also noted.  There is a nondisplaced T10 left laminar fracture as well.  Regarding further imaging, if better resolution is needed for planning, dedicated reconstructed thoracic spine images can be created from the CT in order to spare further radiation dose at clinician's request.  MRI would be more useful to assess for the amount of central canal compromise associated with thoracic spine fractures.  Annotated images marking fractures were saved as a presentation state.  **END ADDENDUM** SIGNED BY: Andreas Newport, M.D.    09/24/2011  *RADIOLOGY REPORT*  Clinical Data:  45 year old male with chest, abdominal and pelvic pain following a 20 feet fall.  CT CHEST, ABDOMEN AND PELVIS WITH CONTRAST  Technique:  Multidetector CT imaging of the chest, abdomen and pelvis was performed following the standard protocol during bolus administration of intravenous contrast.  Contrast:  100 ml intravenous Omnipaque-300.  Comparison:  04/21/2010 abdomen - pelvis CT and 09/24/2011 and prior chest radiographs.  CT CHEST  Findings:  Upper limits normal heart size identified. There is no evidence of mediastinal hematoma or pericardial effusion.  Fractures of the left 3rd-8th and 12th ribs are noted with tiny adjacent hemothorax and minuscule medial-anterior-basilar left pneumothorax. Minimal associated subcutaneous emphysema is noted. Bibasilar atelectasis is identified. Mild patchy airspace opacities within the right upper and lower lobes may represent contusions.  50% compression fractures of T8 and T10  are identified without bony retropulsion. Fractures of the left transverse processes of T8-T12 are noted. A nondisplaced sternal fracture is identified.  There is no evidence of enlarged lymph nodes, left pleural effusion or pericardial effusion. Moderate gynecomastia bilaterally is identified.  IMPRESSION: Fractures of the left 3rd through 8th and 12th ribs with tiny left pneumothorax and tiny hemothorax. Associated basilar atelectasis.  50% T8 and T10 compression fractures without bony retropulsion. Associated left transverse fractures of T8-T12.  Nondisplaced sternal fracture.  Mild right lung airspace opacities - question contusions.  CT ABDOMEN AND PELVIS  Findings:  The liver, spleen, pancreas, kidneys, adrenal glands and gallbladder are unremarkable.  The bowel, bladder and appendix are unremarkable. No free fluid, enlarged lymph nodes, biliary dilation or abdominal aortic aneurysm identified. No acute bony abnormalities within the abdomen or pelvis are noted.  IMPRESSION: No evidence of acute abnormality within the abdomen or pelvis.  Original Report Authenticated By: Andreas Newport, M.D.   Dg Chest Portable 1 View  09/25/2011  *RADIOLOGY REPORT*  Clinical Data: Status post fall; reevaluate rib fractures and lungs after transport.  PORTABLE CHEST - 1 VIEW  Comparison: Chest radiograph and CT of the chest performed 09/24/2011  Findings: The lungs are hypoexpanded.  The known tiny left pneumothorax and  tiny hemothorax are not well characterized on radiograph.  Minimal bilateral atelectasis is noted.  Nearly nondisplaced left-sided rib fractures are better characterized on prior CT.  No new osseous abnormalities are identified.  The cardiomediastinal silhouette is borderline normal in size.  IMPRESSION: Lungs hypoexpanded; known tiny left pneumothorax and tiny hemothorax are not well characterized on radiograph.  Nearly nondisplaced left-sided rib fractures are also better characterized on prior CT.   Original Report Authenticated By: Tonia Ghent, M.D.   Dg Chest Portable 1 View  09/24/2011  *RADIOLOGY REPORT*  Clinical Data: The patient fell 20 feet from a tree tonight. Respiratory difficulty.  History of hypertension.  PORTABLE CHEST - 1 VIEW  Comparison: 03/03/2008  Findings: The patient is slightly rotated towards the right. However, the mediastinal width is prominent and irregular.  There is increased density in the right paratracheal region.  There is deformity of multiple left ribs, consistent with fractures.  No definite evidence for pneumothorax.  There is a vague density overlying the left hemithorax, raising the question of contusion.  IMPRESSION:  1.  Suspect multiple left rib fractures and possible contusion. 2.  Mediastinal width suggests possible mediastinal hematoma or may be accentuated by patient position and rotation. Aortic injury should be considered.  Further evaluation with CT of the chest with contrast is recommended.  Critical test results telephoned to Dr. Deretha Emory at the time of interpretation on date 09/24/2011 at time 8:43 p.m.  Original Report Authenticated By: Patterson Hammersmith, M.D.    Review of Systems  HENT: Negative.   Eyes: Negative.   Respiratory:       C/o of pain with deep breathing  Cardiovascular: Negative.   Gastrointestinal: Negative.   Genitourinary: Negative.   Musculoskeletal: Positive for back pain.  Skin: Negative.   Neurological: Negative.   Endo/Heme/Allergies: Negative.   Psychiatric/Behavioral: Negative.    Blood pressure 127/75, pulse 113, temperature 99 F (37.2 C), temperature source Core (Comment), resp. rate 18, height 5\' 9"  (1.753 m), weight 82.6 kg (182 lb 1.6 oz), SpO2 94.00%. Physical Examtenderness in midthoracic area. strenght ,normal in both lower extremities. Sensory normal to pp, propioception. Dtr, nl ct head and cercical spine, no traumatic lesion.ct chest and abdomen, fracture of t8 and t10 with 50% od height with no  retropulsion in the canal.Fx of transverse process t8 thru t12. Posterior element are intact.  Assessment/Plan: Patient was given a thoracic brace. He can be with hob up to 30 degrees. Will follow.  Shaquna Geigle M 09/25/2011, 8:07 PM

## 2011-09-25 NOTE — Progress Notes (Signed)
CHAPLAIN NOTE:  Received Level 2 Trauma page. Spoke with ED staff and treating doctor. No family present or mentioned. Patient under treatment. No apparent spiritual needs at this time. Asked Bridge & Triage to page on call chaplain if family or need arises.

## 2011-09-25 NOTE — ED Notes (Signed)
Assumed care of pt.    Dr Andrey Campanile at bedside doing assessment.  Abrasion on bilateral knees.  Pain in back only.

## 2011-09-25 NOTE — Progress Notes (Signed)
Orthopedic Tech Progress Note Patient Details:  Anthony Hoover March 18, 1966 [id number]  Patient ID: Anthony Hoover, male   DOB: Dec 16, 1965, 46 y.o.   MRN: [id number] [facility] @ [address] Anthony Hoover 09/25/2011, 2:24 PM

## 2011-09-25 NOTE — ED Notes (Signed)
Spoke with flow manager about pts bed request not being put in.

## 2011-09-25 NOTE — Progress Notes (Signed)
Orthopedic Tech Progress Note Patient Details:  Anthony Hoover 1965-10-05 [id number]  Patient ID: [name], male   DOB: [birth year], 46 y.o.   MRN: [id number] Order completed by Storm Frisk, Dinita Migliaccio 09/25/2011, 3:36 PM

## 2011-09-26 ENCOUNTER — Inpatient Hospital Stay (HOSPITAL_COMMUNITY): Payer: Medicaid Other

## 2011-09-26 LAB — CBC
HCT: 28.8 % — ABNORMAL LOW (ref 39.0–52.0)
Hemoglobin: 9.6 g/dL — ABNORMAL LOW (ref 13.0–17.0)
MCH: 34 pg (ref 26.0–34.0)
MCHC: 33.3 g/dL (ref 30.0–36.0)
MCV: 102.1 fL — ABNORMAL HIGH (ref 78.0–100.0)
RDW: 12.7 % (ref 11.5–15.5)

## 2011-09-26 MED ORDER — METHOCARBAMOL 500 MG PO TABS
1000.0000 mg | ORAL_TABLET | Freq: Three times a day (TID) | ORAL | Status: DC
  Administered 2011-09-26 (×3): 1000 mg via ORAL
  Filled 2011-09-26 (×6): qty 2

## 2011-09-26 MED ORDER — HYDROMORPHONE 0.3 MG/ML IV SOLN
INTRAVENOUS | Status: AC
  Filled 2011-09-26: qty 25

## 2011-09-26 MED ORDER — HYDROMORPHONE 0.3 MG/ML IV SOLN
INTRAVENOUS | Status: AC
  Administered 2011-09-26: 16:00:00
  Filled 2011-09-26: qty 25

## 2011-09-26 NOTE — Progress Notes (Signed)
Trauma Service Note  Subjective: Patient complaining of severe back spasms and pain.  Not wanting to get OOB.  Dr. Jeral Fruit has cleared the patient for getting OOB with brace.  Objective: Vital signs in last 24 hours: Temp:  [97.9 F (36.6 C)-100.2 F (37.9 C)] 100.1 F (37.8 C) (03/27 0900) Pulse Rate:  [91-113] 101  (03/27 0900) Resp:  [17-24] 24  (03/27 0900) BP: (122-153)/(72-98) 142/88 mmHg (03/27 0900) SpO2:  [93 %-98 %] 94 % (03/27 0900) FiO2 (%):  [2 %] 2 % (03/26 1400) Last BM Date: 09/24/11  Intake/Output from previous day: 03/26 0701 - 03/27 0700 In: 1674.3 [P.O.:940; I.V.:534.3; IV Piggyback:200] Out: 1550 [Urine:1550] Intake/Output this shift:    General: Looks uncomfortable, but no acute distress  Lungs: Clear.  Oxygen sats 93% on supplementation  Abd: Distended, but active bowel sounds.  Nontender.Patient states that this is his normal  Extremities: No changes  Neuro: Intact   Lab Results: CBC   Basename 09/26/11 0445 09/25/11 0413  WBC 8.1 12.5*  HGB 9.6* 9.4*  HCT 28.8* 28.6*  PLT 140* 152   BMET  Basename 09/25/11 0413 09/24/11 2014  NA 136 137  K 4.3 4.5  CL 103 100  CO2 20 17*  GLUCOSE 127* 115*  BUN 8 9  CREATININE 0.69 0.80  CALCIUM 8.5 9.5   PT/INR No results found for this basename: LABPROT:2,INR:2 in the last 72 hours ABG No results found for this basename: PHART:2,PCO2:2,PO2:2,HCO3:2 in the last 72 hours  Studies/Results: Ct Head Wo Contrast  09/24/2011  *RADIOLOGY REPORT*  Clinical Data:  46 year old male - fall 20 feet out of tree.  Pain.  CT HEAD WITHOUT CONTRAST CT CERVICAL SPINE WITHOUT CONTRAST  Technique:  Multidetector CT imaging of the head and cervical spine was performed following the standard protocol without intravenous contrast.  Multiplanar CT image reconstructions of the cervical spine were also generated.  Comparison:  03/06/2008 head CT  CT HEAD  Findings: No acute intracranial abnormalities are identified,  including mass lesion or mass effect, hydrocephalus, extra-axial fluid collection, midline shift, hemorrhage, or acute infarction.  The visualized bony calvarium is unremarkable.  IMPRESSION: No evidence of acute abnormality.  CT CERVICAL SPINE  Findings: Normal alignment is noted. There is no evidence of fracture, subluxation or prevertebral soft tissue swelling. The disc spaces are maintained. No focal bony lesions are present. The visualized soft tissue structures are unremarkable.  IMPRESSION: No static evidence of acute injury to the cervical spine.  Original Report Authenticated By: Rosendo Gros, M.D.   Ct Chest W Contrast  09/25/2011  **ADDENDUM** CREATED: 09/25/2011 16:05:19  Review of this study was requested by Dr. August Saucer as well as clarification on direction of additional imaging.  Reviewing the study, T8 and T10 fractures appear to be burst fractures with disruption of the posterior vertebral body.  Additionally, at T8 there is extension of the fracture into the posterior elements, with axially or oriented fracture plane extending through the posterior elements of T8 bilaterally, involving both pars interarticularis.  Nondisplaced spinous process fractures of T7 and T9 are also noted.  There is a nondisplaced T10 left laminar fracture as well.  Regarding further imaging, if better resolution is needed for planning, dedicated reconstructed thoracic spine images can be created from the CT in order to spare further radiation dose at clinician's request.  MRI would be more useful to assess for the amount of central canal compromise associated with thoracic spine fractures.  Annotated images marking fractures were  saved as a presentation state.  **END ADDENDUM** SIGNED BY: Andreas Newport, M.D.    09/24/2011  *RADIOLOGY REPORT*  Clinical Data:  46 year old male with chest, abdominal and pelvic pain following a 20 feet fall.  CT CHEST, ABDOMEN AND PELVIS WITH CONTRAST  Technique:  Multidetector CT imaging of  the chest, abdomen and pelvis was performed following the standard protocol during bolus administration of intravenous contrast.  Contrast:  100 ml intravenous Omnipaque-300.  Comparison:  04/21/2010 abdomen - pelvis CT and 09/24/2011 and prior chest radiographs.  CT CHEST  Findings:  Upper limits normal heart size identified. There is no evidence of mediastinal hematoma or pericardial effusion.  Fractures of the left 3rd-8th and 12th ribs are noted with tiny adjacent hemothorax and minuscule medial-anterior-basilar left pneumothorax. Minimal associated subcutaneous emphysema is noted. Bibasilar atelectasis is identified. Mild patchy airspace opacities within the right upper and lower lobes may represent contusions.  50% compression fractures of T8 and T10 are identified without bony retropulsion. Fractures of the left transverse processes of T8-T12 are noted. A nondisplaced sternal fracture is identified.  There is no evidence of enlarged lymph nodes, left pleural effusion or pericardial effusion. Moderate gynecomastia bilaterally is identified.  IMPRESSION: Fractures of the left 3rd through 8th and 12th ribs with tiny left pneumothorax and tiny hemothorax. Associated basilar atelectasis.  50% T8 and T10 compression fractures without bony retropulsion. Associated left transverse fractures of T8-T12.  Nondisplaced sternal fracture.  Mild right lung airspace opacities - question contusions.  CT ABDOMEN AND PELVIS  Findings:  The liver, spleen, pancreas, kidneys, adrenal glands and gallbladder are unremarkable.  The bowel, bladder and appendix are unremarkable. No free fluid, enlarged lymph nodes, biliary dilation or abdominal aortic aneurysm identified. No acute bony abnormalities within the abdomen or pelvis are noted.  IMPRESSION: No evidence of acute abnormality within the abdomen or pelvis.  Original Report Authenticated By: Andreas Newport, M.D.   Ct Cervical Spine Wo Contrast  09/24/2011  *RADIOLOGY REPORT*   Clinical Data:  46 year old male - fall 20 feet out of tree.  Pain.  CT HEAD WITHOUT CONTRAST CT CERVICAL SPINE WITHOUT CONTRAST  Technique:  Multidetector CT imaging of the head and cervical spine was performed following the standard protocol without intravenous contrast.  Multiplanar CT image reconstructions of the cervical spine were also generated.  Comparison:  03/06/2008 head CT  CT HEAD  Findings: No acute intracranial abnormalities are identified, including mass lesion or mass effect, hydrocephalus, extra-axial fluid collection, midline shift, hemorrhage, or acute infarction.  The visualized bony calvarium is unremarkable.  IMPRESSION: No evidence of acute abnormality.  CT CERVICAL SPINE  Findings: Normal alignment is noted. There is no evidence of fracture, subluxation or prevertebral soft tissue swelling. The disc spaces are maintained. No focal bony lesions are present. The visualized soft tissue structures are unremarkable.  IMPRESSION: No static evidence of acute injury to the cervical spine.  Original Report Authenticated By: Rosendo Gros, M.D.   Ct Abdomen Pelvis W Contrast  09/25/2011  **ADDENDUM** CREATED: 09/25/2011 16:05:19  Review of this study was requested by Dr. August Saucer as well as clarification on direction of additional imaging.  Reviewing the study, T8 and T10 fractures appear to be burst fractures with disruption of the posterior vertebral body.  Additionally, at T8 there is extension of the fracture into the posterior elements, with axially or oriented fracture plane extending through the posterior elements of T8 bilaterally, involving both pars interarticularis.  Nondisplaced spinous process fractures of T7  and T9 are also noted.  There is a nondisplaced T10 left laminar fracture as well.  Regarding further imaging, if better resolution is needed for planning, dedicated reconstructed thoracic spine images can be created from the CT in order to spare further radiation dose at clinician's  request.  MRI would be more useful to assess for the amount of central canal compromise associated with thoracic spine fractures.  Annotated images marking fractures were saved as a presentation state.  **END ADDENDUM** SIGNED BY: Andreas Newport, M.D.    09/24/2011  *RADIOLOGY REPORT*  Clinical Data:  46 year old male with chest, abdominal and pelvic pain following a 20 feet fall.  CT CHEST, ABDOMEN AND PELVIS WITH CONTRAST  Technique:  Multidetector CT imaging of the chest, abdomen and pelvis was performed following the standard protocol during bolus administration of intravenous contrast.  Contrast:  100 ml intravenous Omnipaque-300.  Comparison:  04/21/2010 abdomen - pelvis CT and 09/24/2011 and prior chest radiographs.  CT CHEST  Findings:  Upper limits normal heart size identified. There is no evidence of mediastinal hematoma or pericardial effusion.  Fractures of the left 3rd-8th and 12th ribs are noted with tiny adjacent hemothorax and minuscule medial-anterior-basilar left pneumothorax. Minimal associated subcutaneous emphysema is noted. Bibasilar atelectasis is identified. Mild patchy airspace opacities within the right upper and lower lobes may represent contusions.  50% compression fractures of T8 and T10 are identified without bony retropulsion. Fractures of the left transverse processes of T8-T12 are noted. A nondisplaced sternal fracture is identified.  There is no evidence of enlarged lymph nodes, left pleural effusion or pericardial effusion. Moderate gynecomastia bilaterally is identified.  IMPRESSION: Fractures of the left 3rd through 8th and 12th ribs with tiny left pneumothorax and tiny hemothorax. Associated basilar atelectasis.  50% T8 and T10 compression fractures without bony retropulsion. Associated left transverse fractures of T8-T12.  Nondisplaced sternal fracture.  Mild right lung airspace opacities - question contusions.  CT ABDOMEN AND PELVIS  Findings:  The liver, spleen, pancreas,  kidneys, adrenal glands and gallbladder are unremarkable.  The bowel, bladder and appendix are unremarkable. No free fluid, enlarged lymph nodes, biliary dilation or abdominal aortic aneurysm identified. No acute bony abnormalities within the abdomen or pelvis are noted.  IMPRESSION: No evidence of acute abnormality within the abdomen or pelvis.  Original Report Authenticated By: Andreas Newport, M.D.   Dg Chest Port 1 View  09/26/2011  *RADIOLOGY REPORT*  Clinical Data: Follow up of pulmonary contusion.  PORTABLE CHEST - 1 VIEW  Comparison: 1 day prior  Findings: Patient rotated to the right.  Normal heart size. No pleural fluid.  Low lung volumes.  The left-sided pleural fluid and tiny pneumothorax are not readily apparent.  Patchy right upper lobe airspace disease is suspected. Left-sided rib fractures.  IMPRESSION:  1.  Suspect developing right upper lobe airspace disease. 2.  Lack of visualization of previous described left apical pneumothorax or pleural fluid.  Original Report Authenticated By: Consuello Bossier, M.D.   Dg Chest Portable 1 View  09/25/2011  *RADIOLOGY REPORT*  Clinical Data: Status post fall; reevaluate rib fractures and lungs after transport.  PORTABLE CHEST - 1 VIEW  Comparison: Chest radiograph and CT of the chest performed 09/24/2011  Findings: The lungs are hypoexpanded.  The known tiny left pneumothorax and tiny hemothorax are not well characterized on radiograph.  Minimal bilateral atelectasis is noted.  Nearly nondisplaced left-sided rib fractures are better characterized on prior CT.  No new osseous abnormalities are identified.  The cardiomediastinal silhouette is borderline normal in size.  IMPRESSION: Lungs hypoexpanded; known tiny left pneumothorax and tiny hemothorax are not well characterized on radiograph.  Nearly nondisplaced left-sided rib fractures are also better characterized on prior CT.  Original Report Authenticated By: Tonia Ghent, M.D.   Dg Chest Portable 1  View  09/24/2011  *RADIOLOGY REPORT*  Clinical Data: The patient fell 20 feet from a tree tonight. Respiratory difficulty.  History of hypertension.  PORTABLE CHEST - 1 VIEW  Comparison: 03/03/2008  Findings: The patient is slightly rotated towards the right. However, the mediastinal width is prominent and irregular.  There is increased density in the right paratracheal region.  There is deformity of multiple left ribs, consistent with fractures.  No definite evidence for pneumothorax.  There is a vague density overlying the left hemithorax, raising the question of contusion.  IMPRESSION:  1.  Suspect multiple left rib fractures and possible contusion. 2.  Mediastinal width suggests possible mediastinal hematoma or may be accentuated by patient position and rotation. Aortic injury should be considered.  Further evaluation with CT of the chest with contrast is recommended.  Critical test results telephoned to Dr. Deretha Emory at the time of interpretation on date 09/24/2011 at time 8:43 p.m.  Original Report Authenticated By: Patterson Hammersmith, M.D.    Anti-infectives: Anti-infectives    None      Assessment/Plan: s/p  d/c foley Advance diet Get OOB Ani-spasmodic  LOS: 2 days   Marta Lamas. Gae Bon, MD, FACS [phone] Trauma Surgeon 09/26/2011

## 2011-09-26 NOTE — Progress Notes (Signed)
C/O muscle spasm. No weakness. Plan to get oob with brace and help of py

## 2011-09-26 NOTE — Evaluation (Signed)
Occupational Therapy Evaluation Patient Details Name: Anthony Hoover MRN: [id number] DOB: 1966-03-27 Today's Date: 09/26/2011  Problem List:  Patient Active Problem List  Diagnoses  . Fall from tree  . Multiple fractures of ribs of left side  . Right pulmonary contusion  . Hypertension  . Chronic back pain  . Wedge compression fracture of T8 vertebra  . Wedge compression fracture of T10 vertebra  . Sternal fracture  . Fracture of transverse process of thoracic vertebra  . Heavy alcohol use  . Acute blood loss anemia  . History of CVA (cerebrovascular accident)  . Tobacco use    Past Medical History:  Past Medical History  Diagnosis Date  . Hypertension   . Back pain    Past Surgical History:  Past Surgical History  Procedure Date  . Umbilical hernia repair     OT Assessment/Plan/Recommendation OT Assessment Clinical Impression Statement: Pt admitted after fall from tree while cutting limbs. with T8-12 fx, Left rib fx, sternal fx. Pt presents with below problem list complicated by back muscle spasms with movement. Pt will benefit from skilled OT in the acute setting to maximize I with ADL and ADL mobility to Mod I- S upon d/c home (may require SNF stay to achieve this level prior to return home). **Spoke with Dr. Jeral Fruit who verbally approved pt for OOB activity with TLSO donned supine.** OT Recommendation/Assessment: Patient will need skilled OT in the acute care venue OT Problem List: Decreased strength;Decreased range of motion;Decreased activity tolerance;Impaired balance (sitting and/or standing);Decreased knowledge of precautions;Decreased knowledge of use of DME or AE;Cardiopulmonary status limiting activity;Pain OT Therapy Diagnosis : Generalized weakness;Acute pain OT Plan OT Frequency: Min 2X/week OT Treatment/Interventions: Self-care/ADL training;DME and/or AE instruction;Therapeutic activities;Cognitive remediation/compensation;Patient/family education;Balance  training OT Recommendation Follow Up Recommendations: Skilled nursing facility pending pt progress Equipment Recommended: Rolling walker with 5" wheels (TBD by OT) Individuals Consulted Consulted and Agree with Results and Recommendations: Patient OT Goals Acute Rehab OT Goals OT Goal Formulation: With patient Time For Goal Achievement: 2 weeks ADL Goals Pt Will Perform Grooming: with set-up;with supervision;Standing at sink ADL Goal: Grooming - Progress: Goal set today Pt Will Perform Upper Body Bathing: with set-up;with supervision;Supine, head of bed flat ADL Goal: Upper Body Bathing - Progress: Goal set today Pt Will Perform Lower Body Bathing: with min assist;Supine, rolling right and/or left ADL Goal: Lower Body Bathing - Progress: Goal set today Pt Will Perform Upper Body Dressing: with min assist;Supine, rolling right and/or left ADL Goal: Upper Body Dressing - Progress: Goal set today Pt Will Perform Lower Body Dressing: with min assist;Supine, rolling right and/or left (or Supervision using AE sit to stand from bed) ADL Goal: Lower Body Dressing - Progress: Goal set today Pt Will Transfer to Toilet: with supervision;Ambulation;with DME;3-in-1 ADL Goal: Toilet Transfer - Progress: Goal set today Pt Will Perform Toileting - Hygiene: with modified independence;with adaptive equipment;Sit to stand from 3-in-1/toilet;Sitting on 3-in-1 or toilet ADL Goal: Toileting - Hygiene - Progress: Goal set today Additional ADL Goal #1: Pt will I'ly instruct caregiver with donning/doffing TLSO rolling Lt/Rt in bed. ADL Goal: Additional Goal #1 - Progress: Goal set today  OT Evaluation Precautions/Restrictions  Precautions Precautions: Sternal;Fall;Back Precaution Booklet Issued: Yes (comment) Precaution Comments: Provided sternal precaution and back precaution sheet Required Braces or Orthoses: Yes Spinal Brace: Thoracolumbosacral orthotic;Applied in supine position Restrictions Weight  Bearing Restrictions: No Prior Functioning Home Living Lives With: Daughter;Other (Comment) Type of Home: House Home Layout: One level Home Access: Stairs to  enter Entrance Stairs-Number of Steps: 2 Bathroom Shower/Tub: Tub/shower unit;Walk-in shower;Curtain Bathroom Toilet: Standard Home Adaptive Equipment: Straight cane Prior Function Level of Independence: Independent with basic ADLs;Independent with homemaking with wheelchair;Independent with transfers;Independent with gait Driving: Yes Vocation: Unemployed Comments: Dgtr is in school and mother at home but mother cannot provide greater than supervision for mobility ADL ADL Eating/Feeding: Performed;Minimal assistance Eating/Feeding Details (indicate cue type and reason): Min A to bring cup with straw to mouth Where Assessed - Eating/Feeding: Chair Grooming: Simulated;Moderate assistance Grooming Details (indicate cue type and reason): set up and bringing objects to/using at face Where Assessed - Grooming: Sitting, chair Upper Body Bathing: Not assessed Lower Body Bathing: +1 Total assistance;Simulated Where Assessed - Lower Body Bathing: Rolling right and/or left Upper Body Dressing: Maximal assistance;Performed Upper Body Dressing Details (indicate cue type and reason): gown Where Assessed - Upper Body Dressing: Sitting, bed Lower Body Dressing: Simulated;+2 Total assistance Lower Body Dressing Details (indicate cue type and reason): +2 needed to steady pt while other person pulls up pants/underwear Where Assessed - Lower Body Dressing: Sit to stand from bed Toilet Transfer: Simulated;+2 Total assistance (pt=70% ) Toilet Transfer Details (indicate cue type and reason): simulated EOB to chair sit to stand Toileting - Clothing Manipulation: Simulated;+1 Total assistance Toileting - Hygiene: Simulated;+1 Total assistance Where Assessed - Toileting Hygiene: Standing Tub/Shower Transfer: Not assessed Equipment Used: Rolling  walker Ambulation Related to ADLs: +2total A(pt=75%) RW ambulation.  ADL Comments: Pt pre-medicated for pain, anxiety and spasms prior to therapy. Educated pt on pursed lip breathing and enouraged practicing this with transfers as pt with tendency to hold breath due to pain Vision/Perception  Vision - Assessment Additional Comments: NT Cognition Cognition Arousal/Alertness: Awake/alert Overall Cognitive Status: Appears within functional limits for tasks assessed Sensation/Coordination Sensation Light Touch: Not tested Coordination Gross Motor Movements are Fluid and Coordinated: Not tested Fine Motor Movements are Fluid and Coordinated: Not tested Extremity Assessment RUE Assessment RUE Assessment: Exceptions to [facility] RUE Strength RUE Overall Strength Comments: limited testing secondary to back pain. at least 3/5 functionally  LUE Strength LUE Overall Strength Comments: limited testing secondary to back pain. at least 3/5 functionally  Mobility  Bed Mobility Bed Mobility: Yes Rolling Right: 1: +2 Total assist;Patient percentage (comment) (pt 10% due to pain) Rolling Right Details (indicate cue type and reason): pt able to bend knees up for roll, verbalized understanding of log roll Rolling Left: 1: +2 Total assist;Patient percentage (comment) (pt 10%) Rolling Left Details (indicate cue type and reason): more painful than roll to right Right Sidelying to Sit: HOB flat;1: +2 Total assist;Patient percentage (comment) (pt 25%) Right Sidelying to Sit Details (indicate cue type and reason): pt able to get legs off bed on his own, assist given to sit up away from bed Transfers Sit to Stand: 1: +2 Total assist;Patient percentage (comment);From bed (pt 70%) Sit to Stand Details (indicate cue type and reason): vc's to push through legs to stand Stand to Sit: 1: +2 Total assist;Patient percentage (comment);With armrests;To chair/3-in-1 (pt 80%) Stand to Sit Details: vc's to control  sit Exercises   End of Session OT - End of Session Equipment Utilized During Treatment: Gait belt Activity Tolerance: Patient limited by pain Patient left: in chair;with call bell in reach Nurse Communication: Mobility status for transfers;Mobility status for ambulation General Behavior During Session: [facility] for tasks performed Cognition: [facility] for tasks performed   Ridley Dileo 09/26/2011, 3:30 PM

## 2011-09-26 NOTE — Progress Notes (Signed)
PT/OT Cancellation Note:  Therapy cancelled at this time as Dr. August Saucer was requesting therapy hold pending Neuro consult and Dr. Jeral Fruit has stated pt can be HOB up to 30 degrees with no mention of OOB. Will hold until OOB activity orders are clarified by Neuro. Thank you!  Anthony Hoover, OTR/L Pager: [phone] 09/26/2011  Lyanne Co, PT  Acute Rehab Services  [phone]

## 2011-09-26 NOTE — Progress Notes (Signed)
Physical Therapy Treatment Patient Details Name: Anthony Hoover MRN: [id number] DOB: 06-20-1966 Today's Date: 09/26/2011  PT Assessment/Plan  PT - Assessment/Plan Comments on Treatment Session: Pt progressed with therapy today, was able to ambulate.  Getting to sitting is the most painful for him which significantly limits his desire to mobilize.  Constant verbal cues for breathing and trying to relax.  Pt reports that it feels like he cannot take a deep breath and that something is stabbing into his left lung.  PT will continue to follow. PT Plan: Discharge plan remains appropriate;Frequency remains appropriate PT Frequency: Min 5X/week Follow Up Recommendations: Skilled nursing facility;Home health PT;Supervision for mobility/OOB Equipment Recommended: Rolling walker with 5" wheels PT Goals  Acute Rehab PT Goals PT Goal Formulation: With patient Time For Goal Achievement: 2 weeks Pt will Roll Supine to Right Side: with supervision PT Goal: Rolling Supine to Right Side - Progress: Progressing toward goal Pt will go Supine/Side to Sit: with supervision PT Goal: Supine/Side to Sit - Progress: Progressing toward goal Pt will go Sit to Supine/Side: with supervision PT Goal: Sit to Supine/Side - Progress: Progressing toward goal Pt will go Sit to Stand: with min assist PT Goal: Sit to Stand - Progress: Progressing toward goal Pt will go Stand to Sit: with min assist PT Goal: Stand to Sit - Progress: Progressing toward goal Pt will Ambulate: 51 - 150 feet;with least restrictive assistive device;with supervision PT Goal: Ambulate - Progress: Progressing toward goal Pt will Go Up / Down Stairs: 1-2 stairs;with min assist;with least restrictive assistive device PT Goal: Up/Down Stairs - Progress: Other (comment) (NT yet) Additional Goals Additional Goal #1: Pt will independently state and demonstrate adherence to 3/3 back precautions and verbalize brace wear. PT Goal: Additional Goal #1 -  Progress: Progressing toward goal Additional Goal #2: Pt will independently state and demonstrate adherence to 3/3 sternal precautions PT Goal: Additional Goal #2 - Progress: Progressing toward goal  PT Treatment Precautions/Restrictions  Precautions Precautions: Sternal;Fall;Back Precaution Booklet Issued: Yes (comment) Precaution Comments: Provided sternal precaution and back precaution sheet Required Braces or Orthoses: Yes Spinal Brace: Thoracolumbosacral orthotic;Applied in supine position Restrictions Weight Bearing Restrictions: No Mobility (including Balance) Bed Mobility Bed Mobility: Yes Rolling Right: 1: +2 Total assist;Patient percentage (comment) (pt 10% due to pain) Rolling Right Details (indicate cue type and reason): pt able to bend knees up for roll, verbalized understanding of log roll Rolling Left: 1: +2 Total assist;Patient percentage (comment) (pt 10%) Rolling Left Details (indicate cue type and reason): more painful than roll to right Right Sidelying to Sit: HOB flat;1: +2 Total assist;Patient percentage (comment) (pt 25%) Right Sidelying to Sit Details (indicate cue type and reason): pt able to get legs off bed on his own, assist given to sit up away from bed Transfers Transfers: Yes Sit to Stand: 1: +2 Total assist;Patient percentage (comment);From bed (pt 70%) Sit to Stand Details (indicate cue type and reason): vc's to push through legs to stand Stand to Sit: 1: +2 Total assist;Patient percentage (comment);With armrests;To chair/3-in-1 (pt 80%) Stand to Sit Details: vc's to control sit Ambulation/Gait Ambulation/Gait: Yes Ambulation/Gait Assistance: 1: +2 Total assist;Patient percentage (comment) (pt 75%) Ambulation/Gait Assistance Details (indicate cue type and reason): pt needed min A to move RW, especially with turning Ambulation Distance (Feet): 15 Feet Assistive device: Rolling walker Gait Pattern: Decreased stride length;Antalgic;Shuffle Gait  velocity: very slow Stairs: No Wheelchair Mobility Wheelchair Mobility: No  Posture/Postural Control Posture/Postural Control: No significant limitations Balance Balance Assessed: No  End of Session PT - End of Session Equipment Utilized During Treatment: Back brace Activity Tolerance: Patient limited by pain Patient left: in chair;with call bell in reach Nurse Communication: Mobility status for ambulation;Mobility status for transfers General Behavior During Session: [facility] for tasks performed Cognition: [facility] for tasks performed Co-treat with OT  Lavella Myren, [place]  [phone] 09/26/2011, 3:09 PM

## 2011-09-27 ENCOUNTER — Encounter (HOSPITAL_COMMUNITY): Payer: Self-pay | Admitting: Physical Medicine and Rehabilitation

## 2011-09-27 DIAGNOSIS — I639 Cerebral infarction, unspecified: Secondary | ICD-10-CM | POA: Insufficient documentation

## 2011-09-27 DIAGNOSIS — F419 Anxiety disorder, unspecified: Secondary | ICD-10-CM | POA: Insufficient documentation

## 2011-09-27 DIAGNOSIS — M549 Dorsalgia, unspecified: Secondary | ICD-10-CM | POA: Insufficient documentation

## 2011-09-27 DIAGNOSIS — S22009A Unspecified fracture of unspecified thoracic vertebra, initial encounter for closed fracture: Secondary | ICD-10-CM

## 2011-09-27 DIAGNOSIS — W1789XA Other fall from one level to another, initial encounter: Secondary | ICD-10-CM

## 2011-09-27 MED ORDER — OXYCODONE HCL 5 MG PO TABS
15.0000 mg | ORAL_TABLET | ORAL | Status: DC | PRN
  Administered 2011-09-29: 15 mg via ORAL
  Filled 2011-09-27: qty 3

## 2011-09-27 MED ORDER — HYDROMORPHONE 0.3 MG/ML IV SOLN
INTRAVENOUS | Status: AC
  Administered 2011-09-27: 14:00:00
  Filled 2011-09-27: qty 25

## 2011-09-27 MED ORDER — METHOCARBAMOL 500 MG PO TABS
1000.0000 mg | ORAL_TABLET | Freq: Four times a day (QID) | ORAL | Status: DC
  Administered 2011-09-27 – 2011-10-01 (×18): 1000 mg via ORAL
  Filled 2011-09-27 (×27): qty 2

## 2011-09-27 MED ORDER — OXYCODONE HCL 5 MG PO TABS
10.0000 mg | ORAL_TABLET | ORAL | Status: DC | PRN
  Filled 2011-09-27 (×4): qty 2

## 2011-09-27 MED ORDER — OXYCODONE HCL 5 MG PO TABS
20.0000 mg | ORAL_TABLET | ORAL | Status: DC | PRN
  Administered 2011-09-30: 20 mg via ORAL
  Filled 2011-09-27: qty 4

## 2011-09-27 NOTE — Progress Notes (Signed)
Patient's primary care physician, Dr. Janna Arch has agreed to manage patient's pain as an outpatient after D/C. Only did 750cc on IS for me Continue pain control and pulmonary toilet Patient examined and I agree with the assessment and plan  Violeta Gelinas, MD, MPH, FACS Pager: [phone]  09/27/2011 8:42 AM

## 2011-09-27 NOTE — Progress Notes (Signed)
Patient ID: Anthony Hoover, male   DOB: 01-Aug-1965, 46 y.o.   MRN: [id number]   LOS: 3 days   Subjective: Pt reports severe pain with attempts to mobilize OOB yesterday with therapies.  He reports severe spasms in ribs and back.  He is anxious about how he is going to manage at home.   Objective: Vital signs in last 24 hours: Temp:  [99.3 F (37.4 C)-100.3 F (37.9 C)] 99.4 F (37.4 C) (03/27 1300) Pulse Rate:  [91-122] 95  (03/28 0600) Resp:  [15-33] 17  (03/28 0600) BP: (129-151)/(73-98) 151/92 mmHg (03/28 0010) SpO2:  [85 %-97 %] 95 % (03/28 0600) Last BM Date: 09/24/11   IS:   Lab Results:  CBC  Basename 09/26/11 0445 09/25/11 0413  WBC 8.1 12.5*  HGB 9.6* 9.4*  HCT 28.8* 28.6*  PLT 140* 152   BMET  Basename 09/25/11 0413 09/24/11 2014  NA 136 137  K 4.3 4.5  CL 103 100  CO2 20 17*  GLUCOSE 127* 115*  BUN 8 9  CREATININE 0.69 0.80  CALCIUM 8.5 9.5    General appearance: alert and calm currently Resp: clear to auscultation bilaterally Cardio: RRR GI: +BS, some distention, but non tender, no guarding Pulses: 2+ and symmetric  Assessment/Plan: Fall Multiple left rib fxs -- Pain control and pulmonary toilet Right pulmonary contusions -- Mild T8,10 compression fxs -- Mobilize in brace per Dr. Jeral Fruit who was consulted by Dr. August Saucer T8-12 TVP fxs Sternal fx- Pain control and mobilization ABL anemia -- Moderate. Will recheck in am Chronic back pain -- Treated by Dr. Cecelia Byars in [place]. Will contact to see if he will take over pain management once he's discharged. S/p CVA EtOH use -- CIWA. Add beer. Tobacco use HTN -- Home meds FEN -- Diet, PT/OT, beer. Will give PCA + home dose methadone and add meds for break through pain, may have to go up on baseline Methadone in order to achieve good pain control.  VTE -- Lovenox Dispo -- Transfer to floor                 CIR consult   Shenouda Genova,PA-C Pager [phone] General Trauma Pager  [phone]   09/27/2011

## 2011-09-27 NOTE — Consult Note (Signed)
Physical Medicine and Rehabilitation Consult  Reason for Consult: Multi trauma Referring Phsyician: Trauma.    HPI: Anthony Hoover is an 46 y.o. male with history of HTN, chronic LBP, who fell out of a tree 3/26  while cutting branches, landed on his back and develop back pain soon after.  Admitted via  [facility] and workup done revealing multiple left rib fractures, pneumothorax with associated hemothorax, sternal fracture,  T8 and T10 burst fractures with disruption of vertebral bodies, nondisplaced T10 lamina fracture and nondisplaced spinous fractures T7, T9. Dr. Jeral Fruit consulted for input and recommended bracing. TLSO to be donned when supine.  PT evaluation done yesterday and patient limited by increased pain with transitional movements.   Review of Systems  HENT: Positive for congestion.   Eyes: Negative for blurred vision and double vision.  Respiratory: Negative for cough and shortness of breath.   Cardiovascular: Positive for chest pain.  Gastrointestinal: Positive for constipation.  Genitourinary: Negative for dysuria and urgency.  Musculoskeletal: Positive for back pain and joint pain.       Severe spasms back and chest  Neurological: Negative for tingling, sensory change, focal weakness and headaches.  Psychiatric/Behavioral: The patient is nervous/anxious and has insomnia.   All other systems reviewed and are negative.   Past Medical History  Diagnosis Date  . Hypertension   . Back pain     pain management by Dr. Janna Arch  . CVA (cerebral infarction)     2009  . Insomnia   . Anxiety disorder    Past Surgical History  Procedure Date  . Umbilical hernia repair    Family History  Problem Relation Age of Onset  . Diabetes Mother   . Cancer Father     Social History: Lives with mother and daughter. 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  He reports that he has been smoking  .5 packs per day-quit in '09 but resumed few months ago.Marland Kitchen He does not have any smokeless tobacco  history on file. He reports that he drinks about 15-20 beer daily. He reports that he does not use illicit drugs. Mother can provide supervision past discharge.  Sister in law is a  CNA and currently unemployed/ can assist past discharge?   No Known Allergies  Prior to Admission medications   Medication Sig Start Date End Date Taking? Authorizing Provider  ALPRAZolam Prudy Feeler) 1 MG tablet Take 1 mg by mouth at bedtime as needed.   Yes Historical Provider, MD  aspirin EC 81 MG tablet Take 81 mg by mouth at bedtime.    Yes Historical Provider, MD  lansoprazole (PREVACID) 30 MG capsule Take 30 mg by mouth daily as needed. For acid reflux   Yes Historical Provider, MD  methadone (DOLOPHINE) 10 MG tablet Take 20 mg by mouth 3 (three) times daily.    Yes Historical Provider, MD  Sildenafil Citrate (VIAGRA PO) Take 0.5-1 tablets by mouth as needed. For intercourse   Yes Historical Provider, MD   Scheduled Medications:    . aspirin EC  81 mg Oral QHS  . enoxaparin  40 mg Subcutaneous Daily  . folic acid  1 mg Oral Daily  . HYDROmorphone PCA 0.3 mg/mL   Intravenous Q4H  . HYDROmorphone PCA 0.3 mg/mL      . methadone  20 mg Oral Q8H  . methocarbamol  1,000 mg Oral QID  . mulitivitamin with minerals  1 tablet Oral Daily  . pantoprazole  40 mg Oral Q1200  . spiritus  frumenti  2 each Oral BID WC  . thiamine  100 mg Oral Daily  . DISCONTD: methocarbamol  1,000 mg Oral TID   PRN MED's: diphenhydrAMINE, diphenhydrAMINE, LORazepam, LORazepam, naloxone, ondansetron (ZOFRAN) IV, ondansetron, oxyCODONE, oxyCODONE, oxyCODONE, sodium chloride  Home: Home Living Lives With: Daughter;Other (Comment) Type of Home: House Home Layout: One level Home Access: Stairs to enter [employer] of Steps: small threshold Bathroom Shower/Tub: Tub/shower unit;Walk-in shower;Curtain Bathroom Toilet: Standard Home Adaptive Equipment: Straight cane  Functional History: Prior Function Level of Independence:  Independent with basic ADLs;Independent with homemaking with wheelchair;Independent with transfers;Independent with gait Driving: Yes Vocation: Unemployed Comments: Dgtr is in school and mother at home but mother cannot provide greater than supervision for mobility Functional Status:  Mobility: Bed Mobility Bed Mobility: Yes Rolling Right: 1: +2 Total assist;Patient percentage (comment) (pt 10% due to pain) Rolling Right Details (indicate cue type and reason): pt able to bend knees up for roll, verbalized understanding of log roll Rolling Left: 1: +2 Total assist;Patient percentage (comment) (pt 10%) Rolling Left Details (indicate cue type and reason): more painful than roll to right Right Sidelying to Sit: HOB flat;1: +2 Total assist;Patient percentage (comment) (pt 25%) Right Sidelying to Sit Details (indicate cue type and reason): pt able to get legs off bed on his own, assist given to sit up away from bed Sit to Sidelying Right: 3: Mod assist;HOB flat Sit to Sidelying Right Details (indicate cue type and reason): assist to bring legs into bed and control trunk to surface with VC throughout for breathing Transfers Transfers: Yes Sit to Stand: 1: +2 Total assist;Patient percentage (comment);From bed (pt 70%) Sit to Stand Details (indicate cue type and reason): vc's to push through legs to stand Stand to Sit: 1: +2 Total assist;Patient percentage (comment);With armrests;To chair/3-in-1 (pt 80%) Stand to Sit Details: vc's to control sit Stand Pivot Transfers: 3: Mod assist Stand Pivot Transfer Details (indicate cue type and reason): use of RW with assist to direct and turn RW.  Ambulation/Gait Ambulation/Gait: Yes Ambulation/Gait Assistance: 1: +2 Total assist;Patient percentage (comment) (pt 75%) Ambulation/Gait Assistance Details (indicate cue type and reason): pt needed min A to move RW, especially with turning Ambulation Distance (Feet): 15 Feet Assistive device: Rolling walker Gait  Pattern: Decreased stride length;Antalgic;Shuffle Gait velocity: very slow Stairs: No Wheelchair Mobility Wheelchair Mobility: No  ADL: ADL Eating/Feeding: Performed;Minimal assistance Eating/Feeding Details (indicate cue type and reason): Min A to bring cup with straw to mouth Where Assessed - Eating/Feeding: Chair Grooming: Simulated;Moderate assistance Grooming Details (indicate cue type and reason): set up and bringing objects to/using at face Where Assessed - Grooming: Sitting, chair Upper Body Bathing: Not assessed Lower Body Bathing: +1 Total assistance;Simulated Where Assessed - Lower Body Bathing: Rolling right and/or left Upper Body Dressing: Maximal assistance;Performed Upper Body Dressing Details (indicate cue type and reason): gown Where Assessed - Upper Body Dressing: Sitting, bed Lower Body Dressing: Simulated;+2 Total assistance Lower Body Dressing Details (indicate cue type and reason): +2 needed to steady pt while other person pulls up pants/underwear Where Assessed - Lower Body Dressing: Sit to stand from bed Toilet Transfer: Simulated;+2 Total assistance (pt=70% ) Toilet Transfer Details (indicate cue type and reason): simulated EOB to chair sit to stand Toileting - Clothing Manipulation: Simulated;+1 Total assistance Toileting - Hygiene: Simulated;+1 Total assistance Where Assessed - Toileting Hygiene: Standing Tub/Shower Transfer: Not assessed Equipment Used: Rolling walker Ambulation Related to ADLs: +2total A(pt=75%) RW ambulation.  ADL Comments: Pt pre-medicated for pain, anxiety and  spasms prior to therapy. Educated pt on pursed lip breathing and enouraged practicing this with transfers as pt with tendency to hold breath due to pain  Cognition: Cognition Arousal/Alertness: Awake/alert Orientation Level: Oriented X4 Cognition Arousal/Alertness: Awake/alert Overall Cognitive Status: Appears within functional limits for tasks assessed Orientation Level:  Oriented X4  Blood pressure 151/92, pulse 95, temperature 99.4 F (37.4 C), temperature source Core (Comment), resp. rate 15, height 5\' 9"  (1.753 m), weight 82.6 kg (182 lb 1.6 oz), SpO2 98.00%. Physical Exam  Constitutional: He is oriented to person, place, and time and well-developed, well-nourished, and in no distress.  HENT:  Head: Normocephalic and atraumatic.  Eyes: Pupils are equal, round, and reactive to light.  Neck: Normal range of motion.  Cardiovascular: Normal rate and regular rhythm.   Pulmonary/Chest: He has decreased breath sounds in the left lower field. He has no wheezes.  Abdominal: He exhibits distension. Bowel sounds are hypoactive. There is no tenderness. There is no guarding.  Musculoskeletal: He exhibits no edema and no tenderness.  Neurological: He is alert and oriented to person, place, and time.  Psychiatric: Memory and judgment normal. His mood appears anxious.   motor strength is 5/5 in bilateral upper extremities 5/5 strength in bilateral lower extremities Sensation is intact to light touch and proprioception in bilateral upper and lower extremities Patient exhibits pain behaviors with even minimal  flexion even with TLSO on Click with inspiration on left No results found for this or any previous visit (from the past 24 hour(s)). Dg Chest Port 1 View  09/26/2011  *RADIOLOGY REPORT*  Clinical Data: Follow up of pulmonary contusion.  PORTABLE CHEST - 1 VIEW  Comparison: 1 day prior  Findings: Patient rotated to the right.  Normal heart size. No pleural fluid.  Low lung volumes.  The left-sided pleural fluid and tiny pneumothorax are not readily apparent.  Patchy right upper lobe airspace disease is suspected. Left-sided rib fractures.  IMPRESSION:  1.  Suspect developing right upper lobe airspace disease. 2.  Lack of visualization of previous described left apical pneumothorax or pleural fluid.  Original Report Authenticated By: Consuello Bossier, M.D.     Assessment/Plan: Diagnosis: Thoracic compression fractures T8 and T10 as well as left-sided rib fractures 1. Does the need for close, 24 hr/day medical supervision in concert with the patient's rehab needs make it unreasonable for this patient to be served in a less intensive setting? Yes 2. Co-Morbidities requiring supervision/potential complications: Chronic pain on chronic narcotics, alcohol abuse, acute blood loss anemia, hypertension 3. Due to bowel management, safety, skin/wound care, disease management, medication administration, pain management and patient education, does the patient require 24 hr/day rehab nursing? Potentially 4. Does the patient require coordinated care of a physician, rehab nurse, PT (1-2 hrs/day, 5 days/week) and OT (1-2 hrs/day, 5 days/week) to address physical and functional deficits in the context of the above medical diagnosis(es)? Potentially Addressing deficits in the following areas: balance, endurance, locomotion, strength, transferring, bathing, dressing and toileting 5. Can the patient actively participate in an intensive therapy program of at least 3 hrs of therapy per day at least 5 days per week? Potentially 6. The potential for patient to make measurable gains while on inpatient rehab is fair 7. Anticipated functional outcomes upon discharge from inpatients are supervision mobility PT, supervision in upper body and min assist lower body ADLs OT, not applicable SLP 8. Estimated rehab length of stay to reach the above functional goals is: 2 weeks 9. Does the patient  have adequate social supports to accommodate these discharge functional goals? Potentially 10. Anticipated D/C setting: Home 11. Anticipated post D/C treatments: HH therapy 12. Overall Rehab/Functional Prognosis: fair  RECOMMENDATIONS: This patient's condition is appropriate for continued rehabilitative care in the following setting: At this point the patient cannot tolerate inpatient  rehabilitation. He will need to be off of his PCA and tolerating therapy on by mouth medications Patient has agreed to participate in recommended program. Potentially Note that insurance prior authorization may be required for reimbursement for recommended care.  Comment:   Jacquelynn Cree 09/27/2011

## 2011-09-27 NOTE — Progress Notes (Signed)
Patient ID: Anthony Hoover, male   DOB: 06/02/1966, 46 y.o.   MRN: [id number] Decrease of spasms. No weakness. Continue pt/ot

## 2011-09-27 NOTE — Progress Notes (Signed)
Physical Therapy Treatment Patient Details Name: Anthony Hoover MRN: [id number] DOB: Sep 22, 1965 Today's Date: 09/27/2011  PT Assessment/Plan  PT - Assessment/Plan Comments on Treatment Session: Pt s/p fall with sternal, rib, and thoracic fx. Pt progressing but limited with respiratory status due to sternal fx. Pt only able to recall 1/3 back precautions and no sternal precautions but reeducated for all and handouts in his lap in WC end of session for transfer to 3000. Pt benefits from encouragement and reassurance and pause between all transitional movements to regain breathing control. Total assist +2 to don brace supine. Will continue to follow to maximize mobility. Pt IV not securely fastened and RN notified at initiation of mobility during sitting to chair IV pulled out and RN again notified and dressing applied to pt hand. PT Plan: Discharge plan remains appropriate PT Goals  Acute Rehab PT Goals PT Goal: Rolling Supine to Right Side - Progress: Progressing toward goal PT Goal: Supine/Side to Sit - Progress: Progressing toward goal PT Goal: Sit to Supine/Side - Progress: Progressing toward goal PT Goal: Sit to Stand - Progress: Progressing toward goal PT Goal: Stand to Sit - Progress: Progressing toward goal PT Goal: Ambulate - Progress: Progressing toward goal PT Goal: Up/Down Stairs - Progress: Progressing toward goal Additional Goals PT Goal: Additional Goal #1 - Progress: Progressing toward goal PT Goal: Additional Goal #2 - Progress: Progressing toward goal  PT Treatment Precautions/Restrictions  Precautions Precautions: Sternal;Back;Fall Precaution Booklet Issued: Yes (comment) Precaution Comments: Provided sternal precaution and back precaution sheet Required Braces or Orthoses: Yes Spinal Brace: Thoracolumbosacral orthotic;Applied in supine position Restrictions Weight Bearing Restrictions: No Mobility (including Balance) Bed Mobility Rolling Right: 3: Mod  assist Rolling Right Details (indicate cue type and reason): cueing and assist to maintain all precautions x 2 for brace donning and prep to sit Rolling Left: 3: Mod assist Rolling Left Details (indicate cue type and reason): cueing and assist to maintain all precautions Right Sidelying to Sit: 1: +2 Total assist;HOB flat;Patient percentage (comment) (pt=25%) Right Sidelying to Sit Details (indicate cue type and reason): facilitation for trunk and swift transfer secondary to pt increased pain and difficulty breathing during transitional movements Transfers Sit to Stand: 4: Min assist;From bed Sit to Stand Details (indicate cue type and reason): cueing for hands on thighs and assist for anterior translation Stand to Sit: 4: Min assist;To chair/3-in-1 Stand to Sit Details: cueing for precuations and assist to control descent Ambulation/Gait Ambulation/Gait Assistance: 4: Min assist Ambulation/Gait Assistance Details (indicate cue type and reason): +1 to follow with WC and O2 secondary to pt transferring units at end of session. Initially min assist to direct and advance RW but pt able to perform after 50' Ambulation Distance (Feet): 110 Feet Assistive device: Rolling walker Gait Pattern: Decreased stride length;Step-through pattern;Antalgic Gait velocity: slowly Stairs: No  Posture/Postural Control Posture/Postural Control: No significant limitations Exercise    End of Session PT - End of Session Equipment Utilized During Treatment: Back brace Activity Tolerance: Patient limited by pain Patient left: in chair Nurse Communication: Mobility status for transfers;Mobility status for ambulation General Behavior During Session: [facility] for tasks performed Cognition: [facility] for tasks performed  Delorse Lek 09/27/2011, 12:53 PM Delaney Meigs, PT [phone]

## 2011-09-27 NOTE — Progress Notes (Signed)
10:05 Report called to Elie Confer. Patient to be transferred with tech and RN to [id number]. Rn discussed patient reason for admission, history, and assessment as charted at 0800. PCA dose and pt pain level discusses and PCA will be transferred with patient. TLSO brace discussed as well as need to log roll patient for spinal stabilization. Patients issues at this time are pain management and distended abdomen. Patient reports passing gas but has not had a bowel movement. MD aware. Will continue to monitor until patient discharged.

## 2011-09-28 LAB — CBC
MCH: 33.7 pg (ref 26.0–34.0)
MCHC: 33.5 g/dL (ref 30.0–36.0)
MCV: 100.7 fL — ABNORMAL HIGH (ref 78.0–100.0)
Platelets: 184 10*3/uL (ref 150–400)

## 2011-09-28 LAB — BASIC METABOLIC PANEL
CO2: 29 mEq/L (ref 19–32)
Calcium: 9.2 mg/dL (ref 8.4–10.5)
Creatinine, Ser: 0.66 mg/dL (ref 0.50–1.35)
GFR calc non Af Amer: >90 mL/min (ref >90)
Sodium: 132 mEq/L — ABNORMAL LOW (ref 135–145)

## 2011-09-28 MED ORDER — KETOROLAC TROMETHAMINE 15 MG/ML IJ SOLN
15.0000 mg | Freq: Four times a day (QID) | INTRAMUSCULAR | Status: DC
  Administered 2011-09-28 – 2011-10-01 (×13): 15 mg via INTRAVENOUS
  Filled 2011-09-28 (×16): qty 1

## 2011-09-28 MED ORDER — OXYCODONE HCL 5 MG PO TABS
5.0000 mg | ORAL_TABLET | ORAL | Status: DC
  Administered 2011-09-28 – 2011-09-30 (×11): 5 mg via ORAL
  Filled 2011-09-28 (×10): qty 1

## 2011-09-28 NOTE — Progress Notes (Signed)
Occupational Therapy Treatment Patient Details Name: Anthony Hoover MRN: [id number] DOB: Dec 20, 1965 Today's Date: 09/28/2011  OT Assessment/Plan OT Assessment/Plan Comments on Treatment Session: Pt. demonstrates improving ability to assit/perform self care activities.  Pt. continues to be limited by pain.  Pt. requires total A + (pt. 0%) to don brace supine. OT Plan: Discharge plan remains appropriate OT Frequency: Min 2X/week Follow Up Recommendations: Inpatient Rehab Equipment Recommended: Defer to next venue OT Goals ADL Goals ADL Goal: Lower Body Dressing - Progress: Progressing toward goals ADL Goal: Toilet Transfer - Progress: Progressing toward goals  OT Treatment Precautions/Restrictions  Precautions Precautions: Sternal;Back;Fall Required Braces or Orthoses: Yes Spinal Brace: Thoracolumbosacral orthotic;Applied in supine position Restrictions Weight Bearing Restrictions: No   ADL ADL Lower Body Dressing: Performed;+1 Total assistance Lower Body Dressing Details (indicate cue type and reason): Pt. donned shorts supine - able to pull pants over feet with min A for Lt. foot.  He was able to pull pants to his hips, but requires total A +2 to log roll Lt. and Rt. due to severe pain Lt. ribs and sternum.    Where Assessed - Lower Body Dressing: Rolling right and/or left Ambulation Related to ADLs: Pt. abmulated with total A +2 with RW (~75-85%).  Co-treat with PT.    ADL Comments: Pt. movitivated, but limited by severe pain.  Pt. instructed in deep breathing exercises. Mobility  Bed Mobility Bed Mobility: Yes Rolling Right: 1: +2 Total assist Rolling Right Details (indicate cue type and reason): Pt. performs with ~25-40% due to pain Rolling Left: 1: +2 Total assist Rolling Left Details (indicate cue type and reason): Pt. ~25-40% due to pain Right Sidelying to Sit: 1: +2 Total assist;HOB flat;Patient percentage (comment) Right Sidelying to Sit Details (indicate cue type and  reason): Pt. performed ~60% Transfers Transfers: Yes Sit to Stand: 4: Min assist;From bed Stand to Sit: 4: Min assist;To chair/3-in-1 Exercises    End of Session OT - End of Session Activity Tolerance: Patient limited by pain Patient left: in chair;with call bell in reach Nurse Communication: Mobility status for transfers;Mobility status for ambulation General Behavior During Session: [facility] for tasks performed Cognition: [facility] for tasks performed  Erendida Wrenn, Ursula Alert M  09/28/2011, 1:04 PM

## 2011-09-28 NOTE — Progress Notes (Signed)
I met with patient, daughter, and his Mom at bedside yesterday afternoon. Encouraged up activity and a bowel regimen to include using the [facility] to progress his activity level as much as possible. Would like to admit patient to inpatient acute rehab once off PCA and patient able to tolerate more activity. I will follow up today. Please call for any questions. Pager [phone]

## 2011-09-28 NOTE — Progress Notes (Signed)
Clinical Social Work Department BRIEF PSYCHOSOCIAL ASSESSMENT 09/28/2011  Patient:  Anthony Hoover, Anthony Hoover     Account Number:  [id number]     Admit date:  09/24/2011  Clinical Social Worker:  Pearson Forster  Date/Time:  09/27/2011 03:36 PM  Referred by:  Physician  Date Referred:  09/27/2011 Referred for  Other - See comment   Other Referral:   SBIRT COMPLETION / Questionable placement   Interview type:  Patient Other interview type:    PSYCHOSOCIAL DATA Living Status:  FAMILY Admitted from facility:   Level of care:   Primary support name:  Akram, [place]  [phone] Primary support relationship to patient:  PARENT Degree of support available:   Adequate    CURRENT CONCERNS Current Concerns  Other - See comment   Other Concerns:   Questionable rehab needs    SOCIAL WORK ASSESSMENT / PLAN Clinical Social Worker met with patient to conduct SBIRT and offer support.  Per patient, his injuries were a result of falling out of a tree while cutting limbs.  Patient is being evaluated for CIR for rehab upon discharge.  Patient is agreeable to SNF placement for rehab if CIR is not an option.  Patient states that he lives with his mother and daughter and that his mother and sister in law could provide 24hr support at home.  Per patient, he does consume a large quantitiy of beer up to 4 times a week but does not feel that his drinking habit interferes with his daily living.  No resources provided at this time.  CSW to facilitate patient discharge needs once medically ready for discharge.   Assessment/plan status:  Psychosocial Support/Ongoing Assessment of Needs Other assessment/ plan:   Information/referral to community resources:   none at this time    PATIENT'S/FAMILY'S RESPONSE TO PLAN OF CARE: Patient stated that he would prefer to recieve therapy in CIR if that is an option, but patient stated that he would be agreeable to SNF placement in [facility]  if necessary.  Patient  stated that his mother and sister in law could provide support as needed.    Arnette Norris, MSW Intern  [place], [place] [phone]

## 2011-09-28 NOTE — Progress Notes (Signed)
Clinical Social Worker following patient for emotional support and discharge needs.  Per chart, patient plans to admit to inpatient rehab in the next few days.  Clinical Social Worker will sign off for now as social work intervention is no longer needed. Please consult [place] again if new need arises.  [address] [place], [place] [phone]

## 2011-09-28 NOTE — Progress Notes (Signed)
Improving.  This patient has been seen and I agree with the findings and treatment plan.  Marta Lamas. Gae Bon, MD, FACS [phone] (pager) [phone] (direct pager) Trauma Surgeon

## 2011-09-28 NOTE — Progress Notes (Signed)
Physical Therapy Treatment Patient Details Name: Anthony Hoover MRN: [id number] DOB: [birth year] Today's Date: 09/28/2011  PT Assessment/Plan  PT - Assessment/Plan Comments on Treatment Session: Pt progressing with treatment, although bed mobility still limited by 10/10 pain. Pt able to recall 3/3 back precautions as well as sternal precautions and was able to perform treatment safely while maintaining precautions. Pt still requires cueing throughout treatment for deep breathing throughout as well as other relaxation techniques to assist with mobility. PT Plan: Discharge plan remains appropriate;Frequency remains appropriate PT Frequency: Min 5X/week Follow Up Recommendations: Skilled nursing facility;Home health PT;Supervision for mobility/OOB Equipment Recommended: Defer to next venue PT Goals  Acute Rehab PT Goals PT Goal Formulation: With patient PT Goal: Rolling Supine to Right Side - Progress: Not progressing PT Goal: Supine/Side to Sit - Progress: Progressing toward goal PT Goal: Sit to Supine/Side - Progress: Progressing toward goal PT Goal: Sit to Stand - Progress: Met PT Goal: Stand to Sit - Progress: Met PT Goal: Ambulate - Progress: Progressing toward goal Additional Goals PT Goal: Additional Goal #1 - Progress: Met PT Goal: Additional Goal #2 - Progress: Progressing toward goal  PT Treatment Precautions/Restrictions  Precautions Precautions: Sternal;Back;Fall Precaution Booklet Issued: Yes (comment) Precaution Comments: Provided sternal precaution and back precaution sheet Required Braces or Orthoses: Yes Spinal Brace: Thoracolumbosacral orthotic;Applied in supine position Restrictions Weight Bearing Restrictions: No Mobility (including Balance) Bed Mobility Bed Mobility: Yes Rolling Right: 1: +2 Total assist Rolling Right Details (indicate cue type and reason): Pt. performs with ~25-40% due to pain. VC for proper sequencing and safety with back and sternal  precautions Rolling Left: 1: +2 Total assist Rolling Left Details (indicate cue type and reason): Pt. performs with ~25-40% due to pain Right Sidelying to Sit: 1: +2 Total assist;HOB flat;Patient percentage (comment) Right Sidelying to Sit Details (indicate cue type and reason): Pt. performed ~60%. VC for sequencing and hand placement throughout. Assist through trunk and pelvis for support and tactile cues into sitting. Transfers Transfers: Yes Sit to Stand: 4: Min assist;From bed Sit to Stand Details (indicate cue type and reason): VC for hand placement on thighs to maintain sternal precautions as well as assist for trunk control and anterior translation into standing Stand to Sit: 4: Min assist;To chair/3-in-1 Stand to Sit Details: VC for safety and sequencing to maintain precautions Ambulation/Gait Ambulation/Gait: Yes Ambulation/Gait Assistance: 4: Min assist Ambulation/Gait Assistance Details (indicate cue type and reason): Min assist for support throughout treatment. VC for safety with distance to RW. Assist needed for physical movement of RW. (VC for upright posture and anterior pelvic tilt.) Ambulation Distance (Feet): 80 Feet Assistive device: Rolling walker Gait Pattern: Decreased stride length;Step-through pattern;Antalgic Gait velocity: decreased cadence Stairs: No    Exercise    End of Session PT - End of Session Equipment Utilized During Treatment: Back brace;Gait belt Activity Tolerance: Patient limited by pain Patient left: in chair Nurse Communication: Mobility status for transfers;Mobility status for ambulation General Behavior During Session: [facility] for tasks performed Cognition: [facility] for tasks performed  Milana Kidney 09/28/2011, 4:10 PM  09/28/2011 Milana Kidney DPT PAGER: [phone] OFFICE: [phone]

## 2011-09-28 NOTE — Progress Notes (Signed)
Patient ID: Anthony Hoover, male   DOB: 04/02/1966, 46 y.o.   MRN: [id number]   LOS: 4 days   Subjective: Pt continues to have severe pain with movement, especially in left ribs, but he did mobilize better today with therapies.  Objective: Vital signs in last 24 hours: Temp:  [97.6 F (36.4 C)-99.7 F (37.6 C)] 98 F (36.7 C) (03/29 1005) Pulse Rate:  [81-104] 104  (03/29 1005) Resp:  [16-18] 18  (03/29 1005) BP: (124-139)/(85-90) 124/85 mmHg (03/29 1005) SpO2:  [94 %-98 %] 97 % (03/29 1005) Last BM Date: 09/24/11   IS: highest to   Lab Results:  CBC  Basename 09/28/11 0528 09/26/11 0445  WBC 10.0 8.1  HGB 9.1* 9.6*  HCT 27.2* 28.8*  PLT 184 140*   BMET  Basename 09/28/11 0528  NA 132*  K 3.8  CL 92*  CO2 29  GLUCOSE 122*  BUN 12  CREATININE 0.66  CALCIUM 9.2    General appearance: alert and in mild distress sitting up in chair currently Resp: clear to auscultation bilaterally Cardio: RRR, slightly tachycardic GI: Pt up in chair in TLSO brace Pulses: 2+ and symmetric  Assessment/Plan: Fall Multiple left rib fxs -- Pain control and pulmonary toilet Right pulmonary contusions -- Mild T8,10 compression fxs -- Mobilize in brace per Dr. Jeral Fruit who was consulted by Dr. August Saucer T8-12 TVP fxs Sternal fx- Pain control and mobilization ABL anemia -- Moderate. Will recheck in am Chronic back pain -- Treated by Dr. Cecelia Byars in [place]. Will contact to see if he will take over pain management once he's discharged. S/p CVA EtOH use -- CIWA. Add beer. Tobacco use HTN -- Home meds FEN -- Mildly hyponatremic, tolerating some po's VTE -- Lovenox Dispo --Continue therapies. Add Toradol, Hopefully can DC PCA over next 24 hours.    Franki Monte Pager [phone] General Trauma Pager [phone]   09/28/2011

## 2011-09-29 MED ORDER — HYDROMORPHONE 0.3 MG/ML IV SOLN
INTRAVENOUS | Status: AC
  Filled 2011-09-29: qty 25

## 2011-09-29 NOTE — Progress Notes (Signed)
Physical Therapy Treatment Patient Details Name: Anthony Hoover MRN: [id number] DOB: 09/15/1965 Today's Date: 09/29/2011  PT Assessment/Plan  PT - Assessment/Plan Comments on Treatment Session: Increased activity tolerance today with increased gait distance and less assist needed with all aspects of mobility. Will need assist at home for don/doff brace as pt will not be able to achieve indepent with this due to multiple precautions (back and sternum). Requires max-constanct cues throughout session for breathing, as pt tends to hold breath with mobiltiy.                    PT Plan: Discharge plan remains appropriate;Frequency remains appropriate PT Frequency: Min 5X/week Follow Up Recommendations: Skilled nursing facility;Home health PT;Supervision for mobility/OOB (will depend on pt's functional improvement and home support) Equipment Recommended: Defer to next venue (will be updated if pt is to go home with home health) PT Goals  Acute Rehab PT Goals PT Goal: Rolling Supine to Right Side - Progress: Progressing toward goal PT Goal: Supine/Side to Sit - Progress: Progressing toward goal PT Goal: Sit to Stand - Progress: Met PT Goal: Stand to Sit - Progress: Met PT Goal: Ambulate - Progress: Progressing toward goal Additional Goals PT Goal: Additional Goal #1 - Progress: Progressing toward goal PT Goal: Additional Goal #2 - Progress: Progressing toward goal  PT Treatment Precautions/Restrictions  Precautions Precautions: Back;Sternal;Fall Precaution Booklet Issued: Yes (comment) Precaution Comments: pt able to recall 3/3 back precautions and 2/3 sternal precautions. needed max cues to demo sternal precautions and not place increased wt/pressure on arms with sitting at edge of bed and with gait with RW. Required Braces or Orthoses: Yes Spinal Brace: Thoracolumbosacral orthotic;Applied in supine position (total assist of 2 to don brace in bed) Restrictions Weight Bearing Restrictions:  No Mobility (including Balance) Bed Mobility Rolling Right: 4: Min assist (pt= 75-80%) Rolling Right Details (indicate cue type and reason): cues for logroll (using bent knees to "pull shoulders with them to keep back straight). pt held pillow to sternum for support and was able to use abdominals to assist with rolling side to side to don brace Rolling Left: 4: Min assist (pt= 75-80%) Rolling Left Details (indicate cue type and reason): bed flat and cues for technique. see previous comments Right Sidelying to Sit: HOB flat;3: Mod assist (pt= 60-70%) Right Sidelying to Sit Details (indicate cue type and reason): cues to bring legs off bed and use that momentum with abodominals to assist with sitting up. most assist needed with trunk transition into sitting. pt held pillow to chest for increased support and was able to place some wt on right elbow to assist with trunk transition. Transfers Sit to Stand: 4: Min assist;From bed;Without upper extremity assist (pt= 75-80%) Sit to Stand Details (indicate cue type and reason): pt held pillow to chest for support. cues for increased ant wt shift to get wt over feet to assist with standing.  Stand to Sit: 4: Min assist;With upper extremity assist;To chair/3-in-1;With armrests Stand to Sit Details: pt with controlled descent with minimal pressure on arms with sitting, used legs to lower slowly into chair. min assist for controlled descent. Ambulation/Gait Ambulation/Gait Assistance: 4: Min assist (progressed to min guard assist) Ambulation/Gait Assistance Details (indicate cue type and reason): min cues for posture and walker position with gait (pt with tendency to push walker too far out). Ambulation Distance (Feet): 200 Feet Assistive device: Rolling walker Gait Pattern: Step-through pattern;Decreased stride length;Trunk flexed Gait velocity: decreased intially, progressed with increased speed  throughout  Posture/Postural Control Posture/Postural  Control: No significant limitations   End of Session PT - End of Session Equipment Utilized During Treatment: Gait belt;Back brace Activity Tolerance: Patient tolerated treatment well;Patient limited by pain Patient left: in chair;with call bell in reach Nurse Communication: Mobility status for transfers;Mobility status for ambulation General Behavior During Session: [facility] for tasks performed Cognition: [facility] for tasks performed  Sallyanne Kuster 09/29/2011, 11:13 AM  Sallyanne Kuster, PTA Office- [phone] Pager- [phone]

## 2011-09-29 NOTE — Progress Notes (Signed)
Patient ID: Anthony Hoover, male   DOB: 1966-03-20, 46 y.o.   MRN: [id number]   LOS: 5 days   Subjective: Does well with ambulation once he is up, but still needs help with all transfers, don/doff of TLSO and ADL. Still feel he would benefit from short inpatient rehab.  Objective: Vital signs in last 24 hours: Temp:  [98 F (36.7 C)-99.5 F (37.5 C)] 98 F (36.7 C) (03/30 1000) Pulse Rate:  [64-97] 80  (03/30 1000) Resp:  [13-18] 18  (03/30 1000) BP: (124-154)/(83-97) 150/97 mmHg (03/30 1058) SpO2:  [95 %-100 %] 96 % (03/30 1058) Last BM Date: 09/24/11   IS: highest to   Lab Results:  CBC  Basename 09/28/11 0528  WBC 10.0  HGB 9.1*  HCT 27.2*  PLT 184   BMET  Basename 09/28/11 0528  NA 132*  K 3.8  CL 92*  CO2 29  GLUCOSE 122*  BUN 12  CREATININE 0.66  CALCIUM 9.2    General appearance: alert and in mild distress sitting up in chair currently Resp: clear to auscultation bilaterally Cardio: RRR, slightly tachycardic GI: Pt up in chair in TLSO brace Pulses: 2+ and symmetric  Assessment/Plan: Fall Multiple left rib fxs -- Pain control and pulmonary toilet, Will DC PCA in am Right pulmonary contusions -- Mild T8,10 compression fxs -- Mobilize in brace per Dr. Jeral Fruit who was consulted by Dr. August Saucer T8-12 TVP fxs Sternal fx- Pain control and mobilization ABL anemia -- Moderate. Will recheck in am Chronic back pain -- Treated by Dr. Cecelia Byars in [place]. Will contact to see if he will take over pain management once he's discharged. S/p CVA EtOH use -- CIWA. Add beer. Tobacco use HTN -- Home meds FEN -- Mildly hyponatremic, tolerating some po's VTE -- Lovenox Dispo --Continue therapies. DC PCA in am.   Hopefully to [facility] 4/1   Isabel Ardila,PA-C Pager [phone] General Trauma Pager [phone]   09/29/2011

## 2011-09-30 MED ORDER — HYDROMORPHONE HCL PF 1 MG/ML IJ SOLN
1.0000 mg | INTRAMUSCULAR | Status: DC | PRN
  Filled 2011-09-30: qty 1

## 2011-09-30 MED ORDER — OXYCODONE HCL 5 MG PO TABS
10.0000 mg | ORAL_TABLET | ORAL | Status: DC
  Administered 2011-09-30 – 2011-10-01 (×7): 10 mg via ORAL
  Filled 2011-09-30 (×4): qty 2

## 2011-09-30 NOTE — Progress Notes (Signed)
Logrolls to upright position. Brace applied. Up with walker to chair. Tolerated fair. Pt states does not really want the beer to drink. Has not wanted any today.

## 2011-09-30 NOTE — Progress Notes (Signed)
Patient ID: Anthony Hoover, male   DOB: 02/10/1966, 46 y.o.   MRN: [id number]   LOS: 6 days   Subjective: Continues to make progress Okay with Dc of PCA Objective: Vital signs in last 24 hours: Temp:  [98 F (36.7 C)-99 F (37.2 C)] 98.6 F (37 C) (03/31 0600) Pulse Rate:  [76-86] 85  (03/31 0600) Resp:  [18] 18  (03/31 0600) BP: (127-150)/(86-97) 137/86 mmHg (03/31 0600) SpO2:  [96 %-99 %] 96 % (03/31 0600) Last BM Date: 09/24/11   IS: highest to   Lab Results:  CBC  Basename 09/28/11 0528  WBC 10.0  HGB 9.1*  HCT 27.2*  PLT 184   BMET  Basename 09/28/11 0528  NA 132*  K 3.8  CL 92*  CO2 29  GLUCOSE 122*  BUN 12  CREATININE 0.66  CALCIUM 9.2    General appearance: alert and no distress Resp: clear to auscultation bilaterally Cardio: RRR, slightly tachycardic GI: +BS, soft Pulses: 2+ and symmetric  Assessment/Plan: Fall Multiple left rib fxs -- Pain control and pulmonary toilet, Will DC PCA  Right pulmonary contusions -- Mild T8,10 compression fxs -- Mobilize in brace per Dr. Jeral Fruit who was consulted by Dr. August Saucer T8-12 TVP fxs Sternal fx- Pain control and mobilization ABL anemia -- Moderate. Stable Chronic back pain -- Treated by Dr. Cecelia Byars in [place]. Will contact to see if he will take over pain management once he's discharged. S/p CVA EtOH use -- CIWA and beer. Tobacco use HTN -- Home meds FEN -- Mildly hyponatremic, tolerating some po's VTE -- Lovenox Dispo --Continue therapies. DC PCA and increase OxyIR scheduled dose.  Hopefully to CIR 4/1   Emmalyn Hinson,PA-C Pager [phone] General Trauma Pager [phone]   09/30/2011

## 2011-09-30 NOTE — Progress Notes (Signed)
This patient has been seen and I agree with the findings and treatment plan.  [facility] O. Ruston Fedora, III, MD, FACS [phone] (pager) [phone] (direct pager) Trauma Surgeon  

## 2011-09-30 NOTE — Progress Notes (Signed)
PCA hydromorphone discontinued, wasted 2.7mg  9 ml down sink in med room. No PCA hydromorphone found on [facility] med profile for this patient.

## 2011-10-01 ENCOUNTER — Inpatient Hospital Stay (HOSPITAL_COMMUNITY)
Admission: RE | Admit: 2011-10-01 | Discharge: 2011-10-05 | DRG: 945 | Disposition: A | Discharge status: Home / Self Care | Payer: Medicaid Other | Source: Ambulatory Visit | Attending: Physical Medicine & Rehabilitation | Admitting: Physical Medicine & Rehabilitation

## 2011-10-01 DIAGNOSIS — F411 Generalized anxiety disorder: Secondary | ICD-10-CM | POA: Diagnosis present

## 2011-10-01 DIAGNOSIS — S2242XA Multiple fractures of ribs, left side, initial encounter for closed fracture: Secondary | ICD-10-CM | POA: Diagnosis present

## 2011-10-01 DIAGNOSIS — S272XXA Traumatic hemopneumothorax, initial encounter: Secondary | ICD-10-CM | POA: Diagnosis present

## 2011-10-01 DIAGNOSIS — Z5189 Encounter for other specified aftercare: Secondary | ICD-10-CM

## 2011-10-01 DIAGNOSIS — F1010 Alcohol abuse, uncomplicated: Secondary | ICD-10-CM | POA: Diagnosis present

## 2011-10-01 DIAGNOSIS — S22070A Wedge compression fracture of T9-T10 vertebra, initial encounter for closed fracture: Secondary | ICD-10-CM | POA: Diagnosis present

## 2011-10-01 DIAGNOSIS — G4700 Insomnia, unspecified: Secondary | ICD-10-CM | POA: Diagnosis present

## 2011-10-01 DIAGNOSIS — Z789 Other specified health status: Secondary | ICD-10-CM | POA: Diagnosis present

## 2011-10-01 DIAGNOSIS — S22009A Unspecified fracture of unspecified thoracic vertebra, initial encounter for closed fracture: Secondary | ICD-10-CM

## 2011-10-01 DIAGNOSIS — W1789XA Other fall from one level to another, initial encounter: Secondary | ICD-10-CM | POA: Diagnosis present

## 2011-10-01 DIAGNOSIS — D62 Acute posthemorrhagic anemia: Secondary | ICD-10-CM | POA: Diagnosis present

## 2011-10-01 DIAGNOSIS — S2220XA Unspecified fracture of sternum, initial encounter for closed fracture: Secondary | ICD-10-CM | POA: Diagnosis present

## 2011-10-01 DIAGNOSIS — E871 Hypo-osmolality and hyponatremia: Secondary | ICD-10-CM | POA: Diagnosis present

## 2011-10-01 DIAGNOSIS — K5900 Constipation, unspecified: Secondary | ICD-10-CM | POA: Diagnosis present

## 2011-10-01 DIAGNOSIS — S2249XA Multiple fractures of ribs, unspecified side, initial encounter for closed fracture: Secondary | ICD-10-CM | POA: Diagnosis present

## 2011-10-01 DIAGNOSIS — W19XXXA Unspecified fall, initial encounter: Secondary | ICD-10-CM

## 2011-10-01 DIAGNOSIS — S22060A Wedge compression fracture of T7-T8 vertebra, initial encounter for closed fracture: Secondary | ICD-10-CM | POA: Diagnosis present

## 2011-10-01 DIAGNOSIS — F17200 Nicotine dependence, unspecified, uncomplicated: Secondary | ICD-10-CM | POA: Diagnosis present

## 2011-10-01 MED ORDER — ALUM & MAG HYDROXIDE-SIMETH 200-200-20 MG/5ML PO SUSP: 30.0000 mL | ORAL | Status: DC | PRN

## 2011-10-01 MED ORDER — METHOCARBAMOL 500 MG PO TABS
1000.0000 mg | ORAL_TABLET | Freq: Four times a day (QID) | ORAL | Status: DC
  Administered 2011-10-01 – 2011-10-04 (×11): 1000 mg via ORAL
  Administered 2011-10-04: 500 mg via ORAL
  Administered 2011-10-04 – 2011-10-05 (×2): 1000 mg via ORAL
  Filled 2011-10-01 (×25): qty 2

## 2011-10-01 MED ORDER — BISACODYL 10 MG RE SUPP: 10.0000 mg | Freq: Every day | RECTAL | Status: DC | PRN

## 2011-10-01 MED ORDER — GUAIFENESIN-DM 100-10 MG/5ML PO SYRP: 5.0000 mL | ORAL_SOLUTION | Freq: Four times a day (QID) | ORAL | Status: DC | PRN

## 2011-10-01 MED ORDER — OXYCODONE HCL 5 MG PO TABS
5.0000 mg | ORAL_TABLET | ORAL | Status: DC | PRN
  Administered 2011-10-01 – 2011-10-04 (×11): 5 mg via ORAL
  Filled 2011-10-01 (×11): qty 1

## 2011-10-01 MED ORDER — ENOXAPARIN SODIUM 40 MG/0.4ML ~~LOC~~ SOLN
40.0000 mg | SUBCUTANEOUS | Status: DC
  Administered 2011-10-02 – 2011-10-04 (×3): 40 mg via SUBCUTANEOUS
  Filled 2011-10-01 (×5): qty 0.4

## 2011-10-01 MED ORDER — ACETAMINOPHEN 325 MG PO TABS: 325.0000 mg | ORAL_TABLET | ORAL | Status: DC | PRN

## 2011-10-01 MED ORDER — ADULT MULTIVITAMIN W/MINERALS CH
1.0000 | ORAL_TABLET | Freq: Every day | ORAL | Status: DC
  Administered 2011-10-02 – 2011-10-05 (×4): 1 via ORAL
  Filled 2011-10-01 (×6): qty 1

## 2011-10-01 MED ORDER — SELENIUM SULFIDE 1 % EX LOTN
TOPICAL_LOTION | Freq: Every day | CUTANEOUS | Status: DC
  Administered 2011-10-02 – 2011-10-05 (×4): via TOPICAL
  Filled 2011-10-01: qty 207

## 2011-10-01 MED ORDER — KETOROLAC TROMETHAMINE 15 MG/ML IJ SOLN
15.0000 mg | Freq: Four times a day (QID) | INTRAMUSCULAR | Status: AC
  Administered 2011-10-01 – 2011-10-02 (×4): 15 mg via INTRAVENOUS
  Filled 2011-10-01 (×4): qty 1

## 2011-10-01 MED ORDER — FOLIC ACID 1 MG PO TABS
1.0000 mg | ORAL_TABLET | Freq: Every day | ORAL | Status: DC
  Administered 2011-10-02 – 2011-10-05 (×4): 1 mg via ORAL
  Filled 2011-10-01 (×6): qty 1

## 2011-10-01 MED ORDER — POLYETHYLENE GLYCOL 3350 17 G PO PACK
17.0000 g | PACK | Freq: Every day | ORAL | Status: DC
  Administered 2011-10-02 – 2011-10-05 (×3): 17 g via ORAL
  Filled 2011-10-01 (×6): qty 1

## 2011-10-01 MED ORDER — METHADONE HCL 10 MG PO TABS
20.0000 mg | ORAL_TABLET | Freq: Three times a day (TID) | ORAL | Status: DC
  Administered 2011-10-01 – 2011-10-02 (×3): 20 mg via ORAL
  Filled 2011-10-01 (×3): qty 2

## 2011-10-01 MED ORDER — ALPRAZOLAM 0.5 MG PO TABS
1.0000 mg | ORAL_TABLET | Freq: Every evening | ORAL | Status: DC | PRN
  Administered 2011-10-01 – 2011-10-04 (×4): 1 mg via ORAL
  Filled 2011-10-01 (×4): qty 2

## 2011-10-01 MED ORDER — SPIRITUS FRUMENTI
2.0000 | Freq: Two times a day (BID) | ORAL | Status: DC
  Filled 2011-10-01 (×12): qty 2

## 2011-10-01 MED ORDER — VITAMIN B-1 100 MG PO TABS
100.0000 mg | ORAL_TABLET | Freq: Every day | ORAL | Status: DC
  Administered 2011-10-02 – 2011-10-05 (×4): 100 mg via ORAL
  Filled 2011-10-01 (×6): qty 1

## 2011-10-01 MED ORDER — ONDANSETRON HCL 4 MG PO TABS: 4.0000 mg | ORAL_TABLET | Freq: Four times a day (QID) | ORAL | Status: DC | PRN

## 2011-10-01 MED ORDER — ONDANSETRON HCL 4 MG/2ML IJ SOLN: 4.0000 mg | Freq: Four times a day (QID) | INTRAMUSCULAR | Status: DC | PRN

## 2011-10-01 MED ORDER — FLEET ENEMA 7-19 GM/118ML RE ENEM
1.0000 | ENEMA | Freq: Once | RECTAL | Status: AC | PRN
  Filled 2011-10-01: qty 1

## 2011-10-01 MED ORDER — POLYETHYLENE GLYCOL 3350 17 G PO PACK
17.0000 g | PACK | Freq: Every day | ORAL | Status: DC
  Filled 2011-10-01: qty 1

## 2011-10-01 MED ORDER — PANTOPRAZOLE SODIUM 40 MG PO TBEC
40.0000 mg | DELAYED_RELEASE_TABLET | Freq: Every day | ORAL | Status: DC
  Administered 2011-10-02 – 2011-10-04 (×3): 40 mg via ORAL
  Filled 2011-10-01 (×4): qty 1

## 2011-10-01 MED ORDER — ASPIRIN EC 81 MG PO TBEC
81.0000 mg | DELAYED_RELEASE_TABLET | Freq: Every day | ORAL | Status: DC
  Administered 2011-10-03 – 2011-10-04 (×2): 81 mg via ORAL
  Filled 2011-10-01 (×3): qty 1

## 2011-10-01 MED ORDER — DIPHENHYDRAMINE HCL 12.5 MG/5ML PO ELIX: 12.5000 mg | ORAL_SOLUTION | Freq: Four times a day (QID) | ORAL | Status: DC | PRN

## 2011-10-01 MED ORDER — DOCUSATE SODIUM 100 MG PO CAPS
100.0000 mg | ORAL_CAPSULE | Freq: Two times a day (BID) | ORAL | Status: DC
  Administered 2011-10-01: 100 mg via ORAL
  Filled 2011-10-01: qty 1

## 2011-10-01 NOTE — Progress Notes (Signed)
Physical Therapy Treatment Patient Details Name: Anthony Hoover MRN: [id number] DOB: [birth year] Today's Date: 10/01/2011  PT Assessment/Plan  PT - Assessment/Plan Comments on Treatment Session: Pt making good progress with ambulation distance and activity tolerance. Pt continues to require +2 total assist for brace in bed and requires up to moderate assist with bed mobility. Pt requires frequent cues for controlled breathing as he tends to hold his breath with pain/mobility. PT Plan: Discharge plan remains appropriate;Frequency remains appropriate PT Frequency: Min 5X/week Follow Up Recommendations: Skilled nursing facility;Home health PT;Supervision for mobility/OOB (will depend on pt's functional improvement and home support) Equipment Recommended: Defer to next venue (will be updated if pt is to go home with home health) PT Goals  Acute Rehab PT Goals Pt will Roll Supine to Right Side: with supervision PT Goal: Rolling Supine to Right Side - Progress: Progressing toward goal Pt will go Supine/Side to Sit: with supervision PT Goal: Supine/Side to Sit - Progress: Progressing toward goal Pt will go Sit to Stand: with modified independence PT Goal: Sit to Stand - Progress: Goal set today Pt will go Stand to Sit: with modified independence PT Goal: Stand to Sit - Progress: Goal set today Pt will Ambulate: 51 - 150 feet;with least restrictive assistive device;with supervision PT Goal: Ambulate - Progress: Progressing toward goal Additional Goals Additional Goal #1: Pt will independently state and demonstrate adherence to 3/3 back precautions and verbalize brace wear. PT Goal: Additional Goal #1 - Progress: Progressing toward goal Additional Goal #2: Pt will independently state and demonstrate adherence to 3/3 sternal precautions PT Goal: Additional Goal #2 - Progress: Progressing toward goal  PT Treatment Precautions/Restrictions  Precautions Precautions: Back;Sternal;Fall Precaution  Booklet Issued: Yes (comment) Precaution Comments: pt able to recall 3/3 back precautions and 2/3 sternal precautions. needed max cues adhere to sternal precautions and not place increased wt/pressure on arms with sitting at edge of bed and with gait with RW. Required Braces or Orthoses: Yes Spinal Brace: Thoracolumbosacral orthotic;Applied in supine position (total assist of 2 to don brace in bed) Restrictions Weight Bearing Restrictions: No Mobility (including Balance) Bed Mobility Rolling Right: 4: Min assist Rolling Right Details (indicate cue type and reason): cues for logroll (using bent knees to "pull shoulders with them to keep back straight). Pt continues to try to use railing to roll. Cues for breathing as pt holds breath with pain.  Rolling Left: 3: Mod assist Rolling Left Details (indicate cue type and reason): bed flat and cues for technique. Assist provided through pad.  Right Sidelying to Sit: HOB flat;3: Mod assist Right Sidelying to Sit Details (indicate cue type and reason): Verbal cues for sternal and back precautions with transfer.  Transfers Transfers: Yes Sit to Stand: From bed;Without upper extremity assist;4: Min assist (pt= 75-80%) Sit to Stand Details (indicate cue type and reason): cues for increased ant wt shift to get wt over feet to assist with standing. Cues for minimized use of UEs. Assist secondary to weakness and pain. Stand to Sit: 4: Min assist;With upper extremity assist;To chair/3-in-1;With armrests Stand to Sit Details: pt with controlled descent with minimal pressure on arms with sitting, used legs to lower slowly into chair. min assist for controlled descent Ambulation/Gait Ambulation/Gait: Yes Ambulation/Gait Assistance: 4: Min assist Ambulation/Gait Assistance Details (indicate cue type and reason): min cues for posture and walker position with gait (pt with tendency to push walker too far out). Cues for decreased reliance on RW. Pt motivated to  further ambulation today.  Ambulation  Distance (Feet): 450 Feet Assistive device: Rolling walker Gait Pattern: Step-through pattern;Decreased stride length;Trunk flexed Gait velocity: decreased intially, progressed with increased speed throughout    End of Session PT - End of Session Equipment Utilized During Treatment: Gait belt;Back brace Activity Tolerance: Patient tolerated treatment well;Patient limited by pain Patient left: in chair;with call bell in reach Nurse Communication: Mobility status for transfers;Mobility status for ambulation General Behavior During Session: [facility] for tasks performed Cognition: [facility] for tasks performed  Wilhemina Bonito 10/01/2011, 12:40 PM  Sherie Don) Carleene Mains PT, DPT Acute Rehabilitation [phone]

## 2011-10-01 NOTE — PMR Pre-admission (Signed)
PMR Admission Coordinator Pre-Admission Assessment  Patient: Anthony Hoover is an 46 y.o., male MRN: [id number] DOB: 1965/07/27 Height: 5\' 9"  (175.3 cm) Weight: 82.6 kg (182 lb 1.6 oz)  Insurance Information HMO    PPO:       PCP:       IPA:       80/20:       OTHER:   PRIMARY: Medicaid  Policy#: [id number] S Subscriber: Anthony Hoover CM Name:        Phone#:       Fax#:   Pre-Cert#:        Employer: Unemployed Benefits:  Phone #: [phone]     Name:   Eff. Date: Verified 09/27/11     Deduct   Out of Pocket Max:  Life Max:   CIR:        SNF:   Outpatient:       Co-Pay:   Home Health:        Co-Pay:   DME:       Co-Pay:   Providers:    Emergency Contact Information Contact Information    Name Relation Home Work Mobile   Janney,Olene Relative [phone]       Current Medical History  Patient Admitting Diagnosis: Polytrauma after fall from tree  History of Present Illness: Anthony Hoover is an 46 y.o. male with history of HTN, chronic LBP, who fell out of a tree 3/26 while cutting branches, landed on his back and develop back pain soon after. Admitted via [facility] and workup done revealing multiple left rib fractures, pneumothorax with associated hemothorax, sternal fracture, T8 and T10 burst fractures with disruption of vertebral bodies, nondisplaced T10 lamina fracture and nondisplaced spinous fractures T7, T9. Dr. Jeral Fruit consulted for input and recommended bracing. TLSO to be donned when supine.  Past Medical History  Past Medical History  Diagnosis Date  . Hypertension   . Back pain     pain management by Dr. Janna Arch  . CVA (cerebral infarction)     2009  . Insomnia   . Anxiety disorder    Family History  family history includes Cancer in his father and Diabetes in his mother.  Prior Rehab/Hospitalizations: No previous rehab admissions   Current Medications  Current facility-administered medications:aspirin EC tablet 81 mg, 81 mg, Oral, QHS, Freeman Caldron, PA, 81 mg  at 09/30/11 2214;  docusate sodium (COLACE) capsule 100 mg, 100 mg, Oral, BID, Shawn Rayburn, PA, 100 mg at 10/01/11 1025;  enoxaparin (LOVENOX) injection 40 mg, 40 mg, Subcutaneous, Daily, Atilano Ina, MD,FACS, 40 mg at 09/30/11 6644;  folic acid (FOLVITE) tablet 1 mg, 1 mg, Oral, Daily, Atilano Ina, MD,FACS, 1 mg at 10/01/11 1026 HYDROmorphone (DILAUDID) injection 1 mg, 1 mg, Intravenous, Q3H PRN, Shawn Rayburn, PA;  ketorolac (TORADOL) 15 MG/ML injection 15 mg, 15 mg, Intravenous, Q6H, Shawn Rayburn, PA, 15 mg at 10/01/11 0538;  methadone (DOLOPHINE) tablet 20 mg, 20 mg, Oral, Q8H, Freeman Caldron, PA, 20 mg at 10/01/11 0347;  methocarbamol (ROBAXIN) tablet 1,000 mg, 1,000 mg, Oral, QID, Shawn Rayburn, PA, 1,000 mg at 10/01/11 1025 mulitivitamin with minerals tablet 1 tablet, 1 tablet, Oral, Daily, Atilano Ina, MD,FACS, 1 tablet at 10/01/11 1026;  ondansetron (ZOFRAN) injection 4 mg, 4 mg, Intravenous, Q6H PRN, Atilano Ina, MD,FACS;  ondansetron [facility]) tablet 4 mg, 4 mg, Oral, Q6H PRN, Atilano Ina, MD,FACS;  oxyCODONE (Oxy IR/ROXICODONE) immediate release tablet 10 mg, 10 mg, Oral, Q2H PRN, Ines Bloomer  Rayburn, PA oxyCODONE (Oxy IR/ROXICODONE) immediate release tablet 10 mg, 10 mg, Oral, Q4H, Shawn Rayburn, PA, 10 mg at 10/01/11 1025;  oxyCODONE (Oxy IR/ROXICODONE) immediate release tablet 15 mg, 15 mg, Oral, Q3H PRN, Shawn Rayburn, PA, 15 mg at 09/29/11 1156;  oxyCODONE (Oxy IR/ROXICODONE) immediate release tablet 20 mg, 20 mg, Oral, Q3H PRN, Shawn Rayburn, PA, 20 mg at 09/30/11 0516 pantoprazole (PROTONIX) EC tablet 40 mg, 40 mg, Oral, Q1200, Freeman Caldron, PA, 40 mg at 10/01/11 1024;  polyethylene glycol (MIRALAX / GLYCOLAX) packet 17 g, 17 g, Oral, Daily, Shawn Rayburn, PA;  spiritus frumenti (ethyl alcohol) solution 2 each, 2 each, Oral, BID WC, Freeman Caldron, PA, 1 each at 09/27/11 1700;  thiamine (VITAMIN B-1) tablet 100 mg, 100 mg, Oral, Daily, Atilano Ina, MD,FACS, 100 mg at  10/01/11 1026  Patients Current Diet: General  Precautions / Restrictions Precautions Precautions: Back;Sternal;Fall Precaution Booklet Issued: Yes (comment) Precaution Comments: pt able to recall 3/3 back precautions and 2/3 sternal precautions. needed max cues to demo sternal precautions and not place increased wt/pressure on arms with sitting at edge of bed and with gait with RW. Required Braces or Orthoses: Yes Spinal Brace: Thoracolumbosacral orthotic;Applied in supine position (total assist of 2 to don brace in bed) Restrictions Weight Bearing Restrictions: No   Prior Activity Level Community (5-7x/wk): Went out daily  Journalist, newspaper / Equipment Home Assistive Devices/Equipment: None Home Adaptive Equipment: Straight cane  Prior Functional Level Prior Function Level of Independence: Independent with basic ADLs;Independent with homemaking with wheelchair;Independent with transfers;Independent with gait Driving: Yes Vocation: Unemployed Comments: Dgtr is in school and mother at home but mother cannot provide greater than supervision for mobility  Current Functional Level Cognition  Arousal/Alertness: Awake/alert Overall Cognitive Status: Appears within functional limits for tasks assessed Orientation Level: Oriented X4    Sensation  Light Touch: Not tested    Coordination  Gross Motor Movements are Fluid and Coordinated: Not tested Fine Motor Movements are Fluid and Coordinated: Not tested    ADLs  Eating/Feeding: Performed;Minimal assistance Eating/Feeding Details (indicate cue type and reason): Min A to bring cup with straw to mouth Where Assessed - Eating/Feeding: Chair Grooming: Simulated;Moderate assistance Grooming Details (indicate cue type and reason): set up and bringing objects to/using at face Where Assessed - Grooming: Sitting, chair Upper Body Bathing: Not assessed Lower Body Bathing: +1 Total assistance;Simulated Where Assessed - Lower Body  Bathing: Rolling right and/or left Upper Body Dressing: Maximal assistance;Performed Upper Body Dressing Details (indicate cue type and reason): gown Where Assessed - Upper Body Dressing: Sitting, bed Lower Body Dressing: Performed;+1 Total assistance Lower Body Dressing Details (indicate cue type and reason): Pt. donned shorts supine - able to pull pants over feet with min A for Lt. foot.  He was able to pull pants to his hips, but requires total A +2 to log roll Lt. and Rt. due to severe pain Lt. ribs and sternum.    Where Assessed - Lower Body Dressing: Rolling right and/or left Toilet Transfer: Simulated;+2 Total assistance (pt=70% ) Toilet Transfer Details (indicate cue type and reason): simulated EOB to chair sit to stand Toileting - Clothing Manipulation: Simulated;+1 Total assistance Toileting - Hygiene: Simulated;+1 Total assistance Where Assessed - Toileting Hygiene: Standing Tub/Shower Transfer: Not assessed Equipment Used: Rolling walker Ambulation Related to ADLs: Pt. abmulated with total A +2 with RW (~75-85%).  Co-treat with PT.    ADL Comments: Pt. movitivated, but limited by severe pain.  Pt. instructed  in deep breathing exercises.    Bed Mobility  Bed Mobility: Yes Rolling Right: 4: Min assist (pt= 75-80%) Rolling Right Details (indicate cue type and reason): cues for logroll (using bent knees to "pull shoulders with them to keep back straight). pt held pillow to sternum for support and was able to use abdominals to assist with rolling side to side to don brace Rolling Left: 4: Min assist (pt= 75-80%) Rolling Left Details (indicate cue type and reason): bed flat and cues for technique. see previous comments Right Sidelying to Sit: HOB flat;3: Mod assist (pt= 60-70%) Right Sidelying to Sit Details (indicate cue type and reason): cues to bring legs off bed and use that momentum with abodominals to assist with sitting up. most assist needed with trunk transition into sitting.  pt held pillow to chest for increased support and was able to place some wt on right elbow to assist with trunk transition. Sit to Sidelying Right: 3: Mod assist;HOB flat Sit to Sidelying Right Details (indicate cue type and reason): assist to bring legs into bed and control trunk to surface with VC throughout for breathing    Transfers  Transfers: Yes Sit to Stand: 4: Min assist;From bed;Without upper extremity assist (pt= 75-80%) Sit to Stand Details (indicate cue type and reason): pt held pillow to chest for support. cues for increased ant wt shift to get wt over feet to assist with standing.  Stand to Sit: 4: Min assist;With upper extremity assist;To chair/3-in-1;With armrests Stand to Sit Details: pt with controlled descent with minimal pressure on arms with sitting, used legs to lower slowly into chair. min assist for controlled descent. Stand Pivot Transfers: 3: Mod assist Stand Pivot Transfer Details (indicate cue type and reason): use of RW with assist to direct and turn RW.     Ambulation / Gait / Stairs / Wheelchair Mobility  Ambulation/Gait Ambulation/Gait: Yes Ambulation/Gait Assistance: 4: Min assist (progressed to min guard assist) Ambulation/Gait Assistance Details (indicate cue type and reason): min cues for posture and walker position with gait (pt with tendency to push walker too far out). Ambulation Distance (Feet): 200 Feet Assistive device: Rolling walker Gait Pattern: Step-through pattern;Decreased stride length;Trunk flexed Gait velocity: decreased intially, progressed with increased speed throughout Stairs: No Wheelchair Mobility Wheelchair Mobility: No    Posture / Balance Posture/Postural Control Posture/Postural Control: No significant limitations     Previous Home Environment Living Arrangements: Children;Spouse/significant other Lives With: Daughter;Other (Comment) Type of Home: House Home Layout: One level Home Access: Stairs to enter [employer] of Steps: 2 Bathroom Shower/Tub: Tub/shower unit;Walk-in shower;Curtain Bathroom Toilet: Standard Home Care Services: No  Discharge Living Setting Plans for Discharge Living Setting: Patient's home;House;Lives with (comment) (Lives with mom (75 yr old) and 46 yr old dtr) Type of Home at Discharge: House Discharge Home Layout: One level Discharge Home Access: Level entry Do you have any problems obtaining your medications?: No  Social/Family/Support Systems Patient Roles: Parent Contact Information: Tarick Parenteau - Mom (h) [phone] Anticipated Caregiver: Mom Anticipated Caregiver's Contact Information: Fransico Setters - mom [phone] Ability/Limitations of Caregiver: Mom can assist.  Dtr is 85 yr old and in school Caregiver Availability: 24/7 Discharge Plan Discussed with Primary Caregiver: Yes Is Caregiver In Agreement with Plan?: Yes Does Caregiver/Family have Issues with Lodging/Transportation while Pt is in Rehab?: No  Goals/Additional Needs Patient/Family Goal for Rehab: PT S, OT S/Min A goals Expected length of stay: 2 weeks Cultural Considerations: [employer] Dietary Needs: Regular diet Equipment Needs: TBD Pt/Family  Agrees to Admission and willing to participate: Yes Program Orientation Provided & Reviewed with Pt/Caregiver Including Roles  & Responsibilities: Yes  Patient Condition: Please see physician update to information in consult dated 09/27/11.  Preadmission Screen Completed By:  Trish Mage, 10/01/2011 11:05 AM ______________________________________________________________________   Discussed status with Dr. Wynn Banker on 10/01/11 at 1118 and received telephone approval for admission today.  Admission Coordinator:  Trish Mage, time1118/Date04/01/13

## 2011-10-01 NOTE — Discharge Summary (Signed)
Agree with above 

## 2011-10-01 NOTE — H&P (Signed)
Chief Complaint   Patient presents with   .  Fall     fell 20 feet out of tree with multitrauma   :  HPI: Anthony Hoover is an 46 y.o. male with history of HTN, chronic LBP, who fell out of a tree 3/26 while cutting branches, landed on his back and develop back pain soon after. Admitted via [facility] and workup done revealing multiple left rib fractures, pneumothorax with associated hemothorax, sternal fracture, T8 and T10 burst fractures with disruption of vertebral bodies, nondisplaced T10 lamina fracture and nondisplaced spinous fractures T7, T9. Dr. Jeral Fruit consulted for input and recommended bracing. TLSO to be donned when supine. Alcohol abuse history treated with CIWA protocol and beer. PCA discontinued yesterday. Continues to have issues with constipation.  Review of Systems  HENT: Negative for hearing loss and neck pain.  Eyes: Negative for blurred vision and double vision.  Respiratory: Negative for cough and shortness of breath.  Cardiovascular: Positive for chest pain (left sided rib pain).  Gastrointestinal: Positive for constipation. Negative for heartburn, nausea, vomiting and abdominal pain.  Genitourinary: Negative for urgency and frequency.  Musculoskeletal: Negative for myalgias and back pain.  Neurological: Negative for speech change, focal weakness and headaches.  Psychiatric/Behavioral: The patient is nervous/anxious and has insomnia.   Past Medical History   Diagnosis  Date   .  Hypertension    .  Back pain      pain management by Dr. Janna Arch   .  CVA (cerebral infarction)      2009   .  Insomnia    .  Anxiety disorder     Past Surgical History   Procedure  Date   .  Umbilical hernia repair     Family History   Problem  Relation  Age of Onset   .  Diabetes  Mother    .  Cancer  Father     Social History: Lives with mother and daughter. 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 He reports that he has been smoking .5 packs per day-quit in '09 but resumed few  months ago.Marland Kitchen He does not have any smokeless tobacco history on file. He reports that he drinks about 15-20 beer daily. He reports that he does not use illicit drugs. Mother can provide supervision past discharge. Sister in law is a CNA and currently unemployed/ can assist past discharge?  Allergies: No Known Allergies  Scheduled Meds:  .  aspirin EC  81 mg  Oral  QHS   .  docusate sodium  100 mg  Oral  BID   .  enoxaparin  40 mg  Subcutaneous  Daily   .  folic acid  1 mg  Oral  Daily   .  ketorolac  15 mg  Intravenous  Q6H   .  methadone  20 mg  Oral  Q8H   .  methocarbamol  1,000 mg  Oral  QID   .  mulitivitamin with minerals  1 tablet  Oral  Daily   .  oxyCODONE  10 mg  Oral  Q4H   .  pantoprazole  40 mg  Oral  Q1200   .  polyethylene glycol  17 g  Oral  Daily   .  spiritus frumenti  2 each  Oral  BID WC   .  thiamine  100 mg  Oral  Daily    Continuous Infusions:  PRN Meds:.HYDROmorphone (DILAUDID) injection, ondansetron (ZOFRAN) IV, ondansetron, oxyCODONE, oxyCODONE, oxyCODONE  No current outpatient prescriptions on file as of 10/01/2011.   Home:  Home Living  Lives With: Daughter;Other (Comment)  Type of Home: House  Home Layout: One level  Home Access: Stairs to enter  [employer] of Steps: 2  Bathroom Shower/Tub: Tub/shower unit;Walk-in shower;Curtain  Bathroom Toilet: Standard  Home Adaptive Equipment: Straight cane  Functional History:  Prior Function  Level of Independence: Independent with basic ADLs;Independent with homemaking with wheelchair;Independent with transfers;Independent with gait  Driving: Yes  Vocation: Unemployed  Comments: Dgtr is in school and mother at home but mother cannot provide greater than supervision for mobility  Functional Status:  Mobility:  Bed Mobility  Bed Mobility: Yes  Rolling Right: 4: Min assist (pt= 75-80%)  Rolling Right Details (indicate cue type and reason): cues for logroll (using bent knees to "pull shoulders with  them to keep back straight). pt held pillow to sternum for support and was able to use abdominals to assist with rolling side to side to don brace  Rolling Left: 4: Min assist (pt= 75-80%)  Rolling Left Details (indicate cue type and reason): bed flat and cues for technique. see previous comments  Right Sidelying to Sit: HOB flat;3: Mod assist (pt= 60-70%)  Right Sidelying to Sit Details (indicate cue type and reason): cues to bring legs off bed and use that momentum with abodominals to assist with sitting up. most assist needed with trunk transition into sitting. pt held pillow to chest for increased support and was able to place some wt on right elbow to assist with trunk transition.  Sit to Sidelying Right: 3: Mod assist;HOB flat  Sit to Sidelying Right Details (indicate cue type and reason): assist to bring legs into bed and control trunk to surface with VC throughout for breathing  Transfers  Transfers: Yes  Sit to Stand: 4: Min assist;From bed;Without upper extremity assist (pt= 75-80%)  Sit to Stand Details (indicate cue type and reason): pt held pillow to chest for support. cues for increased ant wt shift to get wt over feet to assist with standing.  Stand to Sit: 4: Min assist;With upper extremity assist;To chair/3-in-1;With armrests  Stand to Sit Details: pt with controlled descent with minimal pressure on arms with sitting, used legs to lower slowly into chair. min assist for controlled descent.  Stand Pivot Transfers: 3: Mod assist  Stand Pivot Transfer Details (indicate cue type and reason): use of RW with assist to direct and turn RW.  Ambulation/Gait  Ambulation/Gait: Yes  Ambulation/Gait Assistance: 4: Min assist (progressed to min guard assist)  Ambulation/Gait Assistance Details (indicate cue type and reason): min cues for posture and walker position with gait (pt with tendency to push walker too far out).  Ambulation Distance (Feet): 200 Feet  Assistive device: Rolling walker    Gait Pattern: Step-through pattern;Decreased stride length;Trunk flexed  Gait velocity: decreased intially, progressed with increased speed throughout  Stairs: No  Wheelchair Mobility  Wheelchair Mobility: No  ADL:  ADL  Eating/Feeding: Performed;Minimal assistance  Eating/Feeding Details (indicate cue type and reason): Min A to bring cup with straw to mouth  Where Assessed - Eating/Feeding: Chair  Grooming: Simulated;Moderate assistance  Grooming Details (indicate cue type and reason): set up and bringing objects to/using at face  Where Assessed - Grooming: Sitting, chair  Upper Body Bathing: Not assessed  Lower Body Bathing: +1 Total assistance;Simulated  Where Assessed - Lower Body Bathing: Rolling right and/or left  Upper Body Dressing: Maximal assistance;Performed  Upper Body Dressing Details (indicate  cue type and reason): gown  Where Assessed - Upper Body Dressing: Sitting, bed  Lower Body Dressing: Performed;+1 Total assistance  Lower Body Dressing Details (indicate cue type and reason): Pt. donned shorts supine - able to pull pants over feet with min A for Lt. foot. He was able to pull pants to his hips, but requires total A +2 to log roll Lt. and Rt. due to severe pain Lt. ribs and sternum.  Where Assessed - Lower Body Dressing: Rolling right and/or left  Toilet Transfer: Simulated;+2 Total assistance (pt=70% )  Toilet Transfer Details (indicate cue type and reason): simulated EOB to chair sit to stand  Toileting - Clothing Manipulation: Simulated;+1 Total assistance  Toileting - Hygiene: Simulated;+1 Total assistance  Where Assessed - Toileting Hygiene: Standing  Tub/Shower Transfer: Not assessed  Equipment Used: Rolling walker  Ambulation Related to ADLs: Pt. abmulated with total A +2 with RW (~75-85%). Co-treat with PT.  ADL Comments: Pt. movitivated, but limited by severe pain. Pt. instructed in deep breathing exercises.  Cognition:  Cognition  Arousal/Alertness:  Awake/alert  Orientation Level: Oriented X4  Cognition  Arousal/Alertness: Awake/alert  Overall Cognitive Status: Appears within functional limits for tasks assessed  Orientation Level: Oriented X4  Blood pressure 127/77, pulse 89, temperature 98.3 F (36.8 C), temperature source Oral, resp. rate 22, height 5\' 9"  (1.753 m), weight 82.6 kg (182 lb 1.6 oz), SpO2 91.00%.  Physical Exam  Nursing note and vitals reviewed.  Constitutional: He is oriented to person, place, and time and well-developed, well-nourished, and in no distress.  HENT:  Head: Normocephalic and atraumatic.  Eyes: Pupils are equal, round, and reactive to light.  Neck: Normal range of motion. Neck supple.  Cardiovascular: Normal rate and regular rhythm.  Pulmonary/Chest: Effort normal and breath sounds normal. There is an audible click in the left upper lung field from his rib fracture Abdominal: Soft. Bowel sounds are hyperactive. There is no tenderness.  Musculoskeletal: Normal range of motion. He exhibits no edema and no tenderness.  Left knee: He exhibits ecchymosis.  Neurological: He is alert and oriented to person, place, and time.  Motor strength is 4/5 in bilateral upper extremities at the delta was, bicep, tricep, grip  4/5 in bilateral hip flexors, knee extensors, ankle dorsiflexors and 5/5 ankle dorsiflexor plantar flexor. Sensation intact to light touch in bilateral upper and lower extremities  Post Admission Physician Evaluation:  1. Functional deficits secondary to fall on 09/25/2011 with T8 and T10 thoracic compression fractures. 2. Patient is admitted to receive collaborative, interdisciplinary care between the physiatrist, rehab nursing staff, and therapy team. 3. Patient's level of medical complexity and substantial therapy needs in context of that medical necessity cannot be provided at a lesser intensity of care such as a SNF. 4. Patient has experienced substantial functional loss from his/her baseline  which was documented above under the "Functional History" and "Functional Status" headings. Judging by the patient's diagnosis, physical exam, and functional history, the patient has potential for functional progress which will result in measurable gains while on inpatient rehab. These gains will be of substantial and practical use upon discharge in facilitating mobility and self-care at the household level. 5. Physiatrist will provide 24 hour management of medical needs as well as oversight of the therapy plan/treatment and provide guidance as appropriate regarding the interaction of the two. 6. 24 hour rehab nursing will assist with bowel management, safety, skin/wound care, disease management, medication administration, pain management and patient education and help integrate therapy concepts,  techniques,education, etc. 7. PT will assess and treat for: gait training endurance safety equipment. Goals are: Supervision level with all mobility. 8. OT will assess and treat for: ADLs, equipment, safety, endurance. Goals are: Supervision level with all ADLs. 9. SLP will assess and treat for: Not applicable. Goals are: Not applicable. 10. Case Management and Social Worker will assess and treat for psychological issues and discharge planning. 11. Team conference will be held weekly to assess progress toward goals and to determine barriers to discharge. 12. Patient will receive at least 3 hours of therapy per day at least 5 days per week. 13. ELOS and Prognosis: 7-10 days good Medical Problem List and Plan:  1. DVT Prophylaxis/Anticoagulation: Pharmaceutical: Lovenox  2. Pain Management: continues on methadone- home dose. Also using oxycodone on prn basis. Toradol D #4/5. Continue robaxin.  3. Mood: monitor for now.  4. Mild hyponatremia: ? Dilutional . Will recheck in am.  5. ABLA: Monitor H and H. Will add iron supplement.  6. Alcohol abuse: continue CIWA protocol and alcohol to prevent withdrawal  symptoms.  7. Constipation: Multifactorial. Miralax started today. Extremely anxious about having BM and does not want SSE/ suppository. advised to increase po intake and high fiber foods to help with elimination.  8. Anxiety disorder: Resume HS dose of xanax as also with complaints of insomnia.

## 2011-10-01 NOTE — Plan of Care (Signed)
Overall Plan of Care [facility]) Patient Details Name: Anthony Hoover MRN: [id number] DOB: 1965-09-29  Diagnosis:  T8-T10 compression fx's  Primary Diagnosis:    Wedge compression fracture of T8 vertebra Co-morbidities: Chronic pain  Functional Problem List  Patient demonstrates impairments in the following areas: Balance, Endurance and Pain  Basic ADL's: grooming, bathing, dressing and toileting Advanced ADL's: simple meal preparation  Transfers:  bed mobility, bed to chair, toilet, tub/shower, car and furniture Locomotion:  ambulation and stairs  Additional Impairments:  Other  Anticipated Outcomes Item Anticipated Outcome  Eating/Swallowing    Basic self-care  Set-up assist with ADLs secondary to TLSO orders, Mod I transfers  Tolieting  Mod I  Bowel/Bladder  Continent of bowel and bladder  Transfers  Mod I overall  Locomotion  Mod I overall  Communication    Cognition    Pain  <4  Safety/Judgment    Other     Therapy Plan: PT Frequency: 1-2 X/day, 60-90 minutes OT Frequency: 1-2 X/day, 60-90 minutes     Team Interventions: Item RN PT OT SLP SW TR Other  Self Care/Advanced ADL Retraining  x X      Neuromuscular Re-Education  x       Therapeutic Activities  x X      UE/LE Strength Training/ROM  x X      UE/LE Coordination Activities  x X      Visual/Perceptual Remediation/Compensation         DME/Adaptive Equipment Instruction  x X      Therapeutic Exercise  x X      Balance/Vestibular Training  x X      Patient/Family Education  x X      Cognitive Remediation/Compensation         Functional Mobility Training  x X      Ambulation/Gait Training  x X      Stair Training  x       Wheelchair Propulsion/Positioning  x X      Health and safety inspector Reintegration  x X      Dysphagia/Aspiration Film/video editor         Bladder Management x        Bowel Management x        Disease  Management/Prevention         Pain Management x x X      Medication Management         Skin Care/Wound Management   X      Splinting/Orthotics  x X      Discharge Planning x x X  x    Psychosocial Support x x X  x                       Team Discharge Planning: Destination:  Home Projected Follow-up:  PT and Home Health; OT: TBD Projected Equipment Needs:  Walker, [facility] & bench vs seat for shower Patient/family involved in discharge planning:  Yes  MD ELOS: 6 days Medical Rehab Prognosis:  Excellent Assessment: Pt is admitted for CIR therapies with focus on basic mobility, self-care, adaptive equipment and techniques given his need for the TLSO and with his back precautions.  Goals are modified indpendent to set up.  His acute on chronic pain issues have been challenging at times, and pain often limits his activity tolerance.

## 2011-10-01 NOTE — Progress Notes (Signed)
Pt transferred to Rehab. Orientated to unit, and rehab expectation. Video viewed, education material provided.

## 2011-10-01 NOTE — Progress Notes (Signed)
Patient ID: Anthony Hoover, male   DOB: [birth year], 46 y.o.   MRN: [id number]   LOS: 7 days   Subjective: Pt making progress with ambulation, but still having difficulty with don/doffing TLSO and with transfers. He still has not had a BM  Objective: Vital signs in last 24 hours: Temp:  [97.8 F (36.6 C)-98.9 F (37.2 C)] 98.3 F (36.8 C) (04/01 0607) Pulse Rate:  [77-99] 78  (04/01 0607) Resp:  [18-24] 24  (04/01 0607) BP: (117-149)/(80-94) 147/94 mmHg (04/01 0607) SpO2:  [90 %-97 %] 94 % (04/01 0607) Last BM Date: 09/24/11    Lab Results:  CBC No results found for this basename: WBC:2,HGB:2,HCT:2,PLT:2 in the last 72 hours BMET No results found for this basename: NA:2,K:2,CL:2,CO2:2,GLUCOSE:2,BUN:2,CREATININE:2,CALCIUM:2 in the last 72 hours  General appearance: alert and no distress Resp: clear to auscultation bilaterally Cardio: RRR GI: +BS, soft Pulses: 2+ and symmetric  Assessment/Plan: Fall Multiple left rib fxs -- Pain control and pulmonary toilet Right pulmonary contusions -- Mild T8,10 compression fxs -- Mobilizing in brace per Dr. Jeral Hoover who was consulted by Dr. August Hoover T8-12 TVP fxs Sternal fx- Pain control and mobilization ABL anemia -- Moderate. Stable Chronic back pain -- Treated by Dr. Cecelia Hoover in [place]. Will contact to see if he will take over pain management once he's discharged. S/p CVA EtOH use -- CIWA and beer. Tobacco use HTN -- Home meds FEN -- Tolerating regular diet better VTE -- Lovenox Dispo -- Hopefully to CIR today  Meds for constipation  Anthony Pettengill,PA-C Pager [phone] General Trauma Pager [phone]   10/01/2011

## 2011-10-01 NOTE — Discharge Summary (Signed)
Physician Discharge Summary  Patient ID: Anthony Hoover MRN: [id number] DOB/AGE: 47-Dec-1967 47 y.o.  Admit date: 09/24/2011 Discharge date: 10/01/2011  Admission Diagnoses:Fall from tree with multiple trauma  Discharge Diagnoses:  Active Problems:  Fall from tree  Multiple fractures of ribs of left side  Right pulmonary contusion  Hypertension  Chronic back pain  Wedge compression fracture of T8 vertebra  Wedge compression fracture of T10 vertebra  Sternal fracture  Fracture of transverse process of thoracic vertebra  Heavy alcohol use  Acute blood loss anemia   Discharged Condition: good  Hospital Course: Chief Complaint: fall, level 2 HPI: 45yo WM s/p 20 feet fall from Tree earlier this evening. He was in a tree cutting limbs and fell. He denies LOC. He complains of back pain and pain breathing. He was taken to [facility] where he was evaluated with a cxr and multiple CTs. He was transferred here for definitive care. He denies extremity pain. He denies any numbness/tingling in extremities. He drinks 15-20 beers/day.    Work up in the ED revealed multiple left rib fractures and fractures of T8 and T10 . The patient had no neurologic deficit. A TLSO brace was obtained for the patient and he began to mobilize with PT/OT. He has a history of chronic back pain pre-morbidly, and this has made his progress somewhat difficult, but he is making steady progress with therapy. It is felt that he would benefit from inpatient rehabilitation at this time and he is being admitted to rehab today.   Dr. Wilhemina Bonito follows the patient for his chronic pain and will resume his care post discharge from rehabilitation Consults: rehabilitation medicine and Neurosurgery- Dr. Jeral Fruit  Significant Diagnostic Studies: radiology: CT scan: CT CHEST  Findings: Upper limits normal heart size identified. There is no evidence of mediastinal hematoma or pericardial effusion.  Fractures of the left 3rd-8th and 12th  ribs are noted with tiny adjacent hemothorax and minuscule medial-anterior-basilar left pneumothorax. Minimal associated subcutaneous emphysema is noted. Bibasilar atelectasis is identified. Mild patchy airspace opacities within the right upper and lower lobes may represent contusions.  50% compression fractures of T8 and T10 are identified without bony retropulsion. Fractures of the left transverse processes of T8-T12 are noted. A nondisplaced sternal fracture is identified.  There is no evidence of enlarged lymph nodes, left pleural effusion or pericardial effusion. Moderate gynecomastia bilaterally is identified.  IMPRESSION: Fractures of the left 3rd through 8th and 12th ribs with tiny left pneumothorax and tiny hemothorax. Associated basilar atelectasis.  50% T8 and T10 compression fractures without bony retropulsion. Associated left transverse fractures of T8-T12.  Nondisplaced sternal fracture.  Mild right lung airspace opacities - question contusions.  CT ABDOMEN AND PELVIS  Findings: The liver, spleen, pancreas, kidneys, adrenal glands and gallbladder are unremarkable.  The bowel, bladder and appendix are unremarkable. No free fluid, enlarged lymph nodes, biliary dilation or abdominal aortic aneurysm identified. No acute bony abnormalities within the abdomen or pelvis are noted.  IMPRESSION: No evidence of acute abnormality within the abdomen or pelvis.  Original Report Authenticated By: Andreas Newport, M.D.   Treatments: therapies: PT and OT  Discharge Exam: Blood pressure 127/77, pulse 89, temperature 98.3 F (36.8 C), temperature source Oral, resp. rate 22, height 5\' 9"  (1.753 m), weight 82.6 kg (182 lb 1.6 oz), SpO2 91.00%. General appearance: alert, cooperative, appears stated age and no distress Resp: clear to auscultation bilaterally Cardio: regular rate and rhythm GI: soft, non-tender; bowel sounds normal; no masses,  no organomegaly  Disposition:  Discharged to inpatient rehabilitation at [facility]   Medication List  As of 10/01/2011 11:04 AM   TAKE these medications         ALPRAZolam 1 MG tablet   Commonly known as: XANAX   Take 1 mg by mouth at bedtime as needed.      aspirin EC 81 MG tablet   Take 81 mg by mouth at bedtime.      lansoprazole 30 MG capsule   Commonly known as: PREVACID   Take 30 mg by mouth daily as needed. For acid reflux      methadone 10 MG tablet   Commonly known as: DOLOPHINE   Take 20 mg by mouth 3 (three) times daily.      VIAGRA PO   Take 0.5-1 tablets by mouth as needed. For intercourse           Follow-up Information    Schedule an appointment as soon as possible for a visit with Karn Cassis, MD. (after discharge from rehabilitation)    Contact information:   [address] [place] [address] [phone]       Follow up with TRAUMA MD, MD. (As needed)    Contact information:   [phone]         Signed: Franki Monte Pager [phone] General Trauma Pager [phone]

## 2011-10-01 NOTE — Progress Notes (Signed)
Rehab admissions - I spoke with patient and he would like inpatient rehab prior to home with his mom and dtr.  Bed available and will plan to admit to inpatient rehab today.  Pager [phone]

## 2011-10-01 NOTE — Progress Notes (Signed)
Mobilize and pain control. 

## 2011-10-01 NOTE — Progress Notes (Signed)
OT Cancellation Note  Treatment cancelled today due to pt eating lunch first attempt; second attempt, pt. had transferred to CIR.  Jeani Hawking M 10/01/2011, 4:51 PM

## 2011-10-02 DIAGNOSIS — Z5189 Encounter for other specified aftercare: Secondary | ICD-10-CM

## 2011-10-02 DIAGNOSIS — W1789XA Other fall from one level to another, initial encounter: Secondary | ICD-10-CM

## 2011-10-02 DIAGNOSIS — S22009A Unspecified fracture of unspecified thoracic vertebra, initial encounter for closed fracture: Secondary | ICD-10-CM

## 2011-10-02 LAB — DIFFERENTIAL
Eosinophils Absolute: 0.5 10*3/uL (ref 0.0–0.7)
Eosinophils Relative: 5 % (ref 0–5)
Lymphocytes Relative: 17 % (ref 12–46)
Lymphs Abs: 1.6 10*3/uL (ref 0.7–4.0)
Monocytes Absolute: 1.1 10*3/uL — ABNORMAL HIGH (ref 0.1–1.0)

## 2011-10-02 LAB — COMPREHENSIVE METABOLIC PANEL
ALT: 32 U/L (ref 0–53)
AST: 26 U/L (ref 0–37)
Alkaline Phosphatase: 222 U/L — ABNORMAL HIGH (ref 39–117)
CO2: 27 mEq/L (ref 19–32)
Calcium: 9.3 mg/dL (ref 8.4–10.5)
Chloride: 99 mEq/L (ref 96–112)
Total Protein: 6.7 g/dL (ref 6.0–8.3)

## 2011-10-02 LAB — CBC
HCT: 27 % — ABNORMAL LOW (ref 39.0–52.0)
Hemoglobin: 9 g/dL — ABNORMAL LOW (ref 13.0–17.0)
MCHC: 33.3 g/dL (ref 30.0–36.0)
RBC: 2.72 MIL/uL — ABNORMAL LOW (ref 4.22–5.81)
WBC: 9.9 10*3/uL (ref 4.0–10.5)

## 2011-10-02 MED ORDER — SENNOSIDES-DOCUSATE SODIUM 8.6-50 MG PO TABS
2.0000 | ORAL_TABLET | Freq: Every day | ORAL | Status: DC
  Administered 2011-10-02: 2 via ORAL
  Filled 2011-10-02 (×2): qty 2

## 2011-10-02 MED ORDER — METHADONE HCL 10 MG PO TABS
25.0000 mg | ORAL_TABLET | Freq: Three times a day (TID) | ORAL | Status: DC
  Administered 2011-10-02 – 2011-10-04 (×6): 25 mg via ORAL
  Filled 2011-10-02 (×6): qty 3

## 2011-10-02 MED ORDER — POTASSIUM CHLORIDE CRYS ER 20 MEQ PO TBCR
20.0000 meq | EXTENDED_RELEASE_TABLET | Freq: Every day | ORAL | Status: DC
  Administered 2011-10-02 – 2011-10-05 (×4): 20 meq via ORAL
  Filled 2011-10-02 (×5): qty 1

## 2011-10-02 NOTE — Plan of Care (Signed)
Problem: RH SAFETY Goal: RH STG ADHERE TO SAFETY PRECAUTIONS W/ASSISTANCE/DEVICE STG Adhere to Safety Precautions With Assistance/Device. Modified Independent  Outcome: Progressing Call appropiate

## 2011-10-02 NOTE — Patient Care Conference (Signed)
Inpatient RehabilitationTeam Conference Note Date: 10/02/2011   Time: 2:25 PM    Patient Name: Anthony Hoover      Medical Record Number: [id number]  Date of Birth: January 03, 1966 Sex: Male         Room/Bed: 4008/4008-01 Payor Info: Payor: MEDICAID [place]  Plan: MEDICAID [place] ACCESS  Product Type: *No Product type*     Admitting Diagnosis: Polytrauma  Admit Date/Time:  10/01/2011  2:33 PM Admission Comments: No comment available   Primary Diagnosis:  Wedge compression fracture of T8 vertebra Principal Problem: Wedge compression fracture of T8 vertebra  Patient Active Problem List  Diagnoses Date Noted  . Back pain   . CVA (cerebral infarction)   . Anxiety disorder   . Fall from tree 09/25/2011  . Multiple fractures of ribs of left side 09/25/2011  . Right pulmonary contusion 09/25/2011  . Hypertension 09/25/2011  . Chronic back pain 09/25/2011  . Wedge compression fracture of T8 vertebra 09/25/2011  . Wedge compression fracture of T10 vertebra 09/25/2011  . Sternal fracture 09/25/2011  . Fracture of transverse process of thoracic vertebra 09/25/2011  . Heavy alcohol use 09/25/2011  . Acute blood loss anemia 09/25/2011  . History of CVA (cerebrovascular accident) 09/25/2011  . Tobacco use 09/25/2011    Expected Discharge Date: Expected Discharge Date: 10/06/11  Team Members Present: Physician: Dr. Faith Rogue Case Manager Present: Melanee Spry, RN Social Worker Present: Amada Jupiter, LCSW Nurse Present: Daryll Brod, RN PT Present: Karolee Stamps, Judith Blonder, PTA OT Present: Edwin Cap, OT Other (Discipline and Name): Tora Duck, PPS Coordinator     Current Status/Progress Goal Weekly Team Focus  Medical   pain issues predominate. etoh abuse hx. wearing TLSO  pain mgt enough to allow hi      Bowel/Bladder   Continent of bowel and bladder. Last bowel movement 09/24/11  Contient of bowel and bladder  Bowel aids to assist with regular bm   Swallow/Nutrition/  Hydration             ADL's   Min assist for UB ADLs, Total assist for LB ADLs  Set-up for ADLs, Mod I for mobility and transfers  ADL retraining using AE prn, pain management, activity tolerance/endurance   Mobility   min A overall  mod I overall  dynamic balance and gait, functional mobility with precautions   Communication             Safety/Cognition/ Behavioral Observations            Pain   Scheduled Methadone 20mg  q 8hrs.   <3  Offer prn pain medication 1hrs prior to therapy   Skin                *See Interdisciplinary Assessment and Plan and progress notes for long and short-term goals  Barriers to Discharge: pain mgt, brace wear    Possible Resolutions to Barriers:  analgesia, adaptive techniques    Discharge Planning/Teaching Needs:  Home with mother and his 58 yo daughter - mother available to provide 24/7 assistance      Team Discussion: Pain management.  Goals Modified Independent, except brace-need mother in for education.   Revisions to Treatment Plan: none    Continued Need for Acute Rehabilitation Level of Care: The patient requires daily medical management by a physician with specialized training in physical medicine and rehabilitation for the following conditions: Daily direction of a multidisciplinary physical rehabilitation program to ensure safe treatment while eliciting the highest outcome that is  of practical value to the patient.: Yes Daily medical management of patient stability for increased activity during participation in an intensive rehabilitation regime.: Yes Daily analysis of laboratory values and/or radiology reports with any subsequent need for medication adjustment of medical intervention for : Other  [name] 10/02/2011, 5:55 PM

## 2011-10-02 NOTE — Evaluation (Signed)
Occupational Therapy Assessment and Plan & Session Note  Patient Details  Name: Anthony Hoover MRN: [id number] Date of Birth: [birth year]  OT Diagnosis: acute pain and muscle weakness (generalized) Rehab Potential: Rehab Potential: Good ELOS: 7-10 days   Today's Date: 10/02/2011  Problem List:  Patient Active Problem List  Diagnoses  . Fall from tree  . Multiple fractures of ribs of left side  . Right pulmonary contusion  . Hypertension  . Chronic back pain  . Wedge compression fracture of T8 vertebra  . Wedge compression fracture of T10 vertebra  . Sternal fracture  . Fracture of transverse process of thoracic vertebra  . Heavy alcohol use  . Acute blood loss anemia  . History of CVA (cerebrovascular accident)  . Tobacco use  . Back pain  . CVA (cerebral infarction)  . Anxiety disorder    Past Medical History:  Past Medical History  Diagnosis Date  . Hypertension   . Back pain     pain management by Dr. Janna Arch  . CVA (cerebral infarction)     2009  . Insomnia   . Anxiety disorder    Past Surgical History:  Past Surgical History  Procedure Date  . Umbilical hernia repair     Assessment & Plan Clinical Impression: Anthony Hoover is an 46 y.o. male with history of HTN, chronic LBP, who fell out of a tree 3/26 while cutting branches, landed on his back and develop back pain soon after. Admitted via [facility] and workup done revealing multiple left rib fractures, pneumothorax with associated hemothorax, sternal fracture, T8 and T10 burst fractures with disruption of vertebral bodies, nondisplaced T10 lamina fracture and nondisplaced spinous fractures T7, T9. Dr. Jeral Fruit consulted for input and recommended bracing. TLSO to be donned when supine. Alcohol abuse history treated with CIWA protocol and beer. Patient transferred to CIR on 10/01/2011 .    Patient currently requires min assist with UB ADLs & stand pivot transfers and total assist with LB ADLs secondary to muscle  weakness.  Prior to hospitalization, patient could complete ADLs and IADLs independently. During evaluation patient required max verbal cues to verbalize back precautions and sternal precautions.   Patient will benefit from skilled intervention to increase independence with basic self-care skills prior to discharge home with mother and daughter.  Anticipate patient will require intermittent supervision and additional occupational therapy is to be determined.  Goals set at an overall supervision level for basic ADLs secondary to TLSO orders (to be donned supine in bed >/or equal too 30*). Transfer goals set at mod I level. Anticipate patient will need assistance to donn/doff TLSO in supine position secondary to pain and back & sternal precautions.   OT - End of Session Activity Tolerance: Tolerates 30+ min activity with multiple rests OT Assessment Rehab Potential: Good Barriers to Discharge: None (none known at this time) OT Plan OT Frequency: 1-2 X/day, 60-90 minutes Estimated Length of Stay: 7-10 days OT Treatment/Interventions: Balance/vestibular training;Community reintegration;Discharge planning;DME/adaptive equipment instruction;Functional mobility training;Neuromuscular re-education;Pain management;Patient/family education;Self Care/advanced ADL retraining;Psychosocial support;Skin care/wound managment;Therapeutic Activities;Therapeutic Exercise;UE/LE Strength taining/ROM;UE/LE Coordination activities;Wheelchair propulsion/positioning OT Recommendation Follow Up Recommendations: Other (comment) (OT: TBD) Equipment Recommended: Rolling walker with 5" wheels  Precautions/Restrictions  Precautions Precautions: Back;Sternal;Fall Precaution Comments: Patient required max verbal cues to recall back and sternal precautions Required Braces or Orthoses: Yes Spinal Brace: Thoracolumbosacral orthotic;Applied in supine position Restrictions Weight Bearing Restrictions: No  General Chart  Reviewed: Yes  Pain Pain Assessment Pain Assessment: No/denies pain Pain Score: 0-No  pain Faces Pain Scale: No hurt  Home Living/Prior Functioning Home Living Lives With: Family Type of Home: House Home Layout: One level Home Access: Other (comment) Entrance Stairs-Number of Steps: threshold Bathroom Shower/Tub: Tub/shower unit;Walk-in shower;Curtain;Door (walk-in with door) Bathroom Toilet: Standard Bathroom Accessibility: Yes How Accessible: Accessible via walker Home Adaptive Equipment: Straight cane IADL History Homemaking Responsibilities: No Current License: Yes Mode of Transportation: Car Occupation: Unemployed 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  IADL Comments: Patient states he enjoys cooking, grilling Prior Function Level of Independence: Independent with basic ADLs;Independent with homemaking with ambulation;Independent with gait;Independent with transfers Able to Take Stairs?: Yes Driving: Yes Vocation: Unemployed Comments: Daughter attends school during the day and mother can provide min-supervision assistance  ADL - See FIM  Vision/Perception  Vision - History Baseline Vision: No visual deficits Patient Visual Report: No change from baseline Vision - Assessment Eye Alignment: Within Functional Limits Perception Perception: Within Functional Limits Praxis Praxis: Intact   Cognition Overall Cognitive Status: Appears within functional limits for tasks assessed Arousal/Alertness: Awake/alert Orientation Level: Oriented X4;Oriented to person;Oriented to place;Oriented to time;Oriented to situation Memory: Appears intact (patient reports decreased memory after stroke) Awareness: Appears intact Problem Solving: Appears intact Safety/Judgment: Appears intact  Sensation Sensation Light Touch: Appears Intact (lower extremities) Proprioception: Appears Intact Coordination Gross Motor Movements are Fluid and Coordinated: Yes Fine Motor  Movements are Fluid and Coordinated: Yes  Motor  Motor Motor: Within Functional Limits (generalized weakness)  Trunk/Postural Assessment  Cervical Assessment Cervical Assessment: Within Functional Limits Thoracic Assessment Thoracic Assessment: Exceptions to [facility] (back precautions, TLSO brace) Lumbar Assessment Lumbar Assessment: Exceptions to [facility] (back precautions; TLSO) Postural Control Postural Control: Within Functional Limits   Balance Balance Balance Assessed: Yes Static Sitting Balance Static Sitting - Level of Assistance: 6: Modified independent (Device/Increase time) Dynamic Sitting Balance Dynamic Sitting - Level of Assistance: 6: Modified independent (Device/Increase time) Static Standing Balance Static Standing - Level of Assistance: 4: Min assist Dynamic Standing Balance Dynamic Standing - Level of Assistance: 4: Min assist  Extremity/Trunk Assessment RUE Assessment RUE Assessment: Within Functional Limits LUE Assessment LUE Assessment: Within Functional Limits  See FIM for current functional status  Refer to Care Plan for Long Term Goals  Recommendations for other services: None  Discharge Criteria: Patient will be discharged from OT if patient refuses treatment 3 consecutive times without medical reason, if treatment goals not met, if there is a change in medical status, if patient makes no progress towards goals or if patient is discharged from hospital.  The above assessment, treatment plan, treatment alternatives and goals were discussed and mutually agreed upon: by patient  SESSION NOTE 0735-0830 - 55 Minutes Individual Therapy No complaints of pain initially Initial 1:1 OT evaluation completed. Focused skilled intervention on bed mobility (rolling left & right), UB bathing & dressing in supine position secondary to TLSO orders, donning TLSO, LB bathing & dressing in sit-> stand position edge of bed, stand pivot transfer into w/c, dynamic standing  balance/endurance, overall activity tolerance/endurance, and pain management.   Janelie Goltz 10/02/2011, 10:33 AM

## 2011-10-02 NOTE — Evaluation (Signed)
Recreational Therapy Assessment and Plan  Patient Details  Name: MUHSIN DORIS MRN: [id number] Date of Birth: [birth year] Today's Date: 10/02/2011   Assessment Clinical Impression: Met with pt and discussed leisure interests.  Pt stated no interest in TR services and has no plans to participate in activities other than those that are necessary at home at discharge.  No formal treatment plan implemented.  Will continue to monitor. Leisure History/Participation Awareness of Community Resources: Good-identify 3 post discharge leisure resources  Plan Rec Therapy Plan Is patient appropriate for Therapeutic Recreation?: No  The above assessment, treatment plan, treatment alternatives and goals were discussed and mutually agreed upon: by patient  Kenae Lindquist 10/02/2011, 4:11 PM

## 2011-10-02 NOTE — Progress Notes (Signed)
Patient ID: Anthony Hoover, male   DOB: 1966-04-10, 46 y.o.   MRN: [id number] Subjective/Complaints: 4/2- still having a lot of back pain. (5/10)  Had BM x 2. Other ROS negative  Objective: Vital Signs: Blood pressure 155/97, pulse 94, temperature 98.1 F (36.7 C), temperature source Oral, resp. rate 20, height 5' 9.5" (1.765 m), weight 81.1 kg (178 lb 12.7 oz), SpO2 94.00%. No results found.  Basename 10/02/11 0645  WBC 9.9  HGB 9.0*  HCT 27.0*  PLT 332    Basename 10/02/11 0645  NA 137  K 3.3*  CL 99  CO2 27  GLUCOSE 110*  BUN 16  CREATININE 0.80  CALCIUM 9.3   CBG (last 3)  No results found for this basename: GLUCAP:3 in the last 72 hours  Wt Readings from Last 3 Encounters:  10/01/11 81.1 kg (178 lb 12.7 oz)  09/25/11 82.6 kg (182 lb 1.6 oz)    Physical Exam:  General appearance: alert, cooperative, appears stated age and no distress Head: Normocephalic, without obvious abnormality, atraumatic Eyes: conjunctivae/corneas clear. PERRL, EOM's intact. Fundi benign. Ears: normal TM's and external ear canals both ears Nose: Nares normal. Septum midline. Mucosa normal. No drainage or sinus tenderness. Throat: lips, mucosa, and tongue normal; teeth and gums normal Neck: no adenopathy, no carotid bruit, no JVD, supple, symmetrical, trachea midline and thyroid not enlarged, symmetric, no tenderness/mass/nodules Back: symmetric, no curvature. ROM normal. No CVA tenderness. Resp: clear to auscultation bilaterally Cardio: regular rate and rhythm, S1, S2 normal, no murmur, click, rub or gallop GI: soft, non-tender; bowel sounds normal; no masses,  no organomegaly Extremities: extremities normal, atraumatic, no cyanosis or edema Pulses: 2+ and symmetric Skin: Skin color, texture, turgor normal. No rashes or lesions Neurologic: Grossly normal. Pain inhibition weakness. No sensory changes. CN exam normal. Cognition is intact. DTR's 1+ Incision/Wound: bruises, abrasions  stable   Assessment/Plan: 1. Functional deficits secondary to T8 and T10 compression fx's which require 3+ hours per day of interdisciplinary therapy in a comprehensive inpatient rehab setting. Physiatrist is providing close team supervision and 24 hour management of active medical problems listed below. Physiatrist and rehab team continue to assess barriers to discharge/monitor patient progress toward functional and medical goals. FIM: FIM - Bathing Bathing Steps Patient Completed: Chest;Right Arm;Abdomen;Right upper leg;Left upper leg;Left Arm Bathing: 3: Mod-Patient completes 5-7 65f 10 parts or 50-74%  FIM - Upper Body Dressing/Undressing Upper body dressing/undressing steps patient completed: Thread/unthread right sleeve of pullover shirt/dresss;Thread/unthread left sleeve of pullover shirt/dress;Put head through opening of pull over shirt/dress;Pull shirt over trunk Upper body dressing/undressing: 5: Set-up assist to: Obtain clothing/put away FIM - Lower Body Dressing/Undressing Lower body dressing/undressing: 1: Total-Patient completed less than 25% of tasks  FIM - Toileting Toileting: 0: Activity did not occur  FIM - Archivist Transfers: 0-Activity did not occur  FIM - Banker Devices: Bed rails Bed/Chair Transfer: 5: Supine > Sit: Supervision (verbal cues/safety issues);4: Bed > Chair or W/C: Min A (steadying Pt. > 75%)     Comprehension Comprehension Mode: Auditory Comprehension: 6-Follows complex conversation/direction: With extra time/assistive device  Expression Expression Mode: Verbal Expression: 6-Expresses complex ideas: With extra time/assistive device  Social Interaction Social Interaction: 6-Interacts appropriately with others with medication or extra time (anti-anxiety, antidepressant).  Problem Solving Problem Solving: 6-Solves complex problems: With extra time  Memory Memory: 6-More than reasonable  amt of time . DVT Prophylaxis/Anticoagulation: Pharmaceutical: Lovenox  2. Pain Management: continues on methadone- increase to 25mg   tid (home dose 20mg ). Also using oxycodone on prn basis.Continue robaxin.   3. Mood: monitor for now.  4. Mild hyponatremia: improved today, but now hypokalemic-supplement 5. ABLA: Monitor H and H. iron supplement. Recheck later in the week. 6. Alcohol abuse: continue CIWA protocol and alcohol to prevent withdrawal symptoms.  7. Constipation: Multifactorial. Miralax started today. Extremely anxious about having BM and does not want SSE/ suppository. advised to increase po intake and high fiber foods to help with elimination.  8. Anxiety disorder: Resume HS dose of xanax as also with complaints of insomnia   LOS (Days) 1 A FACE TO FACE EVALUATION WAS PERFORMED  [name] T 10/02/2011, 9:41 AM

## 2011-10-02 NOTE — Progress Notes (Signed)
Occupational Therapy Session Note  Patient Details  Name: Anthony Hoover MRN: [id number] Date of Birth: 04-26-1966  Today's Date: 10/02/2011 Time: 1400-1430 Time Calculation (min): 30 min  Short Term Goals: Week 1:  OT Short Term Goal 1 (Week 1): Patient will complete toilet transfer with supervision using AD prn OT Short Term Goal 2 (Week 1): Patient will complete LB dressing with minimal assistance using AE prn OT Short Term Goal 3 (Week 1): Patient will attempt tub/shower transfer with minimal assistance using DME prn OT Short Term Goal 4 (Week 1): Patient will maintain dynamic standing balance with supervision during ADL  Skilled Therapeutic Interventions/Progress Updates:    Pt in bed upon arrival.  Pt agreeable to participating in therapy even though he had just gotten comfortable.  Pt independent with directing therapist in donning TLSO.  Pt amb to ADL apartment with r/w to practice walk-in shower transfer using r/w and bed transfers.  Pt following back and sternal precautions independently.  Pt states that he will have someone at home to be with him when taking shower.  Focus on home mgmt tasks, activity tolerance, and directing care.  Therapy Documentation Precautions:  Precautions Precautions: Back;Sternal;Fall Precaution Comments: Patient required max verbal cues to recall back and sternal precautions Required Braces or Orthoses: Yes Spinal Brace: Thoracolumbosacral orthotic;Applied in supine position Restrictions Weight Bearing Restrictions: No  Pain: Pain Assessment Pain Assessment: 0-10 Pain Score:   5 Pain Type: Acute pain Pain Location: Rib cage Pain Orientation: Left;Lateral Pain Descriptors: Aching Pain Onset: With Activity Patients Stated Pain Goal: 2 Pain Intervention(s): RN made aware  See FIM for current functional status  Therapy/Group: Individual Therapy  Rich Brave 10/02/2011, 2:39 PM

## 2011-10-02 NOTE — Evaluation (Signed)
Physical Therapy Assessment and Plan  Patient Details  Name: Anthony Hoover MRN: [id number] Date of Birth: 1965-08-19  PT Diagnosis: Low back pain, Muscle weakness and Pain in ribs Rehab Potential: Good ELOS: 7 days   Today's Date: 10/02/2011 Time: 0930-1030 Time Calculation (min): 60 min  Problem List:  Patient Active Problem List  Diagnoses  . Fall from tree  . Multiple fractures of ribs of left side  . Right pulmonary contusion  . Hypertension  . Chronic back pain  . Wedge compression fracture of T8 vertebra  . Wedge compression fracture of T10 vertebra  . Sternal fracture  . Fracture of transverse process of thoracic vertebra  . Heavy alcohol use  . Acute blood loss anemia  . History of CVA (cerebrovascular accident)  . Tobacco use  . Back pain  . CVA (cerebral infarction)  . Anxiety disorder    Past Medical History:  Past Medical History  Diagnosis Date  . Hypertension   . Back pain     pain management by Dr. Janna Arch  . CVA (cerebral infarction)     2009  . Insomnia   . Anxiety disorder    Past Surgical History:  Past Surgical History  Procedure Date  . Umbilical hernia repair     Assessment & Plan Clinical Impression: Patient is a 46 y.o. year old male with recent admission to the hospital with history of HTN, chronic LBP, who fell out of a tree 3/26 while cutting branches, landed on his back and develop back pain soon after. Admitted via [facility] and workup done revealing multiple left rib fractures, pneumothorax with associated hemothorax, sternal fracture, T8 and T10 burst fractures with disruption of vertebral bodies, nondisplaced T10 lamina fracture and nondisplaced spinous fractures T7, T9. Dr. Jeral Fruit consulted for input and recommended bracing. TLSO to be donned when supine. Alcohol abuse history treated with CIWA protocol and beer. Patient transferred to CIR on 10/01/2011 .   Patient currently requires min with mobility secondary to muscle weakness.  Prior  to hospitalization, patient was independent with mobility and lived with Family in a 1 story  House. Daughter and mother live with him and can provide supervision at d/c.  Home access is thresholdOther (comment).  Patient will benefit from skilled PT intervention to maximize safe functional mobility, minimize fall risk and decrease caregiver burden for planned discharge home with 24 hour supervision.  Anticipate patient will benefit from follow up HH at discharge.  PT Assessment Rehab Potential: Good Barriers to Discharge: None PT Plan PT Frequency: 1-2 X/day, 60-90 minutes Estimated Length of Stay: 7 days PT Treatment/Interventions: Ambulation/gait training;Balance/vestibular training;Community reintegration;Discharge planning;DME/adaptive equipment instruction;Functional mobility training;Neuromuscular re-education;Pain management;Patient/family education;Psychosocial support;Splinting/orthotics;Stair training;Therapeutic Activities;Therapeutic Exercise;UE/LE Strength taining/ROM;UE/LE Coordination activities;Wheelchair propulsion/positioning PT Recommendation Equipment Recommended: Rolling walker with 5" wheels  PT Evaluation Precautions/Restrictions Precautions Precautions: Back;Sternal;Fall Precaution Comments: Patient required max verbal cues to recall back and sternal precautions Required Braces or Orthoses: Yes Spinal Brace: Thoracolumbosacral orthotic;Applied in supine position Restrictions Weight Bearing Restrictions: No Pain 5/10 pain, biggest complaint by patient in back and ribs on L side. RN aware, premedicated Home Living/Prior Functioning Home Living Lives With: Family Type of Home: House Home Layout: One level Home Access: Other (comment) Entrance Stairs-Number of Steps: threshold Bathroom Shower/Tub: Tub/shower unit;Walk-in shower;Curtain;Door (walk-in with door) Bathroom Toilet: Standard Bathroom Accessibility: Yes How Accessible: Accessible via walker Home  Adaptive Equipment: Straight cane Prior Function Level of Independence: Independent with basic ADLs;Independent with homemaking with ambulation;Independent with gait;Independent with transfers Able to  Take Stairs?: Yes Driving: Yes Vocation: Unemployed Comments: Daughter attends school during the day and mother can provide min-supervision assistance Vision/Perception  Vision - History Baseline Vision: No visual deficits Patient Visual Report: No change from baseline Vision - Assessment Eye Alignment: Within Functional Limits Perception Perception: Within Functional Limits Praxis Praxis: Intact  Cognition Overall Cognitive Status: Appears within functional limits for tasks assessed Arousal/Alertness: Awake/alert Orientation Level: Oriented X4;Oriented to person;Oriented to place;Oriented to time;Oriented to situation Memory: Appears intact (patient reports decreased memory after stroke) Awareness: Appears intact Problem Solving: Appears intact Safety/Judgment: Appears intact Sensation Sensation Light Touch: Appears Intact (lower extremities) Proprioception: Appears Intact Coordination Gross Motor Movements are Fluid and Coordinated: Yes Fine Motor Movements are Fluid and Coordinated: Yes Motor  Motor Motor: Within Functional Limits (generalized weakness)     Trunk/Postural Assessment  Cervical Assessment Cervical Assessment: Within Functional Limits Thoracic Assessment Thoracic Assessment: Exceptions to [facility] (back precautions, TLSO brace) Lumbar Assessment Lumbar Assessment: Exceptions to [facility] (back precautions; TLSO) Postural Control Postural Control: Within Functional Limits  Balance Balance Balance Assessed: Yes Static Sitting Balance Static Sitting - Level of Assistance: 6: Modified independent (Device/Increase time) Dynamic Sitting Balance Dynamic Sitting - Level of Assistance: 6: Modified independent (Device/Increase time) Static Standing Balance Static  Standing - Level of Assistance: 4: Min assist Dynamic Standing Balance Dynamic Standing - Level of Assistance: 4: Min assist Extremity Assessment  RUE Assessment RUE Assessment: Within Functional Limits LUE Assessment LUE Assessment: Within Functional Limits RLE Assessment RLE Assessment: Within Functional Limits LLE Assessment LLE Assessment: Within Functional Limits  See FIM for current functional status Refer to Care Plan for Long Term Goals  Recommendations for other services: None  Discharge Criteria: Patient will be discharged from PT if patient refuses treatment 3 consecutive times without medical reason, if treatment goals not met, if there is a change in medical status, if patient makes no progress towards goals or if patient is discharged from hospital.  The above assessment, treatment plan, treatment alternatives and goals were discussed and mutually agreed upon: by patient  Individual treatment initiated for gait training through obstacle course with RW x 100' with steady A intermittently when balance challenged making turns and stepping over objects. Stair training with min A up/down 5 steps leading with R foot due to pain on L side. Reviewed back precautions: pt recalled 3/3 precautions independently and demonstrated log roll technique with bed mobility. Gait for general strengthening x 150' on unit with RW, cueing for posture, good stride length noted. Assisted back to bed to rest with supervision and therapist removed brace. Pain appears to be biggest limiting factor and pt states he just wants to get home.  Karolee Stamps [facility] 10/02/2011, 10:57 AM

## 2011-10-02 NOTE — Progress Notes (Signed)
[facility] Individual Statement of Services  Patient Name:  Anthony Hoover  Date:  10/02/2011  Welcome to the [facility]patient [facility].  Our goal is to provide you with an individualized program based on your diagnosis and situation, designed to meet your specific needs.  With this comprehensive rehabilitation program, you will be expected to participate in at least 3 hours of rehabilitation therapies Monday-Friday, with modified therapy programming on the weekends.  Your rehabilitation program will include the following services:  Physical Therapy (PT), Occupational Therapy (OT), 24 hour per day rehabilitation nursing, Therapeutic Recreaction (TR), Case Management (RN and Child psychotherapist), Rehabilitation Medicine, Nutrition Services and Pharmacy Services  Weekly team conferences will be held on  Tuesday to discuss your progress.  Your RN Case Designer, television/film set will talk with you frequently to get your input and to update you on team discussions.  Team conferences with you and your family in attendance may also be held.  Expected length of stay: about 7 days Overall anticipated outcome: Supervision-Min Assist  Depending on your progress and recovery, your program may change.  Your RN Case Estate agent will coordinate services and will keep you informed of any changes.  Your RN Sports coach and SW names and contact numbers are listed  below.  The following services may also be recommended but are not provided by the Inpatient [facility] Center:   Driving Evaluations  Home Health Rehabiltiation Services  Outpatient Rehabilitatation [facility]  Vocational Rehabilitation   Arrangements will be made to provide these services after discharge if needed.  Arrangements include referral to agencies that provide these services.  Your insurance has been verified to be:  Medicaid [employer] primary doctor is:  Dr. Janna Arch  Pertinent  information will be shared with your doctor and your insurance company.  Case Manager: Melanee Spry, [facility] [phone]  Social Worker:  [place], [place] [phone]  Information discussed with and copy given to patient by: [name], 10/02/2011, 11:47 AM

## 2011-10-02 NOTE — Progress Notes (Signed)
Physical Therapy Session Note  Patient Details  Name: Anthony Hoover MRN: [id number] Date of Birth: 12-04-1965  Today's Date: 10/02/2011 Time: 1300-1340 Time Calculation (min): 40 min  Short Term Goals: Week 1:  PT Short Term Goal 1 (Week 1): = LTGs  Skilled Therapeutic Interventions/Progress Updates: Pain still an issue with pt. Therapist applied TLSO in supine, pt rolling mod I using rails. Gait with RW for general strengthening and mobility x 150' x 2 with supervision. Administered Berg Balance Test. Patient demonstrates increased fall risk as noted by score of  27 /56 on Berg Balance Scale.  (<36= high risk for falls, close to 100%; 37-45 significant >80%; 46-51 moderate >50%; 52-55 lower >25%). Limited due to back precautions for some of the items (indicated below). Discussed results with pt who verbalized understanding, but stated he wouldn't be doing a lot of these things at home. Explained to pt how the activities in the test relay to real life movements and scenarios. Requested to return to bed due to pain.      Therapy Documentation Precautions:  Precautions Precautions: Back;Sternal;Fall Precaution Comments: Patient required max verbal cues to recall back and sternal precautions Required Braces or Orthoses: Yes Spinal Brace: Thoracolumbosacral orthotic;Applied in supine position Restrictions Weight Bearing Restrictions: No Pain: Unrated, but complains back and side hurts with all mobility     Balance: Standardized Balance Assessment Standardized Balance Assessment: Berg Balance Test Berg Balance Test Sit to Stand: Able to stand  independently using hands Standing Unsupported: Able to stand 2 minutes with supervision Sitting with Back Unsupported but Feet Supported on Floor or Stool: Able to sit 2 minutes under supervision Stand to Sit: Controls descent by using hands Transfers: Able to transfer with verbal cueing and /or supervision Standing Unsupported with Eyes  Closed: Able to stand 10 seconds with supervision Standing Ubsupported with Feet Together: Able to place feet together independently but unable to hold for 30 seconds From Standing, Reach Forward with Outstretched Arm: Reaches forward but needs supervision (limited due to pain and back precautions) From Standing Position, Pick up Object from Floor: Unable to try/needs assist to keep balance (back precautions) From Standing Position, Turn to Look Behind Over each Shoulder: Turn sideways only but maintains balance Turn 360 Degrees: Needs close supervision or verbal cueing Standing Unsupported, Alternately Place Feet on Step/Stool: Able to complete >2 steps/needs minimal assist Standing Unsupported, One Foot in Front: Able to take small step independently and hold 30 seconds Standing on One Leg: Tries to lift leg/unable to hold 3 seconds but remains standing independently Total Score: 27   See FIM for current functional status  Therapy/Group: Individual Therapy  Karolee Stamps [facility] 10/02/2011, 1:43 PM

## 2011-10-03 NOTE — Progress Notes (Signed)
Patient ID: Anthony Hoover, male   DOB: 1966-02-04, 46 y.o.   MRN: [id number] Updated pt yesterday afternoon about team conf:  ELOS 10/06/11 Mod I goals, except, brace don/doff.  Today spoke w/ pt's mother about coming in for family ed--she plans to come tomorrow afternoon 4/4 at 2pm.  Therapists are considering moving d/c up to Friday 10/05/11.

## 2011-10-03 NOTE — Progress Notes (Signed)
Patient ID: LAWRNCE REYEZ, male   DOB: [birth year], 46 y.o.   MRN: [id number] Patient ID: JAEVIN MEDEARIS, male   DOB: 05/12/1966, 47 y.o.   MRN: [id number] Subjective/Complaints: 4/3- back still sore when he moves.  Appetite not great  Objective: Vital Signs: Blood pressure 150/101, pulse 86, temperature 98.2 F (36.8 C), temperature source Oral, resp. rate 20, height 5' 9.5" (1.765 m), weight 81.1 kg (178 lb 12.7 oz), SpO2 95.00%. No results found.  Basename 10/02/11 0645  WBC 9.9  HGB 9.0*  HCT 27.0*  PLT 332    Basename 10/02/11 0645  NA 137  K 3.3*  CL 99  CO2 27  GLUCOSE 110*  BUN 16  CREATININE 0.80  CALCIUM 9.3   CBG (last 3)  No results found for this basename: GLUCAP:3 in the last 72 hours  Wt Readings from Last 3 Encounters:  10/01/11 81.1 kg (178 lb 12.7 oz)  09/25/11 82.6 kg (182 lb 1.6 oz)    Physical Exam:  General appearance: alert, cooperative, appears stated age and no distress Head: Normocephalic, without obvious abnormality, atraumatic Eyes: conjunctivae/corneas clear. PERRL, EOM's intact. Fundi benign. Ears: normal TM's and external ear canals both ears Nose: Nares normal. Septum midline. Mucosa normal. No drainage or sinus tenderness. Throat: lips, mucosa, and tongue normal; teeth and gums normal Neck: no adenopathy, no carotid bruit, no JVD, supple, symmetrical, trachea midline and thyroid not enlarged, symmetric, no tenderness/mass/nodules Back: symmetric, no curvature. ROM normal. No CVA tenderness. Resp: clear to auscultation bilaterally Cardio: regular rate and rhythm, S1, S2 normal, no murmur, click, rub or gallop GI: soft, non-tender; bowel sounds normal; no masses,  no organomegaly Extremities: extremities normal, atraumatic, no cyanosis or edema Pulses: 2+ and symmetric Skin: Skin color, texture, turgor normal. No rashes or lesions Neurologic: Grossly normal. Pain inhibition weakness. No sensory changes. CN exam normal. Cognition is  intact. Moves all 4's fairly evenly. DTR's 1+ Incision/Wound: bruises, abrasions stable 4/3  Assessment/Plan: 1. Functional deficits secondary to T8 and T10 compression fx's which require 3+ hours per day of interdisciplinary therapy in a comprehensive inpatient rehab setting. Physiatrist is providing close team supervision and 24 hour management of active medical problems listed below. Physiatrist and rehab team continue to assess barriers to discharge/monitor patient progress toward functional and medical goals. FIM: FIM - Bathing Bathing Steps Patient Completed: Chest;Right Arm;Abdomen;Right upper leg;Left upper leg;Left Arm Bathing: 3: Mod-Patient completes 5-7 14f 10 parts or 50-74%  FIM - Upper Body Dressing/Undressing Upper body dressing/undressing steps patient completed: Thread/unthread right sleeve of pullover shirt/dresss;Thread/unthread left sleeve of pullover shirt/dress;Put head through opening of pull over shirt/dress;Pull shirt over trunk Upper body dressing/undressing: 5: Set-up assist to: Obtain clothing/put away FIM - Lower Body Dressing/Undressing Lower body dressing/undressing: 1: Total-Patient completed less than 25% of tasks  FIM - Toileting Toileting: 0: Activity did not occur  FIM - Archivist Transfers: 0-Activity did not occur  FIM - Banker Devices: Bed rails Bed/Chair Transfer: 5: Supine > Sit: Supervision (verbal cues/safety issues);5: Sit > Supine: Supervision (verbal cues/safety issues);4: Bed > Chair or W/C: Min A (steadying Pt. > 75%);4: Chair or W/C > Bed: Min A (steadying Pt. > 75%)  FIM - Locomotion: Wheelchair Locomotion: Wheelchair: 2: Travels 50 - 149 ft with supervision, cueing or coaxing FIM - Locomotion: Ambulation Locomotion: Ambulation Assistive Devices: Walker - Rolling;Orthosis Locomotion: Ambulation: 4: Travels 150 ft or more with minimal assistance  (Pt.>75%)  Comprehension Comprehension Mode: Auditory Comprehension:  6-Follows complex conversation/direction: With extra time/assistive device  Expression Expression Mode: Verbal Expression: 6-Expresses complex ideas: With extra time/assistive device  Social Interaction Social Interaction: 6-Interacts appropriately with others with medication or extra time (anti-anxiety, antidepressant).  Problem Solving Problem Solving: 6-Solves complex problems: With extra time  Memory Memory: 6-More than reasonable amt of time . DVT Prophylaxis/Anticoagulation: Pharmaceutical: Lovenox  2. Pain Management: continues on methadone- increased to 25mg  tid (home dose 20mg ). Also using oxycodone on prn basis.Continue robaxin. He exihibits classic chronic pain characteristics.  Needs to work through pain and also needs positive reinforcement  3. Mood: monitor for now.  4. Mild hyponatremia: improved today, but now hypokalemic-supplement 5. ABLA: Monitor H and H. iron supplement. Recheck later in the week. 6. Alcohol abuse: we are through ETOH withdrawal risk zone.  7. Constipation: Multifactorial. Miralax started. advised to increase po fluid intake and high fiber foods to help with elimination.  8. Anxiety disorder: Resume HS dose of xanax as also with complaints of insomnia. Positive reinforcement   LOS (Days) 2 A FACE TO FACE EVALUATION WAS PERFORMED  Dulcey Riederer T 10/03/2011, 7:22 AM

## 2011-10-03 NOTE — Progress Notes (Signed)
Physical Therapy Note  Patient Details  Name: Anthony Hoover MRN: [id number] Date of Birth: August 04, 1965 Today's Date: 10/03/2011  13:00- 13:45               Individual therapy 4/10 pain in left rib.  Pt resting in bed. Performed bilateral LE ROM in supine to decrease stiffness and to improve back pain including ankle pumps, heel slides , hip abduction, hip external rotator stretch, and hamstring stretch. Pt declined OOB due to pain.  Julian Reil 10/03/2011, 1:28 PM

## 2011-10-03 NOTE — Progress Notes (Signed)
Occupational Therapy Session Note  Patient Details  Name: Anthony Hoover MRN: [id number] Date of Birth: [birth year]  Today's Date: 10/03/2011  Short Term Goals: Week 1:  OT Short Term Goal 1 (Week 1): Patient will complete toilet transfer with supervision using AD prn OT Short Term Goal 2 (Week 1): Patient will complete LB dressing with minimal assistance using AE prn OT Short Term Goal 3 (Week 1): Patient will attempt tub/shower transfer with minimal assistance using DME prn OT Short Term Goal 4 (Week 1): Patient will maintain dynamic standing balance with supervision during ADL   1st Session Time: 0930-1030 Pain: 4/10 left ribcage, RN aware Individual Therapy Pt engaged in dressing tasks at bed level with HOB elevated.  Pt completed all tasks with supervision.  Pt directed therapist with donning of TLSO and readjusting TLSO after sitting EOB.  Pt amb in room with r/w to complete home mgmt tasks.  Pt states that pain in left rib cage escalates when he is up and moving.  Pt issued reacher to assist with retrieving items from floor.  Educated pt on r/w safety.  2nd Session Time: 1400-1430 Pain: 1/10 left ribcage but escalating to 7 when sitting EOB, RN aware Individual Therapy Pt engaged in bed mobility while maintaining back precautions.  Pt states that pain in left ribcage escalates with movement.  Discussed d/c plans and arranging from Mom to come in for education, primarily to assist with donning/doffing TLSO.  Discussed with pt option of taking shower.  Pt hesitant to take shower secondary anxiety regarding increased pain.  Pt stated he would think about it but he probably wouldn't take showers at home for some time.  Therapy Documentation Precautions:  Precautions Precautions: Back;Sternal;Fall Precaution Comments: Patient required max verbal cues to recall back and sternal precautions Required Braces or Orthoses: Yes Spinal Brace: Thoracolumbosacral orthotic;Applied in supine  position Restrictions Weight Bearing Restrictions: Yes (sternal and back precautions)    See FIM for current functional status  Therapy/Group: Individual Therapy  Rich Brave 10/03/2011, 3:32 PM

## 2011-10-03 NOTE — Progress Notes (Signed)
Physical Therapy Note  Patient Details  Name: Anthony Hoover MRN: [id number] Date of Birth: 11/28/1965 Today's Date: 10/03/2011  11:15-12:00 individual therapy 6/10 pain. Pt was unable to get medication at that time.  Bed mobility supervision with vc for hand placement with rolling. Total assist to don brace. Supine to sit supervision with rail. Gait in 2 x 150' controlled environment  Supervision with vc to relax shoulders and to tighten abdominal muscles to allow decrease push through UEs. Performed side steps in home environment, standing calf stretch 2 x 15 secods and LAQ x 15 each to stretch hamstrings.  Julian Reil 10/03/2011, 12:35 PM

## 2011-10-04 LAB — CBC
MCH: 32.9 pg (ref 26.0–34.0)
MCHC: 32.8 g/dL (ref 30.0–36.0)
MCV: 100.3 fL — ABNORMAL HIGH (ref 78.0–100.0)
Platelets: 389 10*3/uL (ref 150–400)
RDW: 12.8 % (ref 11.5–15.5)

## 2011-10-04 LAB — BASIC METABOLIC PANEL
BUN: 11 mg/dL (ref 6–23)
CO2: 26 mEq/L (ref 19–32)
Calcium: 9.4 mg/dL (ref 8.4–10.5)
Creatinine, Ser: 0.79 mg/dL (ref 0.50–1.35)
Glucose, Bld: 136 mg/dL — ABNORMAL HIGH (ref 70–99)

## 2011-10-04 MED ORDER — METHADONE HCL 10 MG PO TABS
30.0000 mg | ORAL_TABLET | Freq: Three times a day (TID) | ORAL | Status: DC
  Administered 2011-10-04 – 2011-10-05 (×3): 30 mg via ORAL
  Filled 2011-10-04 (×3): qty 3

## 2011-10-04 MED ORDER — LIDOCAINE 5 % EX PTCH
2.0000 | MEDICATED_PATCH | CUTANEOUS | Status: DC
  Filled 2011-10-04 (×3): qty 2

## 2011-10-04 MED ORDER — METHADONE HCL 10 MG PO TABS
5.0000 mg | ORAL_TABLET | Freq: Once | ORAL | Status: AC
  Administered 2011-10-04: 5 mg via ORAL
  Filled 2011-10-04: qty 1

## 2011-10-04 MED ORDER — OXYCODONE HCL 5 MG PO TABS
10.0000 mg | ORAL_TABLET | ORAL | Status: DC | PRN
  Administered 2011-10-04 – 2011-10-05 (×4): 10 mg via ORAL
  Filled 2011-10-04 (×4): qty 2

## 2011-10-04 NOTE — Progress Notes (Signed)
Social Work Assessment and Plan Social Work Assessment and Plan  Patient Details  Name: Anthony Hoover MRN: [id number] Date of Birth: Nov 14, 1965  Today's Date: 10/04/2011  Problem List:  Patient Active Problem List  Diagnoses  . Fall from tree  . Multiple fractures of ribs of left side  . Right pulmonary contusion  . Hypertension  . Chronic back pain  . Wedge compression fracture of T8 vertebra  . Wedge compression fracture of T10 vertebra  . Sternal fracture  . Fracture of transverse process of thoracic vertebra  . Heavy alcohol use  . Acute blood loss anemia  . History of CVA (cerebrovascular accident)  . Tobacco use  . Back pain  . CVA (cerebral infarction)  . Anxiety disorder   Past Medical History:  Past Medical History  Diagnosis Date  . Hypertension   . Back pain     pain management by Dr. Janna Arch  . CVA (cerebral infarction)     2009  . Insomnia   . Anxiety disorder    Past Surgical History:  Past Surgical History  Procedure Date  . Umbilical hernia repair    Social History:  reports that he has been smoking Cigarettes.  He has been smoking about .5 packs per day. He does not have any smokeless tobacco history on file. He reports that he drinks about 9 - 12 ounces of alcohol per week. He reports that he does not use illicit drugs.  Family / Support Systems Marital Status: Single Patient Roles: Parent Spouse/Significant Other: NA Children: 31 year old daughter at home with pt Other Supports: Brother and sister-in-law also living next door - pt notes sister-in-law is Higher education careers adviser and "can help out alot if I need it" Anticipated Caregiver: Mother Ability/Limitations of Caregiver: No limitiations overall; daughter in school during the day Family Dynamics: Mother and daughter very supportive of pt and willing to provide any needed assistance  Social History Preferred language: English Religion:  Cultural Background: NA Education: HS grad Read: Yes Write:  Yes Employment Status: Unemployed Date Retired/Disabled/Unemployed: basically since CVA in 2009 and has SSD application in place Legal Hisotry/Current Legal Issues: None Guardian/Conservator: None   Abuse/Neglect Physical Abuse: Denies Verbal Abuse: Denies Sexual Abuse: Denies Exploitation of patient/patient's resources: Denies Self-Neglect: Denies  Emotional Status Pt's affect, behavior adn adjustment status: Pt very pleasant and matter-of-fact about his injury, his rehab and his return home with family.  Denies any concerns or emotional distress at this time.  Feels he is making good progress and is ready to go home.  Depression screen not indicated for pt at this time as no report of any s/s of depression or anxiety now or PTA Recent Psychosocial Issues: None Pyschiatric History: None Substance Abuse History: ETOH screening completed by LCSW on acute unit - pt notes he does consume "large amounts" of beer more than 4 days per week, yet does not feel it was a problem that affected his daily living.  No interventions were indicated.  Patient / Family Perceptions, Expectations & Goals Pt/Family understanding of illness & functional limitations: Pt , mother and family with basic understanding of pt's injuries and need for CIR.  Mother in today for education on don-doffing brace.   Premorbid pt/family roles/activities: Pt and family all very independent and active at home and community level.  Mother primarily took care of the household with pt managing his finances and assiting with home management as needed. Anticipated changes in roles/activities/participation: Mother to assume some caregiving responsibilities  in regards to assisting with pt's brace; pt independent with all other daily activities Pt/family expectations/goals: Per pt,"I think I'm doing just fine...ready to go"  [employer]: None Premorbid Home Care/DME Agencies: None Transportation available at  discharge: yes  Discharge Planning Living Arrangements: Parent;Children Support Systems: Children;Parent;Other relatives Type of Residence: Private residence Insurance Resources: Medicaid (specify county) [facility]) Financial Resources: Family Support (SSDI application being processed ) Surveyor, quantity Screen Referred: No Living Expenses: Lives with family Money Management: Patient Do you have any problems obtaining your medications?: No Home Management: pt, mother and daughter share upkeep on home Patient/Family Preliminary Plans: Pt to return home with mother and daughter Social Work Anticipated Follow Up Needs: HH/OP Expected length of stay: 5-7 days?  Clinical Impression Pleasant, oriented gentleman here after fall from tree and suffering multiple spinal fx.  Prgressing well and anticipate short LOS.  Good family support. No significant emotional distress notes/ reported.   Brinlee Gambrell 10/04/2011, 4:24 PM

## 2011-10-04 NOTE — Progress Notes (Signed)
Physical Therapy Session Note  Patient Details  Name: Anthony Hoover MRN: [id number] Date of Birth: Apr 11, 1966  Today's Date: 10/04/2011 Time: 1100-1153 Time Calculation (min): 53 min  Short Term Goals   See d/c goals Skilled Therapeutic Interventions/Progress Updates:    Grad day, as pt reports d/c date to be tomorrow, OT and PT in agreement.  Pt request RW be ordered. Gait training was performed longer distances (150 ft) with RW and shorter up to 50 ft without device mod I, pt to use RW mostly to decrease pressure on spine.  Instructed and return demonstrated safety on curb mod I with RW and climbed flight of steps reciprocally with B rails modI.  Pt pleased with ability to step reciprocally today.Car transfer stand pivot with Rw mod I and mod I all bed mobility on regular bed.  Discussed sternall and back precautions while pt resting, rest breaks required d/t increased pain with activity.  Family to attend education this afternoon as pt needs assist to don and doff brace.  Therapy Documentation Precautions: back, sternal, donn TLSO in supine     Pain: Pain Assessment Pain Assessment: 0-10 Pain Score:   1at rest (increased with activity despite meds) Faces Pain Scale: No hurt Pain Type: Acute pain Pain Location: Back Pain Orientation: Left Pain Descriptors: Pressure Pain Onset: On-going Pain Intervention(s): RN made aware;Repositioned;Rest  Mobility: Bed Mobility Bed Mobility: Yes Rolling Right: 6: Modified independent (Device/Increase time) Rolling Left: 6: Modified independent (Device/Increase time) Supine to Sit: 6: Modified independent (Device/Increase time) Sit to Supine: 6: Modified independent (Device/Increase time) Transfers Sit to Stand: 6: Modified independent (Device/Increase time) Stand to Sit: 6: Modified independent (Device/Increase time) Locomotion : Ambulation Ambulation: Yes Ambulation/Gait Assistance: 6: Modified independent (Device/Increase  time) Ambulation Distance (Feet): 150 Feet Assistive device: Rolling walker Gait Gait velocity: 2.52 ft/sec Stairs / Additional Locomotion Stairs: Yes Stairs Assistance: 6: Modified independent (Device/Increase time) Stair Management Technique: Two rails Number of Stairs: 13  Curb: 6: Modified independent (Device/increase time)          Other Treatments:    See FIM for current functional status  Therapy/Group: Individual Therapy  [name] 10/04/2011, 12:09 PM

## 2011-10-04 NOTE — Progress Notes (Signed)
Physical Therapy Discharge Summary  Patient Details  Name: Anthony Hoover MRN: [id number] Date of Birth: Sep 26, 1965  Today's Date: 10/04/2011 Time: 8413-2440 Time Calculation (min): 20 min  Patient has met 7 of 7 long term goals due to improved activity tolerance, improved balance and ability to compensate for deficits.  Patient to discharge at an ambulatory level Modified Independent. Pt to use RW mostly but can ambulate shorter distances safely without the device.   Patient's care partner is independent to provide the necessary physical assistance at discharge. Mom I donning/doffing TLSO and mom verbalizes understanding of pt sternal and back precautions.  Reasons goals not met: n/a  Recommendation:  Patient requires no skilled PT immediately at D/C but may benefit from further PT once out of TLSO.  Equipment: RW  Reasons for discharge: treatment goals met  Patient/family agrees with progress made and goals achieved: Yes  PT Discharge Precautions/Restrictions Precautions Precautions: Back;Sternal Spinal Brace: Thoracolumbosacral orthotic;Applied in supine position    Pain Pain Assessment Pain Assessment: 0-10 Pain Score:   1 Faces Pain Scale: Hurts a little bit Pain Type: Acute pain Pain Location: Rib cage Pain Orientation: Left Pain Descriptors: Discomfort Pain Onset: On-going Pain Intervention(s): RN made aware Vision/Perception     Cognition Overall Cognitive Status: Appears within functional limits for tasks assessed Sensation  intact Motor  Motor Motor: Within Functional Limits  Mobility see FIM, Mod I   Locomotion  Ambulation Ambulation: Yes Ambulation/Gait Assistance: 6: Modified independent (Device/Increase time) Ambulation Distance (Feet): 150150 Feet Assistive device: Rolling walker Gait Gait velocity: 2.52 ft/sec Stairs / Additional Locomotion Stairs: Yes Stairs Assistance: 6: Modified independent (Device/Increase time) Stair Management  Technique: Two rails Number of Stairs: 13  Curb: 6: Modified independent (Device/increase time)  Trunk/Postural Assessment  Cervical Assessment Cervical Assessment: Within Functional Limits Thoracic Assessment Thoracic Assessment: Exceptions to [facility] Thoracic AROM Overall Thoracic AROM Comments: TLSO Lumbar Assessment Lumbar Assessment: Exceptions to [facility] Lumbar AROM Overall Lumbar AROM Comments: TLSO  Balance Balance Balance Assessed: Yes Static Sitting Balance Static Sitting - Level of Assistance: 6: Modified independent (Device/Increase time) Dynamic Sitting Balance Dynamic Sitting - Level of Assistance: 6: Modified independent (Device/Increase time) Static Standing Balance Static Standing - Level of Assistance: 6: Modified independent (Device/Increase time) Dynamic Standing Balance Dynamic Standing - Level of Assistance: 6: Modified independent (Device/Increase time) Extremity Assessment      RLE Assessment RLE Assessment: Within Functional Limits LLE Assessment LLE Assessment: Within Functional Limits  See FIM for current functional status TIME IN/OUT  1027-2536  Skilled intervention:Family education with mother and 81 yo daughter who demonstrated I donning and doffing TLSO, pt ambulated in room with and without RW then declined further activity d/t wanting to rest. (missed 10 min)  Pt did c/o back pain but did not give number and was premedicated.  Mom verbalized understanding of all precautions and recommendations for home modifications/safety precautions. [name] 10/04/2011, 4:07 PM

## 2011-10-04 NOTE — Progress Notes (Signed)
Patient ID: Anthony Hoover, male   DOB: Dec 05, 1965, 46 y.o.   MRN: [id number] Patient ID: Anthony Hoover, male   DOB: May 30, 1966, 46 y.o.   MRN: [id number] Subjective/Complaints: 4/4- back still sore when he moves.  Feels he's not getting enough pain medication-especially with his chronic pain history. Appetite not great  Objective: Vital Signs: Blood pressure 123/79, pulse 92, temperature 98.5 F (36.9 C), temperature source Oral, resp. rate 20, height 5\' 8"  (1.727 m), weight 126.7 kg (279 lb 5.2 oz), SpO2 95.00%. No results found.  Basename 10/04/11 0600 10/02/11 0645  WBC 10.7* 9.9  HGB 9.8* 9.0*  HCT 29.9* 27.0*  PLT 389 332    Basename 10/04/11 0600 10/02/11 0645  NA 136 137  K 3.4* 3.3*  CL 99 99  CO2 26 27  GLUCOSE 136* 110*  BUN 11 16  CREATININE 0.79 0.80  CALCIUM 9.4 9.3   CBG (last 3)  No results found for this basename: GLUCAP:3 in the last 72 hours  Wt Readings from Last 3 Encounters:  10/03/11 126.7 kg (279 lb 5.2 oz)  09/25/11 82.6 kg (182 lb 1.6 oz)    Physical Exam:  General appearance: alert, cooperative, appears stated age and no distress Head: Normocephalic, without obvious abnormality, atraumatic Eyes: conjunctivae/corneas clear. PERRL, EOM's intact. Fundi benign. Ears: normal TM's and external ear canals both ears Nose: Nares normal. Septum midline. Mucosa normal. No drainage or sinus tenderness. Throat: lips, mucosa, and tongue normal; teeth and gums normal Neck: no adenopathy, no carotid bruit, no JVD, supple, symmetrical, trachea midline and thyroid not enlarged, symmetric, no tenderness/mass/nodules Back: symmetric, no curvature. ROM normal. No CVA tenderness. Resp: clear to auscultation bilaterally Cardio: regular rate and rhythm, S1, S2 normal, no murmur, click, rub or gallop GI: soft, non-tender; bowel sounds normal; no masses,  no organomegaly Extremities: extremities normal, atraumatic, no cyanosis or edema Pulses: 2+ and symmetric Skin:  Skin color, texture, turgor normal. No rashes or lesions Neurologic: Grossly normal. Pain inhibition weakness. No sensory changes. CN exam normal. Cognition is intact. Moves all 4's fairly evenly. DTR's 1+ Incision/Wound: bruises, abrasions stable, ongoing truncal pain. 4/4  Assessment/Plan: 1. Functional deficits secondary to T8 and T10 compression fx's which require 3+ hours per day of interdisciplinary therapy in a comprehensive inpatient rehab setting. Physiatrist is providing close team supervision and 24 hour management of active medical problems listed below. Physiatrist and rehab team continue to assess barriers to discharge/monitor patient progress toward functional and medical goals.  Supervision overall.  Family education today? For discharge tomorrow am. FIM: FIM - Bathing Bathing Steps Patient Completed: Chest;Right Arm;Left Arm;Abdomen;Front perineal area;Right upper leg;Left upper leg;Right lower leg (including foot);Left lower leg (including foot) Bathing: 4: Min-Patient completes 8-9 76f 10 parts or 75+ percent  FIM - Upper Body Dressing/Undressing Upper body dressing/undressing steps patient completed: Thread/unthread right sleeve of pullover shirt/dresss;Thread/unthread left sleeve of pullover shirt/dress;Put head through opening of pull over shirt/dress;Pull shirt over trunk Upper body dressing/undressing: 5: Set-up assist to: Apply TLSO, cervical collar FIM - Lower Body Dressing/Undressing Lower body dressing/undressing steps patient completed: Thread/unthread right underwear leg;Thread/unthread left underwear leg;Pull underwear up/down;Thread/unthread right pants leg;Thread/unthread left pants leg;Pull pants up/down;Don/Doff right sock;Don/Doff left sock;Don/Doff right shoe;Fasten/unfasten left shoe;Fasten/unfasten right shoe;Don/Doff left shoe Lower body dressing/undressing: 5: Supervision: Safety issues/verbal cues  FIM - Toileting Toileting: 1: Total-Patient completed  zero steps, helper did all 3  FIM - Toilet Transfers Toilet Transfers: 5-To toilet/BSC: Supervision (verbal cues/safety issues);5-From toilet/BSC: Supervision (verbal cues/safety issues)  FIM - Bed/Chair Transfer Bed/Chair Transfer Assistive Devices: Walker;HOB elevated;Orthosis Bed/Chair Transfer: 5: Supine > Sit: Supervision (verbal cues/safety issues);5: Sit > Supine: Supervision (verbal cues/safety issues);5: Bed > Chair or W/C: Supervision (verbal cues/safety issues);5: Chair or W/C > Bed: Supervision (verbal cues/safety issues)  FIM - Locomotion: Wheelchair Locomotion: Wheelchair: 0: Activity did not occur FIM - Locomotion: Ambulation Locomotion: Ambulation Assistive Devices: Designer, industrial/product Ambulation/Gait Assistance: 5: Supervision Locomotion: Ambulation: 5: Travels 150 ft or more with supervision/safety issues  Comprehension Comprehension Mode: Auditory Comprehension: 7-Follows complex conversation/direction: With no assist  Expression Expression Mode: Verbal Expression: 7-Expresses complex ideas: With no assist  Social Interaction Social Interaction: 7-Interacts appropriately with others - No medications needed.  Problem Solving Problem Solving: 7-Solves complex problems: Recognizes & self-corrects  Memory Memory: 7-Complete Independence: No helper . DVT Prophylaxis/Anticoagulation: Pharmaceutical: Lovenox  2. Pain Management: continues on methadone- increase to 30mg  tid (home dose 20mg ). Also using oxycodone on prn basis.Continue robaxin. He exihibits classic chronic pain characteristics.  Needs to work through pain and also needs positive reinforcement. -i had a long discussion regarding the decision making process for his pain regimen.  There are acute on chronic issues here.  He has to understand that we can't just carelessly escalate his pain medication doses.  His chronic pain also will predispose him to more acute pain. Need to let his pcp know that his methadone  has been adjusted before he goes home.  3. Mood: monitor for now.  4. Mild hyponatremia: improved today, but now hypokalemic-supplementing 5. ABLA: Monitor H and H. iron supplement. Recheck later in the week. 6. Alcohol abuse: we are through ETOH withdrawal risk zone.  7. Constipation: Multifactorial. Miralax started. advised to increase po fluid intake and high fiber foods to help with elimination.  8. Anxiety disorder: Resumed HS dose of xanax as also with complaints of insomnia. Positive reinforcement 9. L-rib fractures and sternal fractures:   lidocaine patch.  LOS (Days) 3 A FACE TO FACE EVALUATION WAS PERFORMED  Jacquelynn Cree 10/04/2011, 8:27 AM

## 2011-10-04 NOTE — Progress Notes (Signed)
Social Work   Pt ready for d/c tomorrow as family ed has been completed today with mother.  PT and OT report no f/u therapy needed until brace is discontinued - pt and mother aware and agreeable with this.  Pt and team ready for 4/5 d/c (change from initial 4/6 target date)  Anthony Hoover

## 2011-10-04 NOTE — Progress Notes (Signed)
Occupational Therapy Session Note  Patient Details  Name: Anthony Hoover MRN: [id number] Date of Birth: June 21, 1966  Today's Date: 10/04/2011  1st Session Time: 0930-1030 Individual Therapy Pain: 1/10; discomfort in left rib cage. Pt stated pain escalated with activity.  ADL retraining including bathing and dressing tasks at bed level.  Pt completes all tasks independently after set up.  Pt requires assistance donning TLSO.  Demonstrated toilet aid and pt agreed to try in later session.  Focus on safety awareness and activity tolerance.  Pt states that pain is greatest limiting factor.  Pt also states that he won't be doing much when he goes home.  2nd Session Time: 1400-1435 Time Calculation (min): 35 min  Short Term Goals: Week 1:  OT Short Term Goal 1 (Week 1): Patient will complete toilet transfer with supervision using AD prn OT Short Term Goal 1 - Progress (Week 1): Met OT Short Term Goal 2 (Week 1): Patient will complete LB dressing with minimal assistance using AE prn OT Short Term Goal 2 - Progress (Week 1): Met OT Short Term Goal 3 (Week 1): Patient will attempt tub/shower transfer with minimal assistance using DME prn OT Short Term Goal 3 - Progress (Week 1): Met OT Short Term Goal 4 (Week 1): Patient will maintain dynamic standing balance with supervision during ADL OT Short Term Goal 4 - Progress (Week 1): Met  Skilled Therapeutic Interventions/Progress Updates:    Mom and dtr present for family education with emphasis on donning/doffing TLSO.  Mom observed X 1 and dtr observed X 2.  Mom donned/doffed TLSO on pt with verbal cues from pt.  Pt independent with providing directions to caregiver.  Discussed with Mom pt's ability to bathe and dress self at bed level after supplies and clothing placed within reach.  Therapy Documentation Precautions:  Precautions Precautions: Fall;Sternal;Back Precaution Comments: apply brace in supine Required Braces or Orthoses: Yes Spinal  Brace: Thoracolumbosacral orthotic Restrictions Weight Bearing Restrictions: Yes (sternal and back precautions)   Pain: Pain Assessment Pain Assessment: 0-10 Pain: 4 Pain Type: Acute pain Pain Location: Rib cage Pain Orientation: Left Pain Descriptors: Discomfort Pain Onset: On-going Pain Intervention(s): RN made aware  See FIM for current functional status  Therapy/Group: Individual Therapy  Rich Brave 10/04/2011, 3:19 PM

## 2011-10-05 DIAGNOSIS — Z5189 Encounter for other specified aftercare: Secondary | ICD-10-CM

## 2011-10-05 DIAGNOSIS — W19XXXA Unspecified fall, initial encounter: Secondary | ICD-10-CM

## 2011-10-05 DIAGNOSIS — S22009A Unspecified fracture of unspecified thoracic vertebra, initial encounter for closed fracture: Secondary | ICD-10-CM

## 2011-10-05 MED ORDER — SENNOSIDES-DOCUSATE SODIUM 8.6-50 MG PO TABS: 2.0000 | ORAL_TABLET | Freq: Every day | ORAL | Status: AC

## 2011-10-05 MED ORDER — METHOCARBAMOL 500 MG PO TABS: 1000.0000 mg | ORAL_TABLET | Freq: Four times a day (QID) | ORAL | Status: AC

## 2011-10-05 MED ORDER — ADULT MULTIVITAMIN W/MINERALS CH: 1.0000 | ORAL_TABLET | Freq: Every day | ORAL | Status: DC

## 2011-10-05 MED ORDER — METHADONE HCL 10 MG PO TABS: 30.0000 mg | ORAL_TABLET | Freq: Three times a day (TID) | ORAL | Status: AC

## 2011-10-05 MED ORDER — LIDOCAINE 5 % EX PTCH: MEDICATED_PATCH | CUTANEOUS | Status: DC

## 2011-10-05 MED ORDER — FOLIC ACID 1 MG PO TABS: 1.0000 mg | ORAL_TABLET | Freq: Every day | ORAL | Status: AC

## 2011-10-05 MED ORDER — OXYCODONE HCL 10 MG PO TABS: 10.0000 mg | ORAL_TABLET | Freq: Four times a day (QID) | ORAL | Status: AC | PRN

## 2011-10-05 NOTE — Progress Notes (Signed)
Occupational Therapy Discharge Summary and Session Note   Patient Details  Name: Anthony Hoover MRN: [id number] Date of Birth: 07-28-1965  Today's Date: 10/05/2011 Time: 0930-1020 Time Calculation (min): 50 min 1:1 OT Session ADL retraining including bathing at walk in shower and dressing at bed level.  Pt independent with directing care.  Pt amb in room with r/w to bathroom for toileting and shower transfer.  Pt completes dressing supine with HOB elevated at 30 degrees.  Focus on safety awareness and continued education.  Therapist instructed patient on changing pads in TLSO.  Pt following back and sternal precautions in functional activities independently.  Focus on safety awareness and dynamic standing balance to complete ADLs.  Discharge Summary  Patient has met 10 of 10 long term goals due to improved activity tolerance, improved balance, postural control, ability to compensate for deficits, improved awareness and improved coordination. Pt is supervision for bathing and dressing, mod I for toilet transfers and toileting, and supervision for shower transfers.  Pt requires assistance for donning/doffing TLSO.  Mom and daughter have demonstrated independence in donning/doffing TLSO with direction from pt.   Pt will d/c home with Mom and daughter.  Patient to discharge at overall set-up level for aspects of daily living except modified independent for toilet transfer and toileting level.  Patient's care partner is independent to provide the necessary set-up assist prn assistance at discharge.    Reasons goals not met: N/A; all goals met at this time.  Recommendation:  No additional occupational therapy recommended at this time.   Equipment: BSC, shower chair with back  Reasons for discharge: treatment goals met and discharge from hospital  Patient/family agrees with progress made and goals achieved: Yes  Pain Pain Assessment Pain Score:   2 Pain Type: Acute pain Pain Location: Rib  cage Pain Orientation: Left Pain Descriptors: Discomfort Pain Intervention(s): Repositioned Multiple Pain Sites: No  ADL ADL Eating: Independent Where Assessed-Eating: Bed level Grooming: Modified independent Where Assessed-Grooming: Standing at sink Upper Body Bathing: Supervision/safety Where Assessed-Upper Body Bathing: Shower Lower Body Bathing: Supervision/safety Where Assessed-Lower Body Bathing: Shower Upper Body Dressing: Setup Where Assessed-Upper Body Dressing: Bed level Lower Body Dressing: Setup Where Assessed-Lower Body Dressing: Bed level Toileting: Modified independent Where Assessed-Toileting: Bedside Commode Toilet Transfer: Modified independent Toilet Transfer Method: Event organiser: Distant supervision Film/video editor Method: Designer, industrial/product: Information systems manager with back  Vision/Perception  Vision - History Baseline Vision: No visual deficits Patient Visual Report: No change from baseline Vision - Assessment Eye Alignment: Within Functional Limits   Cognition Overall Cognitive Status: Appears within functional limits for tasks assessed Arousal/Alertness: Awake/alert Orientation Level: Oriented X4  Sensation Sensation Light Touch: Appears Intact Coordination Gross Motor Movements are Fluid and Coordinated: Yes Fine Motor Movements are Fluid and Coordinated: Yes  Motor  Motor Motor: Within Functional Limits   Extremity/Trunk Assessment RUE Assessment RUE Assessment: Within Functional Limits RUE Strength RUE Overall Strength Comments: limited testing secondary to back pain LUE Assessment LUE Assessment: Within Functional Limits LUE Strength LUE Overall Strength Comments: limited testing secondary to back pain  See FIM for current functional status  Rich Brave 10/05/2011, 10:33 AM

## 2011-10-05 NOTE — Discharge Instructions (Signed)
Inpatient Rehab Discharge Instructions  Anthony Hoover Discharge date and time: 10/05/11   Activities/Precautions/ Functional Status: Activity: no lifting, driving, or strenuous exercise for till cleared by Dr. Jeral Fruit Diet: regular diet Wound Care: keep wound clean and dry Functional status:  ___ No restrictions     ___ Walk up steps independently ___ 24/7 supervision/assistance   ___ Walk up steps with assistance _X__ Intermittent supervision/assistance  ___ Bathe/dress independently ___ Walk with walker     _X_ Bathe/dress with assistance ___ Walk Independently    ___ Shower independently ___ Walk with assistance    ___ Shower with assistance _X__ No alcohol     ___ Return to work/school ________  COMMUNITY REFERRALS UPON DISCHARGE:   None:    Medical Equipment/Items Ordered: walker,  3n1 commode and tub bench                                                     Agency/Supplier: [facility] [phone]   GENERAL COMMUNITY RESOURCES FOR PATIENT/FAMILY: Support Groups:***  N/A:***  Employment Assistance:***   N/A:*** Caregiver Support:***  N/A:*** Education Assistance:***   N/A:*** Mental Health:***   N/A:***  Home Modification:***   N/A:***      Special Instructions: Put brace on when flat in bed.  No driving till cleared by Dr. Jeral Fruit.  My questions have been answered and I understand these instructions. I will adhere to these goals and the provided educational materials after my discharge from the hospital.  Patient/Caregiver Signature _______________________________ Date __________  Clinician Signature _______________________________________ Date __________  Please bring this form and your medication list with you to all your follow-up doctor's appointments.

## 2011-10-05 NOTE — Progress Notes (Signed)
Pt. Discharged to home @ 1045 via wheelchair and NT.  Family accompanied pt upon discharge; all personal belongings in tow.  PA provided discharge instructions via handouts; pt. Verbalizes understanding.  No further questions at this time.

## 2011-10-05 NOTE — Progress Notes (Signed)
Patient ID: Anthony Hoover, male   DOB: 1965-09-23, 46 y.o.   MRN: [id number] Patient ID: Anthony Hoover, male   DOB: 21-Nov-1965, 46 y.o.   MRN: [id number] Subjective/Complaints: 4/5- excited to go home.  Objective: Vital Signs: Blood pressure 124/85, pulse 96, temperature 98.8 F (37.1 C), temperature source Oral, resp. rate 18, height 5\' 8"  (1.727 m), weight 126.7 kg (279 lb 5.2 oz), SpO2 95.00%. No results found.  Basename 10/04/11 0600  WBC 10.7*  HGB 9.8*  HCT 29.9*  PLT 389    Basename 10/04/11 0600  NA 136  K 3.4*  CL 99  CO2 26  GLUCOSE 136*  BUN 11  CREATININE 0.79  CALCIUM 9.4   CBG (last 3)  No results found for this basename: GLUCAP:3 in the last 72 hours  Wt Readings from Last 3 Encounters:  10/03/11 126.7 kg (279 lb 5.2 oz)  09/25/11 82.6 kg (182 lb 1.6 oz)    Physical Exam:  General appearance: alert, cooperative, appears stated age and no distress Head: Normocephalic, without obvious abnormality, atraumatic Eyes: conjunctivae/corneas clear. PERRL, EOM's intact. Fundi benign. Ears: normal TM's and external ear canals both ears Nose: Nares normal. Septum midline. Mucosa normal. No drainage or sinus tenderness. Throat: lips, mucosa, and tongue normal; teeth and gums normal Neck: no adenopathy, no carotid bruit, no JVD, supple, symmetrical, trachea midline and thyroid not enlarged, symmetric, no tenderness/mass/nodules Back: symmetric, no curvature. ROM normal. No CVA tenderness. Resp: clear to auscultation bilaterally Cardio: regular rate and rhythm, S1, S2 normal, no murmur, click, rub or gallop GI: soft, non-tender; bowel sounds normal; no masses,  no organomegaly Extremities: extremities normal, atraumatic, no cyanosis or edema Pulses: 2+ and symmetric Skin: Skin color, texture, turgor normal. No rashes or lesions Neurologic: Grossly normal. Pain inhibition weakness. No sensory changes. CN exam normal. Cognition is intact. Moves all 4's fairly evenly.  DTR's 1+ Incision/Wound: bruises, abrasions stable, ongoing truncal pain. 4/5  Assessment/Plan: 1. Functional deficits secondary to T8 and T10 compression fx's which require 3+ hours per day of interdisciplinary therapy in a comprehensive inpatient rehab setting. Physiatrist is providing close team supervision and 24 hour management of active medical problems listed below. Physiatrist and rehab team continue to assess barriers to discharge/monitor patient progress toward functional and medical goals.  Supervision overall.  Home today. F/u with NS and PCP   FIM: FIM - Bathing Bathing Steps Patient Completed: Chest;Right Arm;Left Arm;Abdomen;Front perineal area;Right upper leg;Buttocks;Left upper leg;Left lower leg (including foot);Right lower leg (including foot) Bathing: 5: Set-up assist to: Obtain items  FIM - Upper Body Dressing/Undressing Upper body dressing/undressing steps patient completed: Thread/unthread right sleeve of pullover shirt/dresss;Thread/unthread left sleeve of pullover shirt/dress;Put head through opening of pull over shirt/dress;Pull shirt over trunk Upper body dressing/undressing: 5: Set-up assist to: Obtain clothing/put away FIM - Lower Body Dressing/Undressing Lower body dressing/undressing steps patient completed: Thread/unthread right underwear leg;Thread/unthread left underwear leg;Pull underwear up/down;Pull pants up/down;Thread/unthread left pants leg;Thread/unthread right pants leg;Don/Doff right sock;Don/Doff left sock;Don/Doff right shoe;Fasten/unfasten left shoe;Fasten/unfasten right shoe;Don/Doff left shoe Lower body dressing/undressing: 5: Set-up assist to: Obtain clothing  FIM - Toileting Toileting: 1: Total-Patient completed zero steps, helper did all 3  FIM - Archivist Transfers: 5-To toilet/BSC: Supervision (verbal cues/safety issues);5-From toilet/BSC: Supervision (verbal cues/safety issues)  FIM - Programmer, systems Assistive Devices: Walker;Orthosis Bed/Chair Transfer: 6: Assistive device: no helper  FIM - Locomotion: Wheelchair Locomotion: Wheelchair: 1: Total Assistance/staff pushes wheelchair (Pt<25%) FIM - Locomotion: Ambulation Locomotion: Ambulation  Assistive Devices: Walker - Rolling;Orthosis Ambulation/Gait Assistance: 6: Modified independent (Device/Increase time) Locomotion: Ambulation: 6: Travels 150 ft or more with assistive device/no helper  Comprehension Comprehension Mode: Auditory Comprehension: 7-Follows complex conversation/direction: With no assist  Expression Expression Mode: Verbal Expression: 7-Expresses complex ideas: With no assist  Social Interaction Social Interaction: 7-Interacts appropriately with others - No medications needed.  Problem Solving Problem Solving: 7-Solves complex problems: Recognizes & self-corrects  Memory Memory: 7-Complete Independence: No helper . DVT Prophylaxis/Anticoagulation: Pharmaceutical: Lovenox  2. Pain Management: continues on methadone- increase to 30mg  tid (home dose 20mg ). Also using oxycodone on prn basis.Continue robaxin. He exihibits classic chronic pain characteristics.  Needs to work through pain and also needs positive reinforcement. -pain control better. We contacted his PCP regarding med changes. 3. Mood: monitor for now.  4. Mild hyponatremia: improved today, but now hypokalemic-supplementing 5. ABLA: Monitor H and H. iron supplement. Recheck later in the week. 6. Alcohol abuse: we are through ETOH withdrawal risk zone.  7. Constipation: Multifactorial. Miralax started. advised to increase po fluid intake and high fiber foods to help with elimination.  8. Anxiety disorder: Resumed HS dose of xanax as also with complaints of insomnia. Positive reinforcement 9. L-rib fractures and sternal fractures:   lidocaine patch.  LOS (Days) 4 A FACE TO FACE EVALUATION WAS PERFORMED  Love, [name] 10/05/2011, 9:01 AM

## 2011-10-05 NOTE — Progress Notes (Signed)
Social Work  Discharge Note  The overall goal for the admission was met for:   Discharge location: Yes - home with mother and daughter  Length of Stay: Yes - 4 days  Discharge activity level: Yes - modified independent - min assist (brace)  Home/community participation: Yes  Services provided included: MD, RD, PT, OT, RN, CM, TR, Pharmacy and SW  Financial Services: Medicaid  Follow-up services arranged: DME: rolling walker, 3n1 commode and tub seat via [facility]  Comments (or additional information): No therapy follow up recommended at this point - may need some after brace discontinued  Patient/Family verbalized understanding of follow-up arrangements: Yes  Individual responsible for coordination of the follow-up plan: patient  Confirmed correct DME delivered: Anthony Hoover 10/05/2011    Anthony Hoover

## 2011-10-08 NOTE — Discharge Summary (Signed)
NAMEJERICO, Anthony Hoover               ACCOUNT NO.:  [id number]  MEDICAL RECORD NO.:  [id number]  LOCATION:  4008                         FACILITY:  [facility]  PHYSICIAN:  Ranelle Oyster, M.D.DATE OF BIRTH:  1966-05-23  DATE OF ADMISSION:  10/01/2011 DATE OF DISCHARGE:  10/05/2011                              DISCHARGE SUMMARY   DISCHARGE DIAGNOSES: 1. T10 burst fractures. 2. Sternal fractures. 3. Acute and chronic pain. 4. Mild hyponatremia. 5. Acute blood loss anemia. 6. Anxiety disorder.  HISTORY OF PRESENT ILLNESS:  Mr. Anthony Hoover is a 46 year old male with history of hypertension, chronic low back pain who fell out of a tree on September 25, 2011, and sustained T10 burst fractures with disruption of vertebral body, nondisplaced T10 lamina fracture, and nondisplaced spinous fractures of T7 and T9.  He was also noted to have sternal fractures and multiple left rib fractures and pneumothorax with associated hemothorax.  The patient with history of alcohol abuse and was treated with CIWA protocol and beer.  PCA was discontinued, however, the patient continues to have issues with pain as well as constipation. He was evaluated by Rehab and we felt that he would benefit from a CIR program.  PAST MEDICAL HISTORY: 1. Hypertension. 2. Chronic back pain. 3. CVA in 2009. 4. Insomnia. 5. Anxiety disorder.  ALLERGIES:  No known drug allergies.  FUNCTIONAL HISTORY:  The patient was independent with ADLs and homemaking.  He is independent for transfers and mobility.  Does not use any assistive device.  Does still drive.  FUNCTIONAL STATUS:  The patient is min to mod assist for bed mobility. Min to mod assist transfers.  Min assist for ambulating 200 feet with rolling walker.  LABORATORY DATA:  A check of lytes from October 04, 2011, revealed sodium 136, potassium 3.4, chloride 99, CO2 26, BUN 11, creatinine 0.79, glucose 136.  CBC reveals hemoglobin 9.8, hematocrit 29.9, white count 10.7,  platelets 387.  HOSPITAL COURSE:  Mr. Anthony Hoover was admitted to rehab on October 01, 2011, for inpatient therapies to consist of PT, OT at least 3 hours 5 days a week.  Past admission, physiatrist, rehab RN, and therapy team have worked together to provide customized collaborative interdisciplinary care.  Rehab RN has worked with med management as well as bowel and bladder program.  The patient was noted to have issues with constipation and he was started on bowel program to help with this. Adequate pain control has been an issue.  His medicines were slowly titrated upward to methadone 30 mg q.8 hours in addition to OxyIR p.r.n. He was educated about pain management and time required for healing. He was also advised to use Robaxin to help with muscle spasms, and lidocaine patch was offered, however, the patient has declined this. The patient was noted to have mild hyponatremia at admission that had resolved by discharge.  He was noted to have mild hypokalemia and this was supplemented during the stay.  He needs followup check of lytes past this to monitor for resolution.  His blood pressures are noted to be better controlled by time of discharge.    During the patient's stay in rehab, weekly team conferences were  held to monitor the patient's progress, set goals, as well as discuss barriers to discharge.  Physical Therapy has worked with the patient on overall balance, activity tolerance as well as improving ability to compensate for deficits.  The patient was at modified independent level for transfers and mobility.  He is advised to use rolling walker most of the time but he could ambulate shorter distances safely without an assistive device.  OT has worked with the patient on self-care tasks.  The patient requires setup for ADLs, however, he is able to complete all tasks independently.  He requires assistance for donning TLSO.  Family education was done with mother and daughter with emphasis on  donning and doffing TLSO while supine.  No further followup home health therapies are recommended at this time.  The patient to follow up with Dr. Jeral Fruit for post hospital followup with further followup x-rays.  On October 05, 2011, the patient is discharged to home.  DISCHARGE MEDICATIONS: 1. Robaxin 500 mg 2 p.o. q.i.d. 2. Multivitamin 1 p.o. per day. 3. OxyIR 10 mg 1 p.o. q.i.d. p.r.n. pain, #90 Rx'ed. 4. Methadone 10 mg take 3 tablets p.o. q.8 hours, #270 Rx'ed. 5. Senokot-S 2 p.o. at bedtime. 6. Xanax 1 mg p.o. at bedtime p.r.n. anxiety. 7. Coated aspirin 81 mg a day. 8. Prevacid 30 mg a day. 9. Lisinopril/hydrochlorothiazide 20-12.5 per day.  DIET:  Regular.  ACTIVITY LEVEL:  Intermittent supervision assistance.  No lifting, driving, no strenuous activity until cleared by Dr. Jeral Fruit.  No alcohol.  SPECIAL INSTRUCTIONS:  Put on brace when flat in bed.  No driving until cleared by Dr. Jeral Fruit.  FOLLOWUP:  The patient to follow up with Dr. Janna Arch in the next few weeks for post hospital followup as well as for pain management issues. Also to have check of lytes and CBC for followup on his hypokalemia and acute blood loss anemia.  Follow up with Dr. Jeral Fruit for recheck in 2 weeks.  Follow up with Dr. Riley Kill as needed.     Delle Reining, P.A.   ______________________________ Ranelle Oyster, M.D.    PL/MEDQ  D:  10/08/2011  T:  10/08/2011  Job:  [id number]  cc:   Hilda Lias, M.D. Melvyn Novas, MD

## 2011-10-08 NOTE — Progress Notes (Signed)
Discharge summary # [phone]

## 2011-11-09 ENCOUNTER — Other Ambulatory Visit: Payer: Self-pay | Admitting: Neurosurgery

## 2011-11-09 DIAGNOSIS — T148XXA Other injury of unspecified body region, initial encounter: Secondary | ICD-10-CM

## 2011-11-13 ENCOUNTER — Ambulatory Visit
Admission: RE | Admit: 2011-11-13 | Discharge: 2011-11-13 | Disposition: A | Discharge status: Home / Self Care | Payer: Medicaid Other | Source: Ambulatory Visit | Attending: Neurosurgery | Admitting: Neurosurgery

## 2011-11-13 DIAGNOSIS — T148XXA Other injury of unspecified body region, initial encounter: Secondary | ICD-10-CM

## 2011-12-10 ENCOUNTER — Other Ambulatory Visit: Payer: Self-pay | Admitting: Neurosurgery

## 2011-12-10 DIAGNOSIS — M549 Dorsalgia, unspecified: Secondary | ICD-10-CM

## 2011-12-11 ENCOUNTER — Ambulatory Visit
Admission: RE | Admit: 2011-12-11 | Discharge: 2011-12-11 | Disposition: A | Discharge status: Home / Self Care | Payer: Medicaid Other | Source: Ambulatory Visit | Attending: Neurosurgery | Admitting: Neurosurgery

## 2011-12-11 DIAGNOSIS — M549 Dorsalgia, unspecified: Secondary | ICD-10-CM

## 2011-12-12 ENCOUNTER — Other Ambulatory Visit: Payer: Self-pay | Admitting: Neurosurgery

## 2011-12-12 DIAGNOSIS — IMO0002 Reserved for concepts with insufficient information to code with codable children: Secondary | ICD-10-CM

## 2011-12-12 DIAGNOSIS — M5384 Other specified dorsopathies, thoracic region: Secondary | ICD-10-CM

## 2011-12-31 ENCOUNTER — Ambulatory Visit
Admission: RE | Admit: 2011-12-31 | Discharge: 2011-12-31 | Disposition: A | Discharge status: Home / Self Care | Payer: Medicaid Other | Source: Ambulatory Visit | Attending: Neurosurgery | Admitting: Neurosurgery

## 2011-12-31 DIAGNOSIS — IMO0002 Reserved for concepts with insufficient information to code with codable children: Secondary | ICD-10-CM

## 2011-12-31 DIAGNOSIS — M5384 Other specified dorsopathies, thoracic region: Secondary | ICD-10-CM

## 2012-01-23 ENCOUNTER — Other Ambulatory Visit: Payer: Self-pay | Admitting: Neurosurgery

## 2012-01-23 DIAGNOSIS — M549 Dorsalgia, unspecified: Secondary | ICD-10-CM

## 2012-01-24 ENCOUNTER — Other Ambulatory Visit: Payer: Medicaid Other

## 2012-01-28 ENCOUNTER — Ambulatory Visit
Admission: RE | Admit: 2012-01-28 | Discharge: 2012-01-28 | Disposition: A | Discharge status: Home / Self Care | Payer: Medicaid Other | Source: Ambulatory Visit | Attending: Neurosurgery | Admitting: Neurosurgery

## 2012-01-28 ENCOUNTER — Other Ambulatory Visit: Payer: Medicaid Other

## 2012-01-28 DIAGNOSIS — M549 Dorsalgia, unspecified: Secondary | ICD-10-CM

## 2012-03-24 ENCOUNTER — Other Ambulatory Visit: Payer: Self-pay | Admitting: Neurosurgery

## 2012-03-24 DIAGNOSIS — S22009A Unspecified fracture of unspecified thoracic vertebra, initial encounter for closed fracture: Secondary | ICD-10-CM

## 2012-03-28 ENCOUNTER — Ambulatory Visit
Admission: RE | Admit: 2012-03-28 | Discharge: 2012-03-28 | Disposition: A | Discharge status: Home / Self Care | Payer: Medicaid Other | Source: Ambulatory Visit | Attending: Neurosurgery | Admitting: Neurosurgery

## 2012-03-28 DIAGNOSIS — S22009A Unspecified fracture of unspecified thoracic vertebra, initial encounter for closed fracture: Secondary | ICD-10-CM

## 2012-04-30 ENCOUNTER — Other Ambulatory Visit: Payer: Self-pay | Admitting: Neurosurgery

## 2012-04-30 DIAGNOSIS — T148XXA Other injury of unspecified body region, initial encounter: Secondary | ICD-10-CM

## 2012-05-07 ENCOUNTER — Ambulatory Visit
Admission: RE | Admit: 2012-05-07 | Discharge: 2012-05-07 | Disposition: A | Discharge status: Home / Self Care | Payer: Medicaid Other | Source: Ambulatory Visit | Attending: Neurosurgery | Admitting: Neurosurgery

## 2012-05-07 DIAGNOSIS — T148XXA Other injury of unspecified body region, initial encounter: Secondary | ICD-10-CM

## 2012-05-28 ENCOUNTER — Other Ambulatory Visit (HOSPITAL_COMMUNITY): Payer: Self-pay | Admitting: Family Medicine

## 2012-05-28 DIAGNOSIS — R1030 Lower abdominal pain, unspecified: Secondary | ICD-10-CM

## 2012-06-03 ENCOUNTER — Ambulatory Visit (HOSPITAL_COMMUNITY)
Admission: RE | Admit: 2012-06-03 | Discharge: 2012-06-03 | Disposition: A | Discharge status: Home / Self Care | Payer: Medicaid Other | Source: Ambulatory Visit | Attending: Family Medicine | Admitting: Family Medicine

## 2012-06-03 DIAGNOSIS — R1032 Left lower quadrant pain: Secondary | ICD-10-CM | POA: Insufficient documentation

## 2012-06-03 DIAGNOSIS — R1030 Lower abdominal pain, unspecified: Secondary | ICD-10-CM

## 2012-06-03 MED ORDER — IOHEXOL 300 MG/ML  SOLN
100.0000 mL | Freq: Once | INTRAMUSCULAR | Status: AC | PRN
  Administered 2012-06-03: 100 mL via INTRAVENOUS

## 2012-09-15 ENCOUNTER — Other Ambulatory Visit (HOSPITAL_COMMUNITY): Payer: Self-pay | Admitting: Family Medicine

## 2012-09-15 DIAGNOSIS — R1032 Left lower quadrant pain: Secondary | ICD-10-CM

## 2012-09-17 ENCOUNTER — Encounter (HOSPITAL_COMMUNITY): Payer: Self-pay

## 2012-09-17 ENCOUNTER — Ambulatory Visit (HOSPITAL_COMMUNITY)
Admission: RE | Admit: 2012-09-17 | Discharge: 2012-09-17 | Disposition: A | Discharge status: Home / Self Care | Payer: Medicaid Other | Source: Ambulatory Visit | Attending: Family Medicine | Admitting: Family Medicine

## 2012-09-17 DIAGNOSIS — R9389 Abnormal findings on diagnostic imaging of other specified body structures: Secondary | ICD-10-CM | POA: Insufficient documentation

## 2012-09-17 DIAGNOSIS — R1032 Left lower quadrant pain: Secondary | ICD-10-CM | POA: Insufficient documentation

## 2012-09-17 MED ORDER — IOHEXOL 300 MG/ML  SOLN
100.0000 mL | Freq: Once | INTRAMUSCULAR | Status: AC | PRN
  Administered 2012-09-17: 100 mL via INTRAVENOUS

## 2012-11-03 ENCOUNTER — Encounter (HOSPITAL_COMMUNITY): Payer: Self-pay | Admitting: Emergency Medicine

## 2012-11-03 ENCOUNTER — Emergency Department (HOSPITAL_COMMUNITY)
Admission: EM | Admit: 2012-11-03 | Discharge: 2012-11-03 | Discharge status: Against Medical Advice | Payer: Medicaid Other | Attending: Emergency Medicine | Admitting: Emergency Medicine

## 2012-11-03 DIAGNOSIS — E559 Vitamin D deficiency, unspecified: Secondary | ICD-10-CM | POA: Insufficient documentation

## 2012-11-03 NOTE — ED Notes (Signed)
Pt states that he would like to leave, states he does not want to wait any longer and that his daughter has to go to school tomorrow. Informed dr strand and she states that if he wants to sign out against medical advise he can, at this point the patient has not been seen by a physician. Pt is still bleeding but it is controlled when direct pressure is applied. Advised patient to stay here and wait for a physician to see him but he refused. Pt refuses further treatment and further screening.

## 2012-11-03 NOTE — ED Notes (Signed)
Pt states bleeding from small area of picked skin on his face, considerable amount of bleeding noted from the site, bleeding controlled with pressure, pt states he is on plavix.

## 2014-12-04 ENCOUNTER — Encounter (HOSPITAL_COMMUNITY): Payer: Self-pay | Admitting: *Deleted

## 2014-12-04 ENCOUNTER — Emergency Department (HOSPITAL_COMMUNITY)
Admission: EM | Admit: 2014-12-04 | Discharge: 2014-12-04 | Disposition: A | Discharge status: Home / Self Care | Payer: Medicaid Other | Attending: Emergency Medicine | Admitting: Emergency Medicine

## 2014-12-04 DIAGNOSIS — Z8669 Personal history of other diseases of the nervous system and sense organs: Secondary | ICD-10-CM | POA: Insufficient documentation

## 2014-12-04 DIAGNOSIS — F419 Anxiety disorder, unspecified: Secondary | ICD-10-CM | POA: Insufficient documentation

## 2014-12-04 DIAGNOSIS — Z8673 Personal history of transient ischemic attack (TIA), and cerebral infarction without residual deficits: Secondary | ICD-10-CM | POA: Insufficient documentation

## 2014-12-04 DIAGNOSIS — R2243 Localized swelling, mass and lump, lower limb, bilateral: Secondary | ICD-10-CM | POA: Insufficient documentation

## 2014-12-04 DIAGNOSIS — Z7982 Long term (current) use of aspirin: Secondary | ICD-10-CM | POA: Diagnosis not present

## 2014-12-04 DIAGNOSIS — M7989 Other specified soft tissue disorders: Secondary | ICD-10-CM

## 2014-12-04 DIAGNOSIS — Z79899 Other long term (current) drug therapy: Secondary | ICD-10-CM | POA: Insufficient documentation

## 2014-12-04 DIAGNOSIS — Z87891 Personal history of nicotine dependence: Secondary | ICD-10-CM | POA: Diagnosis not present

## 2014-12-04 DIAGNOSIS — I10 Essential (primary) hypertension: Secondary | ICD-10-CM | POA: Insufficient documentation

## 2014-12-04 LAB — CBC WITH DIFFERENTIAL/PLATELET
BASOS PCT: 0 % (ref 0–1)
Basophils Absolute: 0 10*3/uL (ref 0.0–0.1)
EOS ABS: 0.2 10*3/uL (ref 0.0–0.7)
Eosinophils Relative: 4 % (ref 0–5)
HCT: 33.4 % — ABNORMAL LOW (ref 39.0–52.0)
HEMOGLOBIN: 11.2 g/dL — AB (ref 13.0–17.0)
LYMPHS PCT: 24 % (ref 12–46)
Lymphs Abs: 1.3 10*3/uL (ref 0.7–4.0)
MCH: 33.7 pg (ref 26.0–34.0)
MCHC: 33.5 g/dL (ref 30.0–36.0)
MCV: 100.6 fL — ABNORMAL HIGH (ref 78.0–100.0)
Monocytes Absolute: 0.5 10*3/uL (ref 0.1–1.0)
Monocytes Relative: 9 % (ref 3–12)
NEUTROS PCT: 63 % (ref 43–77)
Neutro Abs: 3.4 10*3/uL (ref 1.7–7.7)
PLATELETS: 225 10*3/uL (ref 150–400)
RBC: 3.32 MIL/uL — AB (ref 4.22–5.81)
RDW: 12.6 % (ref 11.5–15.5)
WBC: 5.4 10*3/uL (ref 4.0–10.5)

## 2014-12-04 LAB — COMPREHENSIVE METABOLIC PANEL
ALT: 93 U/L — AB (ref 17–63)
AST: 128 U/L — ABNORMAL HIGH (ref 15–41)
Albumin: 3.9 g/dL (ref 3.5–5.0)
Alkaline Phosphatase: 113 U/L (ref 38–126)
Anion gap: 12 (ref 5–15)
BUN: 12 mg/dL (ref 6–20)
CHLORIDE: 100 mmol/L — AB (ref 101–111)
CO2: 24 mmol/L (ref 22–32)
Calcium: 9.1 mg/dL (ref 8.9–10.3)
Creatinine, Ser: 0.79 mg/dL (ref 0.61–1.24)
GFR calc non Af Amer: >60 mL/min (ref >60)
Glucose, Bld: 108 mg/dL — ABNORMAL HIGH (ref 65–99)
Potassium: 4 mmol/L (ref 3.5–5.1)
SODIUM: 136 mmol/L (ref 135–145)
Total Bilirubin: 0.6 mg/dL (ref 0.3–1.2)
Total Protein: 7.3 g/dL (ref 6.5–8.1)

## 2014-12-04 LAB — LIPASE, BLOOD: Lipase: 14 U/L — ABNORMAL LOW (ref 22–51)

## 2014-12-04 LAB — BRAIN NATRIURETIC PEPTIDE: B Natriuretic Peptide: 14 pg/mL (ref 0.0–100.0)

## 2014-12-04 MED ORDER — FUROSEMIDE 20 MG PO TABS: 20.0000 mg | ORAL_TABLET | Freq: Two times a day (BID) | ORAL | Status: DC

## 2014-12-04 NOTE — Discharge Instructions (Signed)
As discussed, today's evaluation for your lower extremity swelling has demonstrated the presence of liver dysfunction. This is likely due to alcohol use. Please drink alcohol only in moderation.  Please be sure to follow-up with your physician in 2 days to ensure improvement in your condition.  Return here for concerning changes.

## 2014-12-04 NOTE — ED Provider Notes (Signed)
CSN: [id number]     Arrival date & time 12/04/14  1736 History   First MD Initiated Contact with Patient 12/04/14 1752     Chief Complaint  Patient presents with  . Leg Swelling     HPI  Patient presents with concern of bilateral lower extremity swelling. This episode began about 3 days ago, has been persistent/increasing since onset. There is no focal pain, but there is generalized discomfort Patient also has mild ongoing dyspnea, but no chest pain, belly pain. Patient acknowledges a history of prior stroke, hypertension, alcohol dependency, and drinks about 15 beers each day. Since onset no clear alleviating or exacerbating factors.   Past Medical History  Diagnosis Date  . Hypertension   . Back pain     pain management by Dr. Janna ArchonDiego  . CVA (cerebral infarction)     2009  . Insomnia   . Anxiety disorder    Past Surgical History  Procedure Laterality Date  . Umbilical hernia repair     Family History  Problem Relation Age of Onset  . Diabetes Mother   . Cancer Father    History  Substance Use Topics  . Smoking status: Former Smoker -- 0.00 packs/day    Quit date: 12/04/2007  . Smokeless tobacco: Not on file  . Alcohol Use: 9.0 - 12.0 oz/week    15-20 Cans of beer per week    Review of Systems  Constitutional:       Per HPI, otherwise negative  HENT:       Per HPI, otherwise negative  Respiratory:       Per HPI, otherwise negative  Cardiovascular:       Per HPI, otherwise negative  Gastrointestinal: Negative for vomiting.  Endocrine:       Negative aside from HPI  Genitourinary:       Neg aside from HPI   Musculoskeletal:       Per HPI, otherwise negative  Skin: Negative.   Neurological: Negative for syncope.      Allergies  Review of patient's allergies indicates no known allergies.  Home Medications   Prior to Admission medications   Medication Sig Start Date End Date Taking? Authorizing Provider  ALPRAZolam Prudy Feeler(XANAX) 1 MG tablet Take 1 mg  by mouth at bedtime as needed.   Yes Historical Provider, MD  aspirin EC 81 MG tablet Take 81 mg by mouth at bedtime.    Yes Historical Provider, MD  buprenorphine (BUTRANS - DOSED MCG/HR) 20 MCG/HR PTWK patch Place 20 mcg onto the skin once a week.   Yes Historical Provider, MD  lansoprazole (PREVACID) 30 MG capsule Take 30 mg by mouth daily as needed. For acid reflux   Yes Historical Provider, MD  lisinopril-hydrochlorothiazide (PRINZIDE,ZESTORETIC) 20-12.5 MG per tablet Take 1 tablet by mouth daily.   Yes Historical Provider, MD  methadone (DOLOPHINE) 10 MG tablet Take 20 mg by mouth every 8 (eight) hours as needed for pain.   Yes Historical Provider, MD  pravastatin (PRAVACHOL) 10 MG tablet Take 10 mg by mouth daily.   Yes Historical Provider, MD  Sildenafil Citrate (VIAGRA PO) Take 0.5-1 tablets by mouth as needed. For intercourse   Yes Historical Provider, MD  lidocaine (LIDODERM) 5 % Place one patch on left ribs and one patch on sternum at 7am and remove at 7 pm daily. Patient not taking: Reported on 12/04/2014 10/05/11   Evlyn KannerPamela S Love, PA-C  Multiple Vitamin (MULITIVITAMIN WITH MINERALS) TABS Take 1 tablet by mouth daily.  Patient not taking: Reported on 12/04/2014 10/05/11   [name] Love, PA-C   BP 116/77 mmHg  Pulse 100  Temp(Src) 98.6 F (37 C) (Oral)  Resp 18  Ht  (1.778 m)  Wt 200 lb (90.719 kg)  BMI 28.70 kg/m2  SpO2 97% Physical Exam  Constitutional: He is oriented to person, place, and time. He appears well-developed. No distress.  HENT:  Head: Normocephalic and atraumatic.  Eyes: Conjunctivae and EOM are normal.  Cardiovascular: Normal rate and regular rhythm.   Pulmonary/Chest: Effort normal. No stridor. No respiratory distress.  Abdominal: He exhibits no distension.  Musculoskeletal: He exhibits edema.  Symmetric bilateral lower extremity edema minimal pitting  Neurological: He is alert and oriented to person, place, and time.  Skin: Skin is warm and dry.  Each of  the mid calves has circumferential erythema  Psychiatric: He has a normal mood and affect.  Nursing note and vitals reviewed.   ED Course  Procedures (including critical care time) Labs Review Labs Reviewed  COMPREHENSIVE METABOLIC PANEL - Abnormal; Notable for the following:    Chloride 100 (*)    Glucose, Bld 108 (*)    AST 128 (*)    ALT 93 (*)    All other components within normal limits  CBC WITH DIFFERENTIAL/PLATELET - Abnormal; Notable for the following:    RBC 3.32 (*)    Hemoglobin 11.2 (*)    HCT 33.4 (*)    MCV 100.6 (*)    All other components within normal limits  LIPASE, BLOOD - Abnormal; Notable for the following:    Lipase 14 (*)    All other components within normal limits  BRAIN NATRIURETIC PEPTIDE  URINALYSIS, ROUTINE W REFLEX MICROSCOPIC (NOT AT [facility])    Pulse ox 100% room air normal   On repeat exam patient is awake and alert. We discussed all findings, including elevated liver enzymes. Given the patient's description of substantial alcohol intake, his elevated liver enzymes, there is some suspicion for hepatic etiology for his edema. I discussed this with the patient, and we discussed decreasing alcohol use. Patient states that he will do so, is not interested in obtaining assistance.  Patient requested Lasix to facilitate fluid removal. Patient has no ongoing renal disease, and his request was accommodated, though he was encouraged to pursue alcohol addiction counseling as a better means to decrease edema.   MDM  Patient presents with concern of bilateral lower extremity edema. Though there is mild redness about each calf, the symmetry of the lesions, asymmetry of the size, the absence of fever, leukocytosis make cellulitis less likely. Patient's edema likely is hepatic in origin, given his history of substantial alcohol use for years. No evidence for renal failure, no physical exam evidence for heart failure. Patient was discharged in stable  condition with primary care follow-up, calling in 2 days for appointment in the next 5.   Gerhard Munch, MD 12/04/14 2028

## 2014-12-04 NOTE — ED Notes (Signed)
Pt states BLE swelling began Wednesday and he was unable to get in to see PCP. Lower legs are swollen, red, and tender to the touch. Left is worse. Pain behind left knee also.

## 2014-12-25 ENCOUNTER — Encounter (HOSPITAL_COMMUNITY): Payer: Self-pay

## 2014-12-25 ENCOUNTER — Emergency Department (HOSPITAL_COMMUNITY)
Admission: EM | Admit: 2014-12-25 | Discharge: 2014-12-25 | Disposition: A | Discharge status: Home / Self Care | Payer: Medicaid Other | Attending: Emergency Medicine | Admitting: Emergency Medicine

## 2014-12-25 ENCOUNTER — Emergency Department (HOSPITAL_COMMUNITY): Payer: Medicaid Other

## 2014-12-25 DIAGNOSIS — Z7982 Long term (current) use of aspirin: Secondary | ICD-10-CM | POA: Insufficient documentation

## 2014-12-25 DIAGNOSIS — I10 Essential (primary) hypertension: Secondary | ICD-10-CM | POA: Insufficient documentation

## 2014-12-25 DIAGNOSIS — R0789 Other chest pain: Secondary | ICD-10-CM | POA: Diagnosis not present

## 2014-12-25 DIAGNOSIS — F419 Anxiety disorder, unspecified: Secondary | ICD-10-CM | POA: Diagnosis not present

## 2014-12-25 DIAGNOSIS — Z8669 Personal history of other diseases of the nervous system and sense organs: Secondary | ICD-10-CM | POA: Diagnosis not present

## 2014-12-25 DIAGNOSIS — Z8673 Personal history of transient ischemic attack (TIA), and cerebral infarction without residual deficits: Secondary | ICD-10-CM | POA: Insufficient documentation

## 2014-12-25 DIAGNOSIS — Z8781 Personal history of (healed) traumatic fracture: Secondary | ICD-10-CM | POA: Insufficient documentation

## 2014-12-25 DIAGNOSIS — Z79899 Other long term (current) drug therapy: Secondary | ICD-10-CM | POA: Insufficient documentation

## 2014-12-25 DIAGNOSIS — Z87891 Personal history of nicotine dependence: Secondary | ICD-10-CM | POA: Insufficient documentation

## 2014-12-25 DIAGNOSIS — R079 Chest pain, unspecified: Secondary | ICD-10-CM | POA: Diagnosis present

## 2014-12-25 HISTORY — DX: Tobacco use: Z72.0

## 2014-12-25 HISTORY — DX: Reserved for concepts with insufficient information to code with codable children: IMO0002

## 2014-12-25 HISTORY — DX: Alcohol abuse, uncomplicated: F10.10

## 2014-12-25 LAB — CBC
HEMATOCRIT: 36.2 % — AB (ref 39.0–52.0)
Hemoglobin: 12.2 g/dL — ABNORMAL LOW (ref 13.0–17.0)
MCH: 33.1 pg (ref 26.0–34.0)
MCHC: 33.7 g/dL (ref 30.0–36.0)
MCV: 98.1 fL (ref 78.0–100.0)
PLATELETS: 258 10*3/uL (ref 150–400)
RBC: 3.69 MIL/uL — AB (ref 4.22–5.81)
RDW: 12.5 % (ref 11.5–15.5)
WBC: 6 10*3/uL (ref 4.0–10.5)

## 2014-12-25 LAB — BASIC METABOLIC PANEL
Anion gap: 17 — ABNORMAL HIGH (ref 5–15)
BUN: 16 mg/dL (ref 6–20)
CHLORIDE: 95 mmol/L — AB (ref 101–111)
CO2: 20 mmol/L — AB (ref 22–32)
CREATININE: 1.61 mg/dL — AB (ref 0.61–1.24)
Calcium: 9.1 mg/dL (ref 8.9–10.3)
GFR calc Af Amer: 56 mL/min — ABNORMAL LOW (ref >60)
GFR calc non Af Amer: 49 mL/min — ABNORMAL LOW (ref >60)
GLUCOSE: 108 mg/dL — AB (ref 65–99)
Potassium: 4.2 mmol/L (ref 3.5–5.1)
SODIUM: 132 mmol/L — AB (ref 135–145)

## 2014-12-25 LAB — I-STAT TROPONIN, ED: Troponin i, poc: 0 ng/mL (ref 0.00–0.08)

## 2014-12-25 LAB — D-DIMER, QUANTITATIVE (NOT AT ARMC)

## 2014-12-25 MED ORDER — METHOCARBAMOL 500 MG PO TABS: 1000.0000 mg | ORAL_TABLET | Freq: Four times a day (QID) | ORAL | Status: DC | PRN

## 2014-12-25 MED ORDER — SODIUM CHLORIDE 0.9 % IV BOLUS (SEPSIS)
1000.0000 mL | Freq: Once | INTRAVENOUS | Status: AC
  Administered 2014-12-25: 1000 mL via INTRAVENOUS

## 2014-12-25 MED ORDER — MORPHINE SULFATE 4 MG/ML IJ SOLN
4.0000 mg | INTRAMUSCULAR | Status: DC | PRN
  Administered 2014-12-25: 4 mg via INTRAVENOUS
  Filled 2014-12-25: qty 1

## 2014-12-25 NOTE — ED Notes (Signed)
Patient c/o of mid chest pain times one week

## 2014-12-25 NOTE — ED Provider Notes (Signed)
CSN: [id number]     Arrival date & time 12/25/14  1646 History   First MD Initiated Contact with Patient 12/25/14 1658     Chief Complaint  Patient presents with  . Chest Pain     HPI Pt was seen at 1735. Per pt, c/o gradual onset and persistence of constant left sided chest wall "pain" for the past 1 week, worse over the past 2 to 3 days. Pt describes the pain as "sore" and "constant." Pt has hx of multiple rib fractures, but denies new trauma. Denies palpitations, no SOB/cough, no abd pain, no N/V/D, no cough, no rash, no fevers.    Past Medical History  Diagnosis Date  . Hypertension   . Back pain     pain management by Dr. Janna Arch  . CVA (cerebral infarction)     2009  . Insomnia   . Anxiety disorder   . Compression fracture     T-spine  . Tobacco abuse   . Alcohol abuse    Past Surgical History  Procedure Laterality Date  . Umbilical hernia repair     Family History  Problem Relation Age of Onset  . Diabetes Mother   . Cancer Father    History  Substance Use Topics  . Smoking status: Former Smoker -- 0.00 packs/day    Quit date: 12/04/2007  . Smokeless tobacco: Not on file  . Alcohol Use: 9.0 - 12.0 oz/week    15-20 Cans of beer per week    Review of Systems ROS: Statement: All systems negative except as marked or noted in the HPI; Constitutional: Negative for fever and chills. ; ; Eyes: Negative for eye pain, redness and discharge. ; ; ENMT: Negative for ear pain, hoarseness, nasal congestion, sinus pressure and sore throat. ; ; Cardiovascular: Negative for palpitations, diaphoresis, dyspnea and peripheral edema. ; ; Respiratory: Negative for cough, wheezing and stridor. ; ; Gastrointestinal: Negative for nausea, vomiting, diarrhea, abdominal pain, blood in stool, hematemesis, jaundice and rectal bleeding. . ; ; Genitourinary: Negative for dysuria, flank pain and hematuria. ; ; Musculoskeletal: +chest pain. Negative for back pain and neck pain. Negative for  swelling and trauma.; ; Skin: Negative for pruritus, rash, abrasions, blisters, bruising and skin lesion.; ; Neuro: Negative for headache, lightheadedness and neck stiffness. Negative for weakness, altered level of consciousness , altered mental status, extremity weakness, paresthesias, involuntary movement, seizure and syncope.      Allergies  Review of patient's allergies indicates no known allergies.  Home Medications   Prior to Admission medications   Medication Sig Start Date End Date Taking? Authorizing Provider  ALPRAZolam Prudy Feeler) 1 MG tablet Take 1 mg by mouth at bedtime as needed.   Yes Historical Provider, MD  aspirin EC 81 MG tablet Take 81 mg by mouth at bedtime.    Yes Historical Provider, MD  furosemide (LASIX) 20 MG tablet Take 20 mg by mouth daily.   Yes Historical Provider, MD  lansoprazole (PREVACID) 30 MG capsule Take 30 mg by mouth daily as needed. For acid reflux   Yes Historical Provider, MD  lisinopril-hydrochlorothiazide (PRINZIDE,ZESTORETIC) 20-12.5 MG per tablet Take 1 tablet by mouth daily.   Yes Historical Provider, MD  methadone (DOLOPHINE) 10 MG tablet Take 20 mg by mouth every 8 (eight) hours as needed for pain.   Yes Historical Provider, MD  pravastatin (PRAVACHOL) 10 MG tablet Take 10 mg by mouth daily.   Yes Historical Provider, MD  buprenorphine (BUTRANS - DOSED MCG/HR) 20 MCG/HR PTWK  patch Place 20 mcg onto the skin as needed.     Historical Provider, MD  furosemide (LASIX) 20 MG tablet Take 1 tablet (20 mg total) by mouth 2 (two) times daily. 12/04/14 12/08/14  Gerhard Munch, MD  lidocaine (LIDODERM) 5 % Place one patch on left ribs and one patch on sternum at 7am and remove at 7 pm daily. Patient not taking: Reported on 12/04/2014 10/05/11   [name] Love, PA-C   BP 119/75 mmHg  Pulse 95  Temp(Src) 97.8 F (36.6 C) (Oral)  Resp 18  Ht 5\' 9"  (1.753 m)  Wt 195 lb (88.451 kg)  BMI 28.78 kg/m2  SpO2 100%  Filed Vitals:   12/25/14 1654 12/25/14 1900  BP:  128/79 119/75  Pulse: 104 95  Temp: 97.8 F (36.6 C)   TempSrc: Oral   Resp: 18 18  Height: 5\' 9"  (1.753 m)   Weight: 195 lb (88.451 kg)   SpO2: 99% 100%     Physical Exam  1740: Physical examination:  Nursing notes reviewed; Vital signs and O2 SAT reviewed;  Constitutional: Well developed, Well nourished, Well hydrated, In no acute distress; Head:  Normocephalic, atraumatic; Eyes: EOMI, PERRL, No scleral icterus; ENMT: Mouth and pharynx normal, Mucous membranes moist; Neck: Supple, Full range of motion, No lymphadenopathy; Cardiovascular: Tachycardic rate and rhythm, No murmur, rub, or gallop; Respiratory: Breath sounds clear & equal bilaterally, No rales, rhonchi, wheezes.  Speaking full sentences with ease, Normal respiratory effort/excursion; Chest: Nontender, No deformity, no rash, no soft tissue crepitus. Movement normal; Abdomen: Soft, Nontender, Nondistended, Normal bowel sounds; Genitourinary: No CVA tenderness; Extremities: Pulses normal, No tenderness, No edema, No calf edema or asymmetry.; Neuro: AA&Ox3, Major CN grossly intact.  Speech clear. No gross focal motor or sensory deficits in extremities.; Skin: Color normal, Warm, Dry.   ED Course  Procedures     EKG Interpretation   Date/Time:  Saturday December 25 2014 16:54:13 EDT Ventricular Rate:  107 PR Interval:  142 QRS Duration: 94 QT Interval:  340 QTC Calculation: 454 R Axis:   34 Text Interpretation:  Sinus tachycardia Baseline wander Artifact When  compared with ECG of 09/24/2011 No significant change was found Confirmed  by [facility]  MD, Nicholos Johns [phone]) on 12/25/2014 5:03:19 PM      MDM  MDM Reviewed: previous chart, nursing note and vitals Reviewed previous: labs and ECG Interpretation: labs, ECG and x-ray   Results for orders placed or performed during the hospital encounter of 12/25/14  CBC  Result Value Ref Range   WBC 6.0 4.0 - 10.5 K/uL   RBC 3.69 (L) 4.22 - 5.81 MIL/uL   Hemoglobin 12.2 (L) 13.0  - 17.0 g/dL   HCT 27.0 (L) 35.0 - 09.3 %   MCV 98.1 78.0 - 100.0 fL   MCH 33.1 26.0 - 34.0 pg   MCHC 33.7 30.0 - 36.0 g/dL   RDW 81.8 29.9 - 37.1 %   Platelets 258 150 - 400 K/uL  Basic metabolic panel  Result Value Ref Range   Sodium 132 (L) 135 - 145 mmol/L   Potassium 4.2 3.5 - 5.1 mmol/L   Chloride 95 (L) 101 - 111 mmol/L   CO2 20 (L) 22 - 32 mmol/L   Glucose, Bld 108 (H) 65 - 99 mg/dL   BUN 16 6 - 20 mg/dL   Creatinine, Ser 6.96 (H) 0.61 - 1.24 mg/dL   Calcium 9.1 8.9 - 78.9 mg/dL   GFR calc non Af Amer 49 (L) >60  mL/min   GFR calc Af Amer 56 (L) >60 mL/min   Anion gap 17 (H) 5 - 15  D-dimer, quantitative  Result Value Ref Range   D-Dimer, Quant <0.27 0.00 - 0.48 ug/mL-FEU  I-stat troponin, ED  (not at [facility])  Result Value Ref Range   Troponin i, poc 0.00 0.00 - 0.08 ng/mL   Comment 3           Dg Chest 2 View 12/25/2014   CLINICAL DATA:  Chest tightness/pain  EXAM: CHEST  2 VIEW  COMPARISON:  09/26/2011  FINDINGS: Mild scarring versus atelectasis at the left lung base. No focal consolidation. No pleural effusion or pneumothorax.  The heart is normal in size.  Multiple old bilateral posterior rib fractures.  IMPRESSION: No evidence of acute cardiopulmonary disease.   Electronically Signed   By: Charline Bills M.D.   On: 12/25/2014 17:54   1945:  IVF given for tachycardia and mildly elevated Cr from baseline. BP stable. Pain improved after meds and wants to go home now. Doubt PE as cause for symptoms with normal d-dimer and low risk [place].  Doubt ACS as cause for symptoms with normal troponin and unchanged EKG from previous after 1 week of constant symptoms. Tx symptomatically at this time. Pt already has multiple narcotic pain meds rx from his PMD.  Dx and testing d/w pt.  Questions answered.  Verb understanding, agreeable to d/c home with outpt f/u.       Samuel Jester, DO 12/28/14 2153

## 2014-12-25 NOTE — Discharge Instructions (Signed)
°Emergency Department Resource Guide °1) Find a Doctor and Pay Out of Pocket °Although you won't have to find out who is covered by your insurance plan, it is a good idea to ask around and get recommendations. You will then need to call the office and see if the doctor you have chosen will accept you as a new patient and what types of options they offer for patients who are self-pay. Some doctors offer discounts or will set up payment plans for their patients who do not have insurance, but you will need to ask so you aren't surprised when you get to your appointment. ° °2) Contact Your Local Health Department °Not all health departments have doctors that can see patients for sick visits, but many do, so it is worth a call to see if yours does. If you don't know where your local health department is, you can check in your phone book. The CDC also has a tool to help you locate your state's health department, and many state websites also have listings of all of their local health departments. ° °3) Find a Walk-in Clinic °If your illness is not likely to be very severe or complicated, you may want to try a walk in clinic. These are popping up all over the country in pharmacies, drugstores, and shopping centers. They're usually staffed by nurse practitioners or physician assistants that have been trained to treat common illnesses and complaints. They're usually fairly quick and inexpensive. However, if you have serious medical issues or chronic medical problems, these are probably not your best option. ° °No Primary Care Doctor: °- Call [facility] Connect at  832-8000 - they can help you locate a primary care doctor that  accepts your insurance, provides certain services, etc. °- Physician Referral Service- 1-800-533-3463 ° °Chronic Pain Problems: °Organization         Address  Phone   Notes  °[facility]  ([phone]) [phone] Patients need to be referred by their primary care doctor.  ° °Medication  Assistance: °Organization         Address  Phone   Notes  °[place] [facility] [address] °[place], [place] [address] (336) 641-8030 --Must be a resident of [place] °-- Must have NO insurance coverage whatsoever (no Medicaid/ Medicare, etc.) °-- The pt. MUST have a primary care doctor that directs their care regularly and follows them in the community °  °[facility]  (866) 331-1348   °[employer]  (888) 892-1162   ° °Agencies that provide inexpensive medical care: °Organization         Address  Phone   Notes  °[facility]  (336) 832-8035   °[facility]    (336) 832-7272   °[facility] [address] °[place], [place] [address] (336) 832-4777   °[facility] [address], °[place] (336) 271-4999   °[facility]    (336) 373-0678   °[facility]    (336) 272-1050   °[facility] ° [address], [place] Phone:  (336) 832-4444, Fax:  (336) 832-4440 Hours of Operation:  9 am - 6 pm, M-F.  Also accepts Medicaid/Medicare and self-pay.  °[facility] ° [address], [place] Phone: (336) [phone], Fax: (336) [phone]. Hours of Operation:  8:30 am - 5:30 pm, M-F.  Also accepts Medicaid and self-pay.  °[facility] [place] [address], [place] Phone: (336) 878-6027   °[facility] [address], [place], [place] (336)723-1848, Ext. 123 Mondays & Thursdays: 7-9 AM.  First 15 patients are seen on a first come, first serve basis. °  ° °Medicaid-accepting [place] Providers: ° °Organization         Address  Phone   Notes  °[facility] [address], [place] (336) 641-2100 Also accepts self-pay patients.  °[facility] [address], [place] ° (336) 856-9996   °[facility] [address], [place]  (336) 288-8857   °[facility] [address] [place] [address], [place] (336) 299-7000   °Veita Bland [address], [place]  ° (336) [phone] Only accepts [place] Access Medicaid patients after they have their name applied to their card.  ° °Self-Pay (no insurance) in [place]: ° °Organization         Address  Phone   Notes  °Sickle Cell Patients, [facility] [address], [place] (336) 832-1970   °[facility] [address], [place] (336) 832-4400   °[facility] ° [address], [place] (336) 992-4800   °[facility]/Dr. Osei-Bonsu ° [address] [place] [address], [place] or [address], [place] (336) [phone] Phone number for both [place] and [place] locations is the same.  °[facility] [address], [place] (336) 299-0000   °[facility] [address] [place] [address], [place] or [address] (336) 852-7530 °(336) 878-2260   °[facility] [address], [place] (336) 350-1642, phone; (336) 294-5005, fax Sees patients 1st and 3rd Saturday of every month.  Must not qualify for public or private insurance (i.e. Medicaid, Medicare, [place] Health Choice, Veterans' Benefits) • Household income should be no more than 200% of the poverty level •The clinic cannot treat you if you are pregnant or think you are pregnant • Sexually transmitted diseases are not treated at the clinic.  ° ° °Dental Care: °Organization         Address  Phone  Notes  °[place] [facility] [address], [place] (336) 641-6152 Accepts children up to age 21 who are enrolled in Medicaid or [place] Health Choice; pregnant women with a Medicaid card; and children who have applied for Medicaid or [place] Health Choice, but were declined, whose parents can pay a reduced fee at time of service.  °[place]  [facility] [place]  [address], [place] (336) 641-7733 Accepts children up to age 21 who are enrolled in Medicaid or [place] Health Choice; pregnant women with a Medicaid card; and children who have applied for Medicaid or [place] Health Choice, but were declined, whose parents can pay a reduced fee at time of service.  °[facility] ° [address], [place] (336) 641-4533 Patients are seen by appointment only. Walk-ins are not accepted. [facility] will see patients 18 years of age and older. °Monday - Tuesday (8am-5pm) °Most Wednesdays (8:30-5pm) °$30 per visit, cash only  °[facility] ° [address], [place] (336) [phone] Patients are seen by appointment only. Walk-ins are not accepted. [facility] will see patients 18 years of age and older. °One   Wednesday Evening (Monthly: Volunteer Based).  $30 per visit, cash only  °[facility]  (919) 537-3737 for adults; Children under age 4, call Graduate Pediatric Dentistry at ([phone]) [phone]. Children aged 4-14, please call ([phone]) [phone] to request a pediatric application. ° Dental services are provided in all areas of dental care including fillings, crowns and bridges, complete and partial dentures, implants, gum treatment, root canals, and extractions. Preventive care is also provided. Treatment is provided to both adults and children. °Patients are selected via a lottery and there is often a waiting list. °  °[facility] [address], °[place] ° (336) 763-8833 [email or web address] °  °[facility] [address], [place], [place] (336)723-1848, Ext. 123 Second and Fourth Thursday of each month, opens at 6:30 AM; Clinic ends at 9 AM.  Patients are seen on a first-come first-served basis, and a limited number are seen during each clinic.  ° °[facility] ° [address], [place], [place] (336) [phone]    Eligibility Requirements °You must have lived in [place], [place], or [place] counties for at least the last three months. °  You cannot be eligible for state or federal sponsored healthcare insurance, including Veterans Administration, Medicaid, or Medicare. °  You generally cannot be eligible for healthcare insurance through your employer.  °  How to apply: °Eligibility screenings are held every Tuesday and Wednesday afternoon from 1:00 pm until 4:00 pm. You do not need an appointment for the interview!  °[facility] [address], [place], [place] 336-631-2330   °[facility]  336-342-8273   °[facility]  336-703-3100   °[facility]  336-570-6415   ° °Behavioral Health Resources in the Community: °Intensive Outpatient Programs °Organization         Address  Phone  Notes  °[facility] [address], [place], [place] 336-878-6098   °[facility] [address], [place], [place] 336-832-9800   °[facility] [address], [place], [place] ° 336-882-2125   °[facility] [address],  °[place], [place] 1-800-853-5163 or 336-641-4981   °Substance Abuse Resources °Organization         Address  Phone  Notes  °[facility]  336-882-2125   °[facility]  336-784-9470   °[facility]  336-285-9073   °[facility]  336-845-3988   °Residential & Outpatient Substance Abuse Program  1-800-659-3381   °Psychological Services °Organization         Address  Phone  Notes  °[facility]  336- 832-9600   °[facility]  336- 378-7881   °[facility] [address], [place] 1-800-853-5163 or 336-641-4981   ° °Mobile Crisis Teams °Organization         Address  Phone  Notes  °[facility], Mobile Crisis Care Unit  1-877-626-1772   °Assertive °[facility] ° [address]  [place], [place] 336-834-9664   °Sharon DeEsch [address] °[place] 336-554-5454   ° °Self-Help/Support Groups °Organization         Address  Phone             Notes  °[facility]  - variety of support groups  336- 373-1402 Call for more information  °Narcotics Anonymous (NA), [facility] [address], °[place]  2 meetings at this location  ° °  Residential Treatment Programs Organization         Address  Phone  Notes  [facility] [address],    [place]  7-510-258-5277   [facility]  [address], [place] [address], [place], [place] [phone]   [facility] [address] [place], [place] 086-761-9509 Admissions: 8am-3pm M-F  [facility] [address],    [place], [place] 326-712-4580   [facility] [address] [place], [place], [place] 998-338-2505   [facility] [address]  [place], [place] 397-673-4193   [facility] - Intensive Outpatient [address], [place], [place] 790-240-9735   [facility] ([facility] [address]  [place], [place] 3-299-242-6834 or 949-596-2100   [facility] [address], [place], [place] 921-194-1740 Accepts Medicaid  [facility] [address]  [place] 8-144-818-5631 Substance Abuse/Addiction Treatment   [facility]         Address  Phone  Notes  [facility]  (208)170-6603   Angie Fava, PhD [address] [place], [place]   (380)153-9095 or 410-201-0673   [facility]   [address] [place], [place] 334-544-7978   [facility] [address], [place], [place] 531-285-4125 Insurance/Medicaid/sponsorship through [employer] [facility] [address]                                    [place], [place] (208)[phone] Therapy/tele-psych/case    [facility] [address], [place] [phone]    Dr. Lolly Mustache  386-074-6557   [facility]  [employer] [facility] Dept. 1) [address], [place] 2) [address], [place] 3)  [address], [place] 580-294-0851 (773)[phone]  951-455-7064   [facility] Child Abuse Hotline 4350837934 or 865-257-2962 (After Hours)      Take the prescription as directed. Continue to take your usual prescriptions as previously directed. Apply moist heat or ice to the area(s) of discomfort, for 15 minutes at a time, several times per day for the next few days.  Do not fall asleep on a heating or ice pack.  Call your regular medical doctor on Monday to schedule a follow up appointment in the next 3 days.  Return to the Emergency Department immediately if worsening.

## 2015-11-17 ENCOUNTER — Emergency Department (HOSPITAL_COMMUNITY)
Admission: EM | Admit: 2015-11-17 | Discharge: 2015-11-17 | Disposition: A | Discharge status: Home / Self Care | Payer: Medicare Other | Attending: Emergency Medicine | Admitting: Emergency Medicine

## 2015-11-17 ENCOUNTER — Emergency Department (HOSPITAL_COMMUNITY): Payer: Medicare Other

## 2015-11-17 ENCOUNTER — Encounter (HOSPITAL_COMMUNITY): Payer: Self-pay | Admitting: Emergency Medicine

## 2015-11-17 DIAGNOSIS — Z79899 Other long term (current) drug therapy: Secondary | ICD-10-CM | POA: Diagnosis not present

## 2015-11-17 DIAGNOSIS — R600 Localized edema: Secondary | ICD-10-CM | POA: Insufficient documentation

## 2015-11-17 DIAGNOSIS — I10 Essential (primary) hypertension: Secondary | ICD-10-CM | POA: Insufficient documentation

## 2015-11-17 DIAGNOSIS — M7989 Other specified soft tissue disorders: Secondary | ICD-10-CM | POA: Diagnosis present

## 2015-11-17 DIAGNOSIS — Z87891 Personal history of nicotine dependence: Secondary | ICD-10-CM | POA: Diagnosis not present

## 2015-11-17 DIAGNOSIS — Z7982 Long term (current) use of aspirin: Secondary | ICD-10-CM | POA: Diagnosis not present

## 2015-11-17 DIAGNOSIS — R609 Edema, unspecified: Secondary | ICD-10-CM

## 2015-11-17 LAB — CBC WITH DIFFERENTIAL/PLATELET
BASOS PCT: 0 %
Basophils Absolute: 0 10*3/uL (ref 0.0–0.1)
Eosinophils Absolute: 0.2 10*3/uL (ref 0.0–0.7)
Eosinophils Relative: 5 %
HEMATOCRIT: 34.6 % — AB (ref 39.0–52.0)
Hemoglobin: 11.4 g/dL — ABNORMAL LOW (ref 13.0–17.0)
Lymphocytes Relative: 28 %
Lymphs Abs: 1.4 10*3/uL (ref 0.7–4.0)
MCH: 33.5 pg (ref 26.0–34.0)
MCHC: 32.9 g/dL (ref 30.0–36.0)
MCV: 101.8 fL — ABNORMAL HIGH (ref 78.0–100.0)
MONO ABS: 0.7 10*3/uL (ref 0.1–1.0)
MONOS PCT: 14 %
NEUTROS ABS: 2.6 10*3/uL (ref 1.7–7.7)
Neutrophils Relative %: 53 %
Platelets: 214 10*3/uL (ref 150–400)
RBC: 3.4 MIL/uL — ABNORMAL LOW (ref 4.22–5.81)
RDW: 13 % (ref 11.5–15.5)
WBC: 4.9 10*3/uL (ref 4.0–10.5)

## 2015-11-17 LAB — COMPREHENSIVE METABOLIC PANEL
ALBUMIN: 3.5 g/dL (ref 3.5–5.0)
ALT: 119 U/L — ABNORMAL HIGH (ref 17–63)
ANION GAP: 11 (ref 5–15)
AST: 122 U/L — AB (ref 15–41)
Alkaline Phosphatase: 107 U/L (ref 38–126)
BILIRUBIN TOTAL: 0.4 mg/dL (ref 0.3–1.2)
BUN: 8 mg/dL (ref 6–20)
CHLORIDE: 102 mmol/L (ref 101–111)
CO2: 26 mmol/L (ref 22–32)
Calcium: 8.7 mg/dL — ABNORMAL LOW (ref 8.9–10.3)
Creatinine, Ser: 0.63 mg/dL (ref 0.61–1.24)
GFR calc Af Amer: >60 mL/min (ref >60)
GFR calc non Af Amer: >60 mL/min (ref >60)
GLUCOSE: 111 mg/dL — AB (ref 65–99)
POTASSIUM: 3.8 mmol/L (ref 3.5–5.1)
SODIUM: 139 mmol/L (ref 135–145)
TOTAL PROTEIN: 6.9 g/dL (ref 6.5–8.1)

## 2015-11-17 LAB — PROTIME-INR
INR: 1.07 (ref 0.00–1.49)
Prothrombin Time: 14.1 seconds (ref 11.6–15.2)

## 2015-11-17 LAB — BRAIN NATRIURETIC PEPTIDE: B NATRIURETIC PEPTIDE 5: 33 pg/mL (ref 0.0–100.0)

## 2015-11-17 LAB — ETHANOL: Alcohol, Ethyl (B): <5 mg/dL (ref <5)

## 2015-11-17 LAB — TROPONIN I

## 2015-11-17 LAB — D-DIMER, QUANTITATIVE: D-Dimer, Quant: 0.35 ug/mL-FEU (ref 0.00–0.50)

## 2015-11-17 MED ORDER — POTASSIUM CHLORIDE ER 10 MEQ PO TBCR: 10.0000 meq | EXTENDED_RELEASE_TABLET | Freq: Every day | ORAL | Status: DC

## 2015-11-17 MED ORDER — FUROSEMIDE 40 MG PO TABS: 40.0000 mg | ORAL_TABLET | Freq: Every day | ORAL | Status: DC

## 2015-11-17 NOTE — Discharge Instructions (Signed)

## 2015-11-17 NOTE — ED Provider Notes (Signed)
CSN: [id number]     Arrival date & time 11/17/15  1302 History   First MD Initiated Contact with Patient 11/17/15 1321     Chief Complaint  Patient presents with  . Leg Swelling   HPI Pt started having trouble with leg swelling a few days ago.  It is affecting both legs.  No drainage from the legs but he has noticed some redness.  He has had Increased salt intake.  He does have some shortness of breath with walking. No CP.  He saw his doctor last Thursday but he was not having the swelling.  No history of heart failure or DVT.  Patient does drink beer on a daily basis. He has taken diuretics in the past but is not on them currently. Past Medical History  Diagnosis Date  . Hypertension   . Back pain     pain management by Dr. Janna Arch  . CVA (cerebral infarction)     2009  . Insomnia   . Anxiety disorder   . Compression fracture     T-spine  . Tobacco abuse   . Alcohol abuse    Past Surgical History  Procedure Laterality Date  . Umbilical hernia repair     Family History  Problem Relation Age of Onset  . Diabetes Mother   . Cancer Father    Social History  Substance Use Topics  . Smoking status: Former Smoker -- 0.00 packs/day    Quit date: 12/04/2007  . Smokeless tobacco: None  . Alcohol Use: 9.0 - 12.0 oz/week    15-20 Cans of beer per week     Comment: >12 beers daily    Review of Systems  All other systems reviewed and are negative.     Allergies  Review of patient's allergies indicates no known allergies.  Home Medications   Prior to Admission medications   Medication Sig Start Date End Date Taking? Authorizing Provider  ALPRAZolam Prudy Feeler) 1 MG tablet Take 1 mg by mouth at bedtime.    Yes Historical Provider, MD  aspirin EC 81 MG tablet Take 81 mg by mouth at bedtime.    Yes Historical Provider, MD  clobetasol (TEMOVATE) 0.05 % external solution Apply 1 application topically as needed. 09/15/15  Yes Historical Provider, MD  lansoprazole (PREVACID) 30 MG  capsule Take 30 mg by mouth daily. For acid reflux   Yes Historical Provider, MD  lisinopril-hydrochlorothiazide (PRINZIDE,ZESTORETIC) 20-12.5 MG per tablet Take 1 tablet by mouth at bedtime.    Yes Historical Provider, MD  LORazepam (ATIVAN) 1 MG tablet Take 1 tablet by mouth daily. 10/13/15  Yes Historical Provider, MD  methadone (DOLOPHINE) 10 MG tablet Take 20 mg by mouth every 8 (eight) hours as needed for pain.   Yes Historical Provider, MD  methocarbamol (ROBAXIN) 500 MG tablet Take 2 tablets (1,000 mg total) by mouth 4 (four) times daily as needed for muscle spasms (muscle spasm/pain). Patient taking differently: Take 500 mg by mouth as needed for muscle spasms (muscle spasm/pain).  12/25/14  Yes Samuel Jester, DO  naproxen (NAPROSYN) 500 MG tablet Take 500 mg by mouth as needed for mild pain or moderate pain.  10/18/15  Yes Historical Provider, MD  tamsulosin (FLOMAX) 0.4 MG CAPS capsule Take 1 capsule by mouth daily. 10/18/15  Yes Historical Provider, MD  furosemide (LASIX) 40 MG tablet Take 1 tablet (40 mg total) by mouth daily. 11/17/15   Linwood Dibbles, MD  potassium chloride (K-DUR) 10 MEQ tablet Take 1 tablet (10  mEq total) by mouth daily. 11/17/15   Linwood DibblesJon Lauryl Seyer, MD   BP 143/89 mmHg  Pulse 98  Temp(Src) 97.9 F (36.6 C) (Oral)  Resp 18  Ht 5\' 9"  (1.753 m)  Wt 93.441 kg  BMI 30.41 kg/m2  SpO2 99% Physical Exam  Constitutional: He appears well-developed and well-nourished. No distress.  HENT:  Head: Normocephalic and atraumatic.  Right Ear: External ear normal.  Left Ear: External ear normal.  Eyes: Conjunctivae are normal. Right eye exhibits no discharge. Left eye exhibits no discharge. No scleral icterus.  Neck: Neck supple. No tracheal deviation present.  Cardiovascular: Normal rate, regular rhythm and intact distal pulses.   Pulmonary/Chest: Effort normal and breath sounds normal. No stridor. No respiratory distress. He has no wheezes. He has no rales.  Abdominal: Soft. Bowel  sounds are normal. He exhibits no distension. There is no tenderness. There is no rebound and no guarding.  Musculoskeletal: He exhibits edema. He exhibits no tenderness.  Bilateral pitting edema of the lower legs, ankles and feet, no increased warmth, no erythema suggesting cellulitis  Neurological: He is alert. He has normal strength. No cranial nerve deficit (no facial droop, extraocular movements intact, no slurred speech) or sensory deficit. He exhibits normal muscle tone. He displays no seizure activity. Coordination normal.  Skin: Skin is warm and dry. No rash noted.  Psychiatric: He has a normal mood and affect.  Nursing note and vitals reviewed.   ED Course  Procedures (including critical care time) Labs Review Labs Reviewed  COMPREHENSIVE METABOLIC PANEL - Abnormal; Notable for the following:    Glucose, Bld 111 (*)    Calcium 8.7 (*)    AST 122 (*)    ALT 119 (*)    All other components within normal limits  CBC WITH DIFFERENTIAL/PLATELET - Abnormal; Notable for the following:    RBC 3.40 (*)    Hemoglobin 11.4 (*)    HCT 34.6 (*)    MCV 101.8 (*)    All other components within normal limits  BRAIN NATRIURETIC PEPTIDE  ETHANOL  D-DIMER, QUANTITATIVE (NOT AT [facility])  TROPONIN I  PROTIME-INR    Imaging Review Dg Chest 2 View  11/17/2015  CLINICAL DATA:  50 year old male with a history of leg swelling and shallow breathing. History of smoking EXAM: CHEST  2 VIEW COMPARISON:  12/25/2014 FINDINGS: Cardiomediastinal silhouette unchanged in size and contour. Low lung volumes. Linear opacities at the bilateral bases, similar to the comparison study. No pleural effusion. No pneumothorax or confluent airspace disease. Configuration of the lower thoracic compression fractures unchanged from the prior CT 03/28/2012. No displaced acute fracture. Bilateral healed rib fractures. IMPRESSION: Chronic lung changes with linear scarring/ atelectasis at the bases, and no evidence of  superimposed acute cardiopulmonary disease. Signed, Yvone NeuJaime S. Loreta AveWagner, DO [facility] Electronically Signed   By: Gilmer MorJaime  Wagner D.O.   On: 11/17/2015 14:33   I have personally reviewed and evaluated these images and lab results as part of my medical decision-making.   EKG Interpretation   Date/Time:  Thursday Nov 17 2015 14:01:04 EDT Ventricular Rate:  92 PR Interval:  150 QRS Duration: 98 QT Interval:  379 QTC Calculation: 469 R Axis:   65 Text Interpretation:  Sinus rhythm No significant change since last  tracing Confirmed by Ludger Bones  MD-J, Mariacristina Aday ([id number]) on 11/17/2015 2:25:28 PM      MDM   Final diagnoses:  Peripheral edema    Patient presents to the emergency room  with complaints of peripheral edema.  He has a history of heavy alcohol consumption. I suspect his peripheral edema may be related to his alcohol consumption, poor nutrition and possibly a component of liver disease.  D-dimer is negative I doubt DVT. Renal function is normal.  discharge home with Lasix and K and we discussed compression stockings. I also recommended he decrease his alcohol consumption.    Linwood Dibbles, MD 11/17/15 [phone]

## 2015-11-17 NOTE — ED Notes (Addendum)
PT c/o bilateral leg and ankle swelling x2 days. PT states some SOB with exertion. PT states edema is better in the am and gradually worsens throughout the day. PT states he drinks >12 beers daily and he used to be on lasix but is no longer prescribed the medication. PT states he had blood drawn last Thursday at PCP office.

## 2016-05-12 ENCOUNTER — Inpatient Hospital Stay (HOSPITAL_COMMUNITY)
Admission: EM | Admit: 2016-05-12 | Discharge: 2016-05-17 | DRG: 101 | Disposition: A | Discharge status: Home / Self Care | Payer: Medicare Other | Attending: Family Medicine | Admitting: Family Medicine

## 2016-05-12 ENCOUNTER — Encounter (HOSPITAL_COMMUNITY): Payer: Self-pay | Admitting: Emergency Medicine

## 2016-05-12 ENCOUNTER — Emergency Department (HOSPITAL_COMMUNITY): Payer: Medicare Other

## 2016-05-12 DIAGNOSIS — Z79891 Long term (current) use of opiate analgesic: Secondary | ICD-10-CM

## 2016-05-12 DIAGNOSIS — F10231 Alcohol dependence with withdrawal delirium: Secondary | ICD-10-CM | POA: Diagnosis present

## 2016-05-12 DIAGNOSIS — E538 Deficiency of other specified B group vitamins: Secondary | ICD-10-CM | POA: Diagnosis present

## 2016-05-12 DIAGNOSIS — E669 Obesity, unspecified: Secondary | ICD-10-CM | POA: Diagnosis present

## 2016-05-12 DIAGNOSIS — F1010 Alcohol abuse, uncomplicated: Secondary | ICD-10-CM | POA: Diagnosis not present

## 2016-05-12 DIAGNOSIS — Z8673 Personal history of transient ischemic attack (TIA), and cerebral infarction without residual deficits: Secondary | ICD-10-CM

## 2016-05-12 DIAGNOSIS — Z79899 Other long term (current) drug therapy: Secondary | ICD-10-CM

## 2016-05-12 DIAGNOSIS — R569 Unspecified convulsions: Secondary | ICD-10-CM

## 2016-05-12 DIAGNOSIS — M549 Dorsalgia, unspecified: Secondary | ICD-10-CM

## 2016-05-12 DIAGNOSIS — I10 Essential (primary) hypertension: Secondary | ICD-10-CM | POA: Diagnosis present

## 2016-05-12 DIAGNOSIS — I151 Hypertension secondary to other renal disorders: Secondary | ICD-10-CM

## 2016-05-12 DIAGNOSIS — Z833 Family history of diabetes mellitus: Secondary | ICD-10-CM

## 2016-05-12 DIAGNOSIS — Z23 Encounter for immunization: Secondary | ICD-10-CM

## 2016-05-12 DIAGNOSIS — F10239 Alcohol dependence with withdrawal, unspecified: Secondary | ICD-10-CM

## 2016-05-12 DIAGNOSIS — F10230 Alcohol dependence with withdrawal, uncomplicated: Secondary | ICD-10-CM

## 2016-05-12 DIAGNOSIS — M545 Low back pain: Secondary | ICD-10-CM | POA: Diagnosis present

## 2016-05-12 DIAGNOSIS — Z720 Tobacco use: Secondary | ICD-10-CM

## 2016-05-12 DIAGNOSIS — M5440 Lumbago with sciatica, unspecified side: Secondary | ICD-10-CM

## 2016-05-12 DIAGNOSIS — J9811 Atelectasis: Secondary | ICD-10-CM | POA: Diagnosis present

## 2016-05-12 DIAGNOSIS — F419 Anxiety disorder, unspecified: Secondary | ICD-10-CM | POA: Diagnosis present

## 2016-05-12 DIAGNOSIS — G4089 Other seizures: Principal | ICD-10-CM | POA: Diagnosis present

## 2016-05-12 DIAGNOSIS — Z6832 Body mass index (BMI) 32.0-32.9, adult: Secondary | ICD-10-CM

## 2016-05-12 DIAGNOSIS — N2889 Other specified disorders of kidney and ureter: Secondary | ICD-10-CM

## 2016-05-12 DIAGNOSIS — G894 Chronic pain syndrome: Secondary | ICD-10-CM | POA: Diagnosis present

## 2016-05-12 DIAGNOSIS — Z87891 Personal history of nicotine dependence: Secondary | ICD-10-CM | POA: Diagnosis not present

## 2016-05-12 DIAGNOSIS — E876 Hypokalemia: Secondary | ICD-10-CM | POA: Diagnosis not present

## 2016-05-12 DIAGNOSIS — R509 Fever, unspecified: Secondary | ICD-10-CM

## 2016-05-12 DIAGNOSIS — D62 Acute posthemorrhagic anemia: Secondary | ICD-10-CM

## 2016-05-12 DIAGNOSIS — G8929 Other chronic pain: Secondary | ICD-10-CM | POA: Diagnosis present

## 2016-05-12 DIAGNOSIS — F10931 Alcohol use, unspecified with withdrawal delirium: Secondary | ICD-10-CM

## 2016-05-12 LAB — URINALYSIS, ROUTINE W REFLEX MICROSCOPIC
Bilirubin Urine: NEGATIVE
Glucose, UA: NEGATIVE mg/dL
Leukocytes, UA: NEGATIVE
Nitrite: NEGATIVE
Protein, ur: 100 mg/dL — AB
SPECIFIC GRAVITY, URINE: 1.025 (ref 1.005–1.030)
pH: 5.5 (ref 5.0–8.0)

## 2016-05-12 LAB — CBC WITH DIFFERENTIAL/PLATELET
Basophils Absolute: 0 10*3/uL (ref 0.0–0.1)
Basophils Relative: 1 %
EOS ABS: 0.1 10*3/uL (ref 0.0–0.7)
Eosinophils Relative: 1 %
HEMATOCRIT: 41.1 % (ref 39.0–52.0)
HEMOGLOBIN: 14.7 g/dL (ref 13.0–17.0)
LYMPHS ABS: 0.6 10*3/uL — AB (ref 0.7–4.0)
LYMPHS PCT: 9 %
MCH: 36.3 pg — AB (ref 26.0–34.0)
MCHC: 35.8 g/dL (ref 30.0–36.0)
MCV: 101.5 fL — ABNORMAL HIGH (ref 78.0–100.0)
Monocytes Absolute: 1 10*3/uL (ref 0.1–1.0)
Monocytes Relative: 14 %
NEUTROS ABS: 5.5 10*3/uL (ref 1.7–7.7)
NEUTROS PCT: 75 %
Platelets: 192 10*3/uL (ref 150–400)
RBC: 4.05 MIL/uL — AB (ref 4.22–5.81)
RDW: 13 % (ref 11.5–15.5)
WBC: 7.3 10*3/uL (ref 4.0–10.5)

## 2016-05-12 LAB — BASIC METABOLIC PANEL
ANION GAP: 17 — AB (ref 5–15)
BUN: 12 mg/dL (ref 6–20)
CHLORIDE: 101 mmol/L (ref 101–111)
CO2: 17 mmol/L — AB (ref 22–32)
Calcium: 9.8 mg/dL (ref 8.9–10.3)
Creatinine, Ser: 1.18 mg/dL (ref 0.61–1.24)
GFR calc Af Amer: >60 mL/min (ref >60)
GFR calc non Af Amer: >60 mL/min (ref >60)
GLUCOSE: 153 mg/dL — AB (ref 65–99)
POTASSIUM: 3.6 mmol/L (ref 3.5–5.1)
Sodium: 135 mmol/L (ref 135–145)

## 2016-05-12 LAB — MAGNESIUM: Magnesium: 2.3 mg/dL (ref 1.7–2.4)

## 2016-05-12 LAB — HEPATIC FUNCTION PANEL
ALBUMIN: 4.8 g/dL (ref 3.5–5.0)
ALT: <5 U/L — ABNORMAL LOW (ref 17–63)
AST: 128 U/L — ABNORMAL HIGH (ref 15–41)
Alkaline Phosphatase: 165 U/L — ABNORMAL HIGH (ref 38–126)
BILIRUBIN DIRECT: 0.1 mg/dL (ref 0.1–0.5)
BILIRUBIN TOTAL: 0.9 mg/dL (ref 0.3–1.2)
Indirect Bilirubin: 0.8 mg/dL (ref 0.3–0.9)
Total Protein: 9 g/dL — ABNORMAL HIGH (ref 6.5–8.1)

## 2016-05-12 LAB — RAPID URINE DRUG SCREEN, HOSP PERFORMED
AMPHETAMINES: NOT DETECTED
Barbiturates: NOT DETECTED
Benzodiazepines: NOT DETECTED
COCAINE: NOT DETECTED
OPIATES: POSITIVE — AB
TETRAHYDROCANNABINOL: POSITIVE — AB

## 2016-05-12 LAB — CBG MONITORING, ED: GLUCOSE-CAPILLARY: 173 mg/dL — AB (ref 65–99)

## 2016-05-12 LAB — URINE MICROSCOPIC-ADD ON

## 2016-05-12 LAB — ETHANOL: Alcohol, Ethyl (B): <5 mg/dL (ref <5)

## 2016-05-12 MED ORDER — LORAZEPAM 1 MG PO TABS
1.0000 mg | ORAL_TABLET | Freq: Four times a day (QID) | ORAL | Status: AC | PRN
  Administered 2016-05-13 – 2016-05-15 (×3): 1 mg via ORAL
  Filled 2016-05-12 (×3): qty 1

## 2016-05-12 MED ORDER — HYDROMORPHONE HCL 1 MG/ML IJ SOLN
INTRAMUSCULAR | Status: AC
  Filled 2016-05-12: qty 1

## 2016-05-12 MED ORDER — ADULT MULTIVITAMIN W/MINERALS CH
1.0000 | ORAL_TABLET | Freq: Every day | ORAL | Status: DC
  Administered 2016-05-12 – 2016-05-17 (×6): 1 via ORAL
  Filled 2016-05-12 (×6): qty 1

## 2016-05-12 MED ORDER — SODIUM CHLORIDE 0.9 % IV BOLUS (SEPSIS)
1000.0000 mL | Freq: Once | INTRAVENOUS | Status: AC
  Administered 2016-05-12: 1000 mL via INTRAVENOUS

## 2016-05-12 MED ORDER — FENTANYL CITRATE (PF) 100 MCG/2ML IJ SOLN
100.0000 ug | Freq: Once | INTRAMUSCULAR | Status: AC
Start: 2016-05-12 — End: 2016-05-12
  Administered 2016-05-12: 100 ug via INTRAVENOUS
  Filled 2016-05-12: qty 2

## 2016-05-12 MED ORDER — VITAMIN B-1 100 MG PO TABS
100.0000 mg | ORAL_TABLET | Freq: Every day | ORAL | Status: DC
  Administered 2016-05-12 – 2016-05-17 (×6): 100 mg via ORAL
  Filled 2016-05-12 (×6): qty 1

## 2016-05-12 MED ORDER — LORAZEPAM 2 MG/ML IJ SOLN
1.0000 mg | Freq: Once | INTRAMUSCULAR | Status: AC
  Administered 2016-05-12: 1 mg via INTRAVENOUS
  Filled 2016-05-12: qty 1

## 2016-05-12 MED ORDER — THIAMINE HCL 100 MG/ML IJ SOLN: 100.0000 mg | Freq: Every day | INTRAMUSCULAR | Status: DC

## 2016-05-12 MED ORDER — LORAZEPAM 2 MG/ML IJ SOLN
1.0000 mg | Freq: Four times a day (QID) | INTRAMUSCULAR | Status: AC | PRN
  Administered 2016-05-12 – 2016-05-14 (×3): 1 mg via INTRAVENOUS
  Filled 2016-05-12 (×3): qty 1

## 2016-05-12 MED ORDER — HYDROMORPHONE HCL 1 MG/ML IJ SOLN
1.0000 mg | Freq: Once | INTRAMUSCULAR | Status: AC
  Administered 2016-05-12: 1 mg via INTRAVENOUS
  Filled 2016-05-12: qty 1

## 2016-05-12 MED ORDER — FOLIC ACID 1 MG PO TABS
1.0000 mg | ORAL_TABLET | Freq: Every day | ORAL | Status: DC
  Administered 2016-05-12: 1 mg via ORAL
  Filled 2016-05-12: qty 1
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
## 2016-05-12 NOTE — ED Notes (Signed)
in the pyxis, there were new types of syringes that we normally don't have. This RN was giving this pt his dialudid 1 mg and the plunger pulled out of the vial after pt had only received 0.5 mg of dilaudid. This compurter did not have an appropriate answer as to where this medicine was wasted. Pt initially given 0.5mg  of dilaudid and another dilaudid was pulled and pt was only given 0.5 mg of dialudid out of that vial.
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
## 2016-05-12 NOTE — ED Provider Notes (Signed)
[facility]-EM[facility]Y DEPT Provider Note   CSN: [id number] Arrival date & time: 05/12/16  1545     History   Chief Complaint Chief Complaint  Patient presents with  . Seizures    HPI Anthony Hoover is a 50 y.o. male.   Seizures   This is a new problem. The current episode started less than 1 hour ago. The problem has been gradually improving. There was 1 seizure. Associated symptoms include confusion and visual disturbances. Pertinent negatives include no sleepiness and no chest pain. Characteristics include rhythmic jerking. The episode was witnessed. There was the sensation of an aura present. The seizures did not continue in the ED. Possible causes include change in alcohol use. Possible causes do not include medication or dosage change or recent illness. There has been no fever.    Past Medical History:  Diagnosis Date  . Alcohol abuse   . Anxiety disorder   . Back pain    pain management by Dr. Janna Arch  . Compression fracture    T-spine  . CVA (cerebral infarction)    2009  . Hypertension   . Insomnia   . Tobacco abuse     Patient Active Problem List   Diagnosis Date Noted  . Back pain   . CVA (cerebral infarction)   . Anxiety disorder   . Fall from tree 09/25/2011  . Multiple fractures of ribs of left side 09/25/2011  . Right pulmonary contusion 09/25/2011  . Hypertension 09/25/2011  . Chronic back pain 09/25/2011  . Wedge compression fracture of T8 vertebra (HCC) 09/25/2011  . Wedge compression fracture of T10 vertebra (HCC) 09/25/2011  . Sternal fracture 09/25/2011  . Fracture of transverse process of thoracic vertebra (HCC) 09/25/2011  . Heavy alcohol use 09/25/2011  . Acute blood loss anemia 09/25/2011  . History of CVA (cerebrovascular accident) 09/25/2011  . Tobacco use 09/25/2011    Past Surgical History:  Procedure Laterality Date  . UMBILICAL HERNIA REPAIR         Home Medications    Prior to Admission medications   Medication Sig Start  Date End Date Taking? Authorizing Provider  ALPRAZolam Prudy Feeler) 1 MG tablet Take 1 mg by mouth at bedtime.     Historical Provider, MD  aspirin EC 81 MG tablet Take 81 mg by mouth at bedtime.     Historical Provider, MD  clobetasol (TEMOVATE) 0.05 % external solution Apply 1 application topically as needed. 09/15/15   Historical Provider, MD  furosemide (LASIX) 40 MG tablet Take 1 tablet (40 mg total) by mouth daily. 11/17/15   Linwood Dibbles, MD  lansoprazole (PREVACID) 30 MG capsule Take 30 mg by mouth daily. For acid reflux    Historical Provider, MD  lisinopril-hydrochlorothiazide (PRINZIDE,ZESTORETIC) 20-12.5 MG per tablet Take 1 tablet by mouth at bedtime.     Historical Provider, MD  LORazepam (ATIVAN) 1 MG tablet Take 1 tablet by mouth daily. 10/13/15   Historical Provider, MD  methadone (DOLOPHINE) 10 MG tablet Take 20 mg by mouth every 8 (eight) hours as needed for pain.    Historical Provider, MD  methocarbamol (ROBAXIN) 500 MG tablet Take 2 tablets (1,000 mg total) by mouth 4 (four) times daily as needed for muscle spasms (muscle spasm/pain). Patient taking differently: Take 500 mg by mouth as needed for muscle spasms (muscle spasm/pain).  12/25/14   Samuel Jester, DO  naproxen (NAPROSYN) 500 MG tablet Take 500 mg by mouth as needed for mild pain or moderate pain.  10/18/15  Historical Provider, MD  potassium chloride (K-DUR) 10 MEQ tablet Take 1 tablet (10 mEq total) by mouth daily. 11/17/15   Linwood Dibbles, MD  tamsulosin (FLOMAX) 0.4 MG CAPS capsule Take 1 capsule by mouth daily. 10/18/15   Historical Provider, MD    Family History Family History  Problem Relation Age of Onset  . Diabetes Mother   . Cancer Father     Social History Social History  Substance Use Topics  . Smoking status: Former Smoker    Packs/day: 0.00    Quit date: 12/04/2007  . Smokeless tobacco: Never Used  . Alcohol use 9.0 - 12.0 oz/week    15 - 20 Cans of beer per week     Comment: >12 beers daily      Allergies   Patient has no known allergies.   Review of Systems Review of Systems  Constitutional: Negative for chills and fever.  Eyes: Positive for visual disturbance.  Cardiovascular: Negative for chest pain.  Neurological: Positive for seizures.  Psychiatric/Behavioral: Positive for confusion.  All other systems reviewed and are negative.    Physical Exam Updated Vital Signs BP 137/99 (BP Location: Left Arm)   Pulse (!) 130   Temp 97.8 F (36.6 C) (Oral)   Resp 20   Ht 5\' 9"  (1.753 m)   Wt 220 lb (99.8 kg)   SpO2 96%   BMI 32.49 kg/m   Physical Exam  Constitutional: He is oriented to person, place, and time. He appears well-developed and well-nourished.  tremulous  HENT:  Head: Normocephalic and atraumatic.  Eyes: Conjunctivae and EOM are normal.  Neck: Normal range of motion.  Cardiovascular: Tachycardia present.   Pulmonary/Chest: Effort normal. No respiratory distress. He has no rales.  Abdominal: Soft. He exhibits no distension. There is no tenderness.  Musculoskeletal: Normal range of motion. He exhibits no edema or deformity.  Neurological: He is alert and oriented to person, place, and time. No cranial nerve deficit.  Skin: Skin is warm and dry. No rash noted. No erythema.  Nursing note and vitals reviewed.    ED Treatments / Results  Labs (all labs ordered are listed, but only abnormal results are displayed) Labs Reviewed  BASIC METABOLIC PANEL - Abnormal; Notable for the following:       Result Value   CO2 17 (*)    Glucose, Bld 153 (*)    Anion gap 17 (*)    All other components within normal limits  CBC WITH DIFFERENTIAL/PLATELET - Abnormal; Notable for the following:    RBC 4.05 (*)    MCV 101.5 (*)    MCH 36.3 (*)    Lymphs Abs 0.6 (*)    All other components within normal limits  URINALYSIS, ROUTINE W REFLEX MICROSCOPIC (NOT AT [facility]) - Abnormal; Notable for the following:    Hgb urine dipstick SMALL (*)    Ketones, ur TRACE (*)     Protein, ur 100 (*)    All other components within normal limits  RAPID URINE DRUG SCREEN, HOSP PERFORMED - Abnormal; Notable for the following:    Opiates POSITIVE (*)    Tetrahydrocannabinol POSITIVE (*)    All other components within normal limits  HEPATIC FUNCTION PANEL - Abnormal; Notable for the following:    Total Protein 9.0 (*)    AST 128 (*)    ALT <5 (*)    Alkaline Phosphatase 165 (*)    All other components within normal limits  URINE MICROSCOPIC-ADD ON - Abnormal; Notable for  the following:    Squamous Epithelial / LPF 0-5 (*)    Bacteria, UA FEW (*)    All other components within normal limits  PHOSPHORUS - Abnormal; Notable for the following:    Phosphorus 2.3 (*)    All other components within normal limits  BASIC METABOLIC PANEL - Abnormal; Notable for the following:    Potassium 3.2 (*)    Glucose, Bld 122 (*)    Calcium 8.6 (*)    All other components within normal limits  CBC - Abnormal; Notable for the following:    RBC 3.67 (*)    Hemoglobin 12.4 (*)    HCT 37.4 (*)    MCV 101.9 (*)    All other components within normal limits  CBG MONITORING, ED - Abnormal; Notable for the following:    Glucose-Capillary 173 (*)    All other components within normal limits  MRSA PCR SCREENING  ETHANOL  MAGNESIUM  MAGNESIUM  TSH  VITAMIN B12    EKG  EKG Interpretation  Date/Time:  Saturday May 12 2016 15:51:08 EST Ventricular Rate:  132 PR Interval:    QRS Duration: 99 QT Interval:  312 QTC Calculation: 463 R Axis:   41 Text Interpretation:  Sinus tachycardia Confirmed by [facility] MD, Barbara Cower [phone]) on 05/12/2016 5:01:54 PM       Radiology Dg Thoracic Spine 4v  Result Date: 05/12/2016 CLINICAL DATA:  Headache seizure. EXAM: THORACIC SPINE - 4+ VIEW COMPARISON:  CT thoracic spine 05/07/2012. FINDINGS: Normal alignment of the thoracic vertebral bodies. Chronic compression fractures noted at T8 and T10 not changed from comparison CT. No acute loss  of vertebral body height and disc height. Normal paraspinal lines. IMPRESSION: 1. No acute findings of thoracic spine. 2. Chronic compression fractures at T8 and T10. Electronically Signed   By: Genevive Bi M.D.   On: 05/12/2016 17:34   Dg Lumbar Spine Complete  Result Date: 05/12/2016 CLINICAL DATA:  Acute onset of mid lower back pain. Initial encounter. EXAM: LUMBAR SPINE - COMPLETE 4+ VIEW COMPARISON:  None. FINDINGS: There is no evidence of fracture or subluxation. Loss of height at vertebral body T10 is likely chronic in nature, given the patient's history. Vertebral bodies otherwise demonstrate normal height and alignment. Intervertebral disc spaces are preserved. The visualized bowel gas pattern is unremarkable in appearance; air and stool are noted within the colon. The sacroiliac joints are within normal limits. IMPRESSION: 1. No evidence of acute fracture or subluxation along the lumbar spine. 2. Loss of height at vertebral body T10 is likely chronic in nature, given the patient's history of vertebral body fracture. Electronically Signed   By: Roanna Raider M.D.   On: 05/12/2016 17:38   Ct Head Wo Contrast  Result Date: 05/12/2016 CLINICAL DATA:  Acute onset of headache and altered mental status. Jerking movements, with postictal state. Initial encounter. EXAM: CT HEAD WITHOUT CONTRAST TECHNIQUE: Contiguous axial images were obtained from the base of the skull through the vertex without intravenous contrast. COMPARISON:  CT of the head performed 09/24/2011 FINDINGS: Brain: No evidence of acute infarction, hemorrhage, hydrocephalus, extra-axial collection or mass lesion/mass effect. Prominence of the sulci suggests mild cortical volume loss. Small chronic lacunar infarcts are noted at the right corona radiata and left basal ganglia. The brainstem and fourth ventricle are within normal limits. The cerebral hemispheres demonstrate grossly normal gray-white differentiation. No mass effect or  midline shift is seen. Vascular: No hyperdense vessel or unexpected calcification. Skull: There is no evidence of  fracture; visualized osseous structures are unremarkable in appearance. Sinuses/Orbits: The orbits are within normal limits. The paranasal sinuses and mastoid air cells are well-aerated. Other: No significant soft tissue abnormalities are seen. IMPRESSION: 1. No acute intracranial pathology seen on CT. 2. Mild cortical volume loss noted. 3. Small chronic lacunar infarcts at the right corona radiata and left basal ganglia. Electronically Signed   By: Roanna RaiderJeffery  Chang M.D.   On: 05/12/2016 17:40   Dg Chest Port 1 View  Result Date: 05/13/2016 CLINICAL DATA:  Fever.  Chronic back pain. EXAM: PORTABLE CHEST 1 VIEW COMPARISON:  11/17/2015 FINDINGS: Shallow inspiration with linear atelectasis in the lung bases. Heart size and pulmonary vascularity are normal for technique. Multiple old bilateral rib fractures. No focal airspace disease or consolidation in the lungs. No pneumothorax. IMPRESSION: Shallow inspiration with atelectasis in the lung bases. No evidence of active pulmonary disease. Electronically Signed   By: Burman NievesWilliam  Stevens M.D.   On: 05/13/2016 06:26    Procedures Procedures (including critical care time)  CRITICAL CARE Performed by: Marily MemosMesner, Gwendoline Judy Total critical care time: 35 minutes Critical care time was exclusive of separately billable procedures and treating other patients. Critical care was necessary to treat or prevent imminent or life-threatening deterioration. Critical care was time spent personally by me on the following activities: development of treatment plan with patient and/or surrogate as well as nursing, discussions with consultants, evaluation of patient's response to treatment, examination of patient, obtaining history from patient or surrogate, ordering and performing treatments and interventions, ordering and review of laboratory studies, ordering and review of  radiographic studies, pulse oximetry and re-evaluation of patient's condition.   Medications Ordered in ED Medications  LORazepam (ATIVAN) tablet 1 mg (1 mg Oral Given 05/13/16 0618)    Or  LORazepam (ATIVAN) injection 1 mg ( Intravenous See Alternative 05/13/16 0618)  thiamine (VITAMIN B-1) tablet 100 mg (100 mg Oral Given 05/13/16 0957)    Or  thiamine (B-1) injection 100 mg ( Intravenous See Alternative 05/13/16 0957)  multivitamin with minerals tablet 1 tablet (1 tablet Oral Given 05/13/16 0957)  tamsulosin (FLOMAX) capsule 0.4 mg (0.4 mg Oral Given 05/13/16 0957)  ALPRAZolam (XANAX) tablet 1 mg (1 mg Oral Given 05/13/16 0209)  enoxaparin (LOVENOX) injection 40 mg (40 mg Subcutaneous Given 05/13/16 0209)  sodium chloride flush (NS) 0.9 % injection 3 mL (3 mLs Intravenous Not Given 05/13/16 1000)  ondansetron (ZOFRAN) tablet 4 mg (not administered)    Or  ondansetron (ZOFRAN) injection 4 mg (not administered)  aspirin EC tablet 81 mg (81 mg Oral Given 05/13/16 0957)  Influenza vac split quadrivalent PF (FLUARIX) injection 0.5 mL (not administered)  pneumococcal 23 valent vaccine (PNU-IMMUNE) injection 0.5 mL (not administered)  feeding supplement (BOOST / RESOURCE BREEZE) liquid 1 Container (1 Container Oral Not Given 05/13/16 1000)  acetaminophen (TYLENOL) tablet 650 mg (650 mg Oral Given 05/13/16 0957)  pantoprazole (PROTONIX) EC tablet 40 mg (40 mg Oral Given 05/13/16 0957)  potassium chloride SA (K-DUR,KLOR-CON) CR tablet 20 mEq (20 mEq Oral Given 05/13/16 1011)  lisinopril (PRINIVIL,ZESTRIL) tablet 20 mg (20 mg Oral Given 05/13/16 1011)  methadone (DOLOPHINE) tablet 20 mg (20 mg Oral Given 05/13/16 1011)  sodium chloride 0.9 % bolus 1,000 mL (0 mLs Intravenous Stopped 05/12/16 2006)  fentaNYL (SUBLIMAZE) injection 100 mcg (100 mcg Intravenous Given 05/12/16 1655)  sodium chloride 0.9 % bolus 1,000 mL (0 mLs Intravenous Stopped 05/12/16 2153)  HYDROmorphone (DILAUDID) injection  1 mg (1 mg Intravenous Given 05/12/16 2007)  LORazepam (ATIVAN) injection 1 mg (1 mg Intravenous Given 05/12/16 2009)  sodium chloride 0.9 % 1,000 mL with thiamine 100 mg, folic acid 1 mg, multivitamins adult 10 mL infusion ( Intravenous New Bag/Given 05/13/16 0209)     Initial Impression / Assessment and Plan / ED Course  I have reviewed the triage vital signs and the nursing notes.  Pertinent labs & imaging results that were available during my care of the patient were reviewed by me and considered in my medical decision making (see chart for details).  Clinical Course     Concern for DT's secondary to seizure, HTN, tachycardia without obvious cause otherwise. Started on CIWA in ED and with fluids he did have some improvement in symptoms. No fever to suggest infectious cause. Nothing on CT to suggest anatomic cause, labs negative for metabolic cause. Plan for admission to SDU.   Final Clinical Impressions(s) / ED Diagnoses   Final diagnoses:  Seizure (HCC)  Alcohol withdrawal syndrome, with delirium [facility](HCC)    New Prescriptions New Prescriptions   No medications on file     Marily MemosJason Dalten Ambrosino, MD 05/13/16 1305

## 2016-05-13 ENCOUNTER — Inpatient Hospital Stay (HOSPITAL_COMMUNITY): Payer: Medicare Other

## 2016-05-13 LAB — CBC
HEMATOCRIT: 37.4 % — AB (ref 39.0–52.0)
Hemoglobin: 12.4 g/dL — ABNORMAL LOW (ref 13.0–17.0)
MCH: 33.8 pg (ref 26.0–34.0)
MCHC: 33.2 g/dL (ref 30.0–36.0)
MCV: 101.9 fL — AB (ref 78.0–100.0)
PLATELETS: 178 10*3/uL (ref 150–400)
RBC: 3.67 MIL/uL — ABNORMAL LOW (ref 4.22–5.81)
RDW: 12.9 % (ref 11.5–15.5)
WBC: 6.8 10*3/uL (ref 4.0–10.5)

## 2016-05-13 LAB — BASIC METABOLIC PANEL
Anion gap: 8 (ref 5–15)
BUN: 9 mg/dL (ref 6–20)
CHLORIDE: 105 mmol/L (ref 101–111)
CO2: 23 mmol/L (ref 22–32)
CREATININE: 0.88 mg/dL (ref 0.61–1.24)
Calcium: 8.6 mg/dL — ABNORMAL LOW (ref 8.9–10.3)
GFR calc Af Amer: >60 mL/min (ref >60)
GFR calc non Af Amer: >60 mL/min (ref >60)
GLUCOSE: 122 mg/dL — AB (ref 65–99)
POTASSIUM: 3.2 mmol/L — AB (ref 3.5–5.1)
Sodium: 136 mmol/L (ref 135–145)

## 2016-05-13 LAB — VITAMIN B12: VITAMIN B 12: 178 pg/mL — AB (ref 180–914)

## 2016-05-13 LAB — MAGNESIUM: Magnesium: 2.4 mg/dL (ref 1.7–2.4)

## 2016-05-13 LAB — PHOSPHORUS: Phosphorus: 2.3 mg/dL — ABNORMAL LOW (ref 2.5–4.6)

## 2016-05-13 LAB — MRSA PCR SCREENING: MRSA BY PCR: NEGATIVE

## 2016-05-13 LAB — TSH: TSH: 1.095 u[IU]/mL (ref 0.350–4.500)

## 2016-05-13 MED ORDER — SODIUM CHLORIDE 0.9% FLUSH
3.0000 mL | Freq: Two times a day (BID) | INTRAVENOUS | Status: DC
  Administered 2016-05-13 – 2016-05-17 (×6): 3 mL via INTRAVENOUS

## 2016-05-13 MED ORDER — ONDANSETRON HCL 4 MG/2ML IJ SOLN: 4.0000 mg | Freq: Four times a day (QID) | INTRAMUSCULAR | Status: DC | PRN

## 2016-05-13 MED ORDER — ACETAMINOPHEN 325 MG PO TABS
650.0000 mg | ORAL_TABLET | Freq: Four times a day (QID) | ORAL | Status: DC | PRN
  Administered 2016-05-13 – 2016-05-16 (×7): 650 mg via ORAL
  Filled 2016-05-13 (×7): qty 2

## 2016-05-13 MED ORDER — PANTOPRAZOLE SODIUM 40 MG PO TBEC
40.0000 mg | DELAYED_RELEASE_TABLET | Freq: Every day | ORAL | Status: DC
  Administered 2016-05-13 – 2016-05-17 (×5): 40 mg via ORAL
  Filled 2016-05-13 (×5): qty 1

## 2016-05-13 MED ORDER — PANTOPRAZOLE SODIUM 20 MG PO TBEC
20.0000 mg | DELAYED_RELEASE_TABLET | Freq: Every day | ORAL | Status: DC
  Filled 2016-05-13 (×2): qty 1

## 2016-05-13 MED ORDER — ONDANSETRON HCL 4 MG PO TABS: 4.0000 mg | ORAL_TABLET | Freq: Four times a day (QID) | ORAL | Status: DC | PRN

## 2016-05-13 MED ORDER — PNEUMOCOCCAL VAC POLYVALENT 25 MCG/0.5ML IJ INJ
0.5000 mL | INJECTION | INTRAMUSCULAR | Status: AC
  Administered 2016-05-14: 0.5 mL via INTRAMUSCULAR

## 2016-05-13 MED ORDER — METHADONE HCL 10 MG PO TABS
20.0000 mg | ORAL_TABLET | Freq: Three times a day (TID) | ORAL | Status: DC
  Administered 2016-05-13 – 2016-05-17 (×13): 20 mg via ORAL
  Filled 2016-05-13 (×13): qty 2

## 2016-05-13 MED ORDER — M.V.I. ADULT IV INJ
INJECTION | INTRAVENOUS | Status: AC
  Filled 2016-05-13: qty 10

## 2016-05-13 MED ORDER — THIAMINE HCL 100 MG/ML IJ SOLN: 100.0000 mg | Freq: Every day | INTRAMUSCULAR | Status: DC

## 2016-05-13 MED ORDER — ASPIRIN EC 81 MG PO TBEC
81.0000 mg | DELAYED_RELEASE_TABLET | Freq: Every day | ORAL | Status: DC
  Administered 2016-05-13 – 2016-05-17 (×5): 81 mg via ORAL
  Filled 2016-05-13 (×5): qty 1

## 2016-05-13 MED ORDER — ENOXAPARIN SODIUM 40 MG/0.4ML ~~LOC~~ SOLN
40.0000 mg | SUBCUTANEOUS | Status: DC
  Administered 2016-05-13 – 2016-05-17 (×5): 40 mg via SUBCUTANEOUS
  Filled 2016-05-13 (×5): qty 0.4

## 2016-05-13 MED ORDER — TAMSULOSIN HCL 0.4 MG PO CAPS
0.4000 mg | ORAL_CAPSULE | Freq: Every day | ORAL | Status: DC
  Administered 2016-05-13 – 2016-05-17 (×5): 0.4 mg via ORAL
  Filled 2016-05-13 (×5): qty 1

## 2016-05-13 MED ORDER — ALPRAZOLAM 1 MG PO TABS
1.0000 mg | ORAL_TABLET | Freq: Every day | ORAL | Status: DC
  Administered 2016-05-13 – 2016-05-16 (×5): 1 mg via ORAL
  Filled 2016-05-13: qty 1
  Filled 2016-05-13 (×3): qty 2
  Filled 2016-05-13: qty 1

## 2016-05-13 MED ORDER — FOLIC ACID 5 MG/ML IJ SOLN
INTRAMUSCULAR | Status: AC
  Filled 2016-05-13: qty 0.2

## 2016-05-13 MED ORDER — METHADONE HCL 10 MG PO TABS
10.0000 mg | ORAL_TABLET | Freq: Three times a day (TID) | ORAL | Status: DC
  Administered 2016-05-13: 10 mg via ORAL
  Filled 2016-05-13: qty 1

## 2016-05-13 MED ORDER — BOOST / RESOURCE BREEZE PO LIQD
1.0000 | Freq: Three times a day (TID) | ORAL | Status: DC
  Administered 2016-05-13: 1 via ORAL

## 2016-05-13 MED ORDER — INFLUENZA VAC SPLIT QUAD 0.5 ML IM SUSY
0.5000 mL | PREFILLED_SYRINGE | INTRAMUSCULAR | Status: AC
  Administered 2016-05-14: 0.5 mL via INTRAMUSCULAR

## 2016-05-13 MED ORDER — M.V.I. ADULT IV INJ
INJECTION | Freq: Once | INTRAVENOUS | Status: AC
  Administered 2016-05-13: 02:00:00 via INTRAVENOUS
  Filled 2016-05-13: qty 1000

## 2016-05-13 MED ORDER — THIAMINE HCL 100 MG/ML IJ SOLN
INTRAMUSCULAR | Status: AC
Start: 2016-05-13 — End: 2016-05-13
  Filled 2016-05-13: qty 2

## 2016-05-13 MED ORDER — LISINOPRIL 10 MG PO TABS
20.0000 mg | ORAL_TABLET | Freq: Every day | ORAL | Status: DC
  Administered 2016-05-13 – 2016-05-17 (×5): 20 mg via ORAL
  Filled 2016-05-13 (×5): qty 2

## 2016-05-13 MED ORDER — POTASSIUM CHLORIDE CRYS ER 20 MEQ PO TBCR
20.0000 meq | EXTENDED_RELEASE_TABLET | Freq: Three times a day (TID) | ORAL | Status: AC
  Administered 2016-05-13 – 2016-05-14 (×6): 20 meq via ORAL
  Filled 2016-05-13 (×6): qty 1

## 2016-05-13 NOTE — Plan of Care (Signed)
Problem: Pain Managment: Goal: General experience of comfort will improve Outcome: Not Progressing Pt states he has pain with movement to the lower and upper back. He explained he injured his back a few years ago by falling from a tree.  Problem: Physical Regulation: Goal: Ability to maintain clinical measurements within normal limits will improve Outcome: Not Progressing Pt has maintained a temperature. Current temp is 101.   Problem: Tissue Perfusion: Goal: Risk factors for ineffective tissue perfusion will decrease Outcome: Progressing Pt receiving Lovenox.

## 2016-05-13 NOTE — ED Notes (Signed)
Placed patient into yellow socks and fall risk bracelet

## 2016-05-13 NOTE — Progress Notes (Signed)
Subjective: He says he is feeling better. No headache. He has had a fever to 101. No further seizures have been noted.  Objective: Vital signs in last 24 hours: Vitals:   05/13/16 0400 05/13/16 0500 05/13/16 0600 05/13/16 0743  BP: (!) 152/91 (!) 154/101 (!) 156/101   Pulse: (!) 111 (!) 115 (!) 113   Resp: (!) 25 20 (!) 23   Temp: (!) 101 F (38.3 C)   (!) 100.8 F (38.2 C)  TempSrc: Oral   Oral  SpO2: 95% 96% 96%   Weight:  211 lb 13.8 oz (96.1 kg)    Height:       Weight change:   Intake/Output Summary (Last 24 hours) at 05/13/16 0933 Last data filed at 05/12/16 2153  Gross per 24 hour  Intake             2000 ml  Output                0 ml  Net             2000 ml    Physical Exam: Alert. No distress. Pharynx reveals tongue trauma. Neck supple. Lungs clear. Heart tachycardic. No murmurs. Abdomen nontender with no hepatosplenomegaly. Extremities reveal no edema. No rash.  Lab Results:    Results for orders placed or performed during the hospital encounter of 05/12/16 (from the past 24 hour(s))  CBC with Differential     Status: Abnormal   Collection Time: 05/12/16  4:07 PM  Result Value Ref Range   WBC 7.3 4.0 - 10.5 K/uL   RBC 4.05 (L) 4.22 - 5.81 MIL/uL   Hemoglobin 14.7 13.0 - 17.0 g/dL   HCT 40.9 81.1 - 91.4 %   MCV 101.5 (H) 78.0 - 100.0 fL   MCH 36.3 (H) 26.0 - 34.0 pg   MCHC 35.8 30.0 - 36.0 g/dL   RDW 78.2 95.6 - 21.3 %   Platelets 192 150 - 400 K/uL   Neutrophils Relative % 75 %   Neutro Abs 5.5 1.7 - 7.7 K/uL   Lymphocytes Relative 9 %   Lymphs Abs 0.6 (L) 0.7 - 4.0 K/uL   Monocytes Relative 14 %   Monocytes Absolute 1.0 0.1 - 1.0 K/uL   Eosinophils Relative 1 %   Eosinophils Absolute 0.1 0.0 - 0.7 K/uL   Basophils Relative 1 %   Basophils Absolute 0.0 0.0 - 0.1 K/uL  Hepatic function panel     Status: Abnormal   Collection Time: 05/12/16  4:07 PM  Result Value Ref Range   Total Protein 9.0 (H) 6.5 - 8.1 g/dL   Albumin 4.8 3.5 - 5.0 g/dL   AST  086 (H) 15 - 41 U/L   ALT <5 (L) 17 - 63 U/L   Alkaline Phosphatase 165 (H) 38 - 126 U/L   Total Bilirubin 0.9 0.3 - 1.2 mg/dL   Bilirubin, Direct 0.1 0.1 - 0.5 mg/dL   Indirect Bilirubin 0.8 0.3 - 0.9 mg/dL  Basic metabolic panel - if new onset seizures     Status: Abnormal   Collection Time: 05/12/16  4:08 PM  Result Value Ref Range   Sodium 135 135 - 145 mmol/L   Potassium 3.6 3.5 - 5.1 mmol/L   Chloride 101 101 - 111 mmol/L   CO2 17 (L) 22 - 32 mmol/L   Glucose, Bld 153 (H) 65 - 99 mg/dL   BUN 12 6 - 20 mg/dL   Creatinine, Ser 5.78 0.61 - 1.24 mg/dL  Calcium 9.8 8.9 - 10.3 mg/dL   GFR calc non Af Amer >60 >60 mL/min   GFR calc Af Amer >60 >60 mL/min   Anion gap 17 (H) 5 - 15  Ethanol     Status: None   Collection Time: 05/12/16  4:08 PM  Result Value Ref Range   Alcohol, Ethyl (B) <5 <5 mg/dL  Urinalysis, Routine w reflex microscopic (not [facility])     Status: Abnormal   Collection Time: 05/12/16  4:15 PM  Result Value Ref Range   Color, Urine YELLOW YELLOW   APPearance CLEAR CLEAR   Specific Gravity, Urine 1.025 1.005 - 1.030   pH 5.5 5.0 - 8.0   Glucose, UA NEGATIVE NEGATIVE mg/dL   Hgb urine dipstick SMALL (A) NEGATIVE   Bilirubin Urine NEGATIVE NEGATIVE   Ketones, ur TRACE (A) NEGATIVE mg/dL   Protein, ur 161100 (A) NEGATIVE mg/dL   Nitrite NEGATIVE NEGATIVE   Leukocytes, UA NEGATIVE NEGATIVE  Urine microscopic-add on     Status: Abnormal   Collection Time: 05/12/16  4:15 PM  Result Value Ref Range   Squamous Epithelial / LPF 0-5 (A) NONE SEEN   WBC, UA 0-5 0 - 5 WBC/hpf   RBC / HPF 6-30 0 - 5 RBC/hpf   Bacteria, UA FEW (A) NONE SEEN   Sperm, UA PRESENT   Rapid urine drug screen (hospital performed)     Status: Abnormal   Collection Time: 05/12/16  4:16 PM  Result Value Ref Range   Opiates POSITIVE (A) NONE DETECTED   Cocaine NONE DETECTED NONE DETECTED   Benzodiazepines NONE DETECTED NONE DETECTED   Amphetamines NONE DETECTED NONE DETECTED    Tetrahydrocannabinol POSITIVE (A) NONE DETECTED   Barbiturates NONE DETECTED NONE DETECTED  CBG monitoring, ED     Status: Abnormal   Collection Time: 05/12/16  5:39 PM  Result Value Ref Range   Glucose-Capillary 173 (H) 65 - 99 mg/dL  Magnesium     Status: None   Collection Time: 05/12/16  9:36 PM  Result Value Ref Range   Magnesium 2.3 1.7 - 2.4 mg/dL  MRSA PCR Screening     Status: None   Collection Time: 05/13/16  1:13 AM  Result Value Ref Range   MRSA by PCR NEGATIVE NEGATIVE  Magnesium     Status: None   Collection Time: 05/13/16  2:10 AM  Result Value Ref Range   Magnesium 2.4 1.7 - 2.4 mg/dL  Phosphorus     Status: Abnormal   Collection Time: 05/13/16  2:10 AM  Result Value Ref Range   Phosphorus 2.3 (L) 2.5 - 4.6 mg/dL  Basic metabolic panel     Status: Abnormal   Collection Time: 05/13/16  5:33 AM  Result Value Ref Range   Sodium 136 135 - 145 mmol/L   Potassium 3.2 (L) 3.5 - 5.1 mmol/L   Chloride 105 101 - 111 mmol/L   CO2 23 22 - 32 mmol/L   Glucose, Bld 122 (H) 65 - 99 mg/dL   BUN 9 6 - 20 mg/dL   Creatinine, Ser 0.960.88 0.61 - 1.24 mg/dL   Calcium 8.6 (L) 8.9 - 10.3 mg/dL   GFR calc non Af Amer >60 >60 mL/min   GFR calc Af Amer >60 >60 mL/min   Anion gap 8 5 - 15  CBC     Status: Abnormal   Collection Time: 05/13/16  5:33 AM  Result Value Ref Range   WBC 6.8 4.0 - 10.5 K/uL  RBC 3.67 (L) 4.22 - 5.81 MIL/uL   Hemoglobin 12.4 (L) 13.0 - 17.0 g/dL   HCT 16.1 (L) 09.6 - 04.5 %   MCV 101.9 (H) 78.0 - 100.0 fL   MCH 33.8 26.0 - 34.0 pg   MCHC 33.2 30.0 - 36.0 g/dL   RDW 40.9 81.1 - 91.4 %   Platelets 178 150 - 400 K/uL  TSH     Status: None   Collection Time: 05/13/16  5:33 AM  Result Value Ref Range   TSH 1.095 0.350 - 4.500 uIU/mL     ABGS No results for input(s): PHART, PO2ART, TCO2, HCO3 in the last 72 hours.  Invalid input(s): PCO2 CULTURES Recent Results (from the past 240 hour(s))  MRSA PCR Screening     Status: None   Collection Time:  05/13/16  1:13 AM  Result Value Ref Range Status   MRSA by PCR NEGATIVE NEGATIVE Final    Comment:        The GeneXpert MRSA Assay (FDA approved for NASAL specimens only), is one component of a comprehensive MRSA colonization surveillance program. It is not intended to diagnose MRSA infection nor to guide or monitor treatment for MRSA infections.    Studies/Results: Dg Thoracic Spine 4v  Result Date: 05/12/2016 CLINICAL DATA:  Headache seizure. EXAM: THORACIC SPINE - 4+ VIEW COMPARISON:  CT thoracic spine 05/07/2012. FINDINGS: Normal alignment of the thoracic vertebral bodies. Chronic compression fractures noted at T8 and T10 not changed from comparison CT. No acute loss of vertebral body height and disc height. Normal paraspinal lines. IMPRESSION: 1. No acute findings of thoracic spine. 2. Chronic compression fractures at T8 and T10. Electronically Signed   By: Genevive Bi M.D.   On: 05/12/2016 17:34   Dg Lumbar Spine Complete  Result Date: 05/12/2016 CLINICAL DATA:  Acute onset of mid lower back pain. Initial encounter. EXAM: LUMBAR SPINE - COMPLETE 4+ VIEW COMPARISON:  None. FINDINGS: There is no evidence of fracture or subluxation. Loss of height at vertebral body T10 is likely chronic in nature, given the patient's history. Vertebral bodies otherwise demonstrate normal height and alignment. Intervertebral disc spaces are preserved. The visualized bowel gas pattern is unremarkable in appearance; air and stool are noted within the colon. The sacroiliac joints are within normal limits. IMPRESSION: 1. No evidence of acute fracture or subluxation along the lumbar spine. 2. Loss of height at vertebral body T10 is likely chronic in nature, given the patient's history of vertebral body fracture. Electronically Signed   By: Roanna Raider M.D.   On: 05/12/2016 17:38   Ct Head Wo Contrast  Result Date: 05/12/2016 CLINICAL DATA:  Acute onset of headache and altered mental status. Jerking  movements, with postictal state. Initial encounter. EXAM: CT HEAD WITHOUT CONTRAST TECHNIQUE: Contiguous axial images were obtained from the base of the skull through the vertex without intravenous contrast. COMPARISON:  CT of the head performed 09/24/2011 FINDINGS: Brain: No evidence of acute infarction, hemorrhage, hydrocephalus, extra-axial collection or mass lesion/mass effect. Prominence of the sulci suggests mild cortical volume loss. Small chronic lacunar infarcts are noted at the right corona radiata and left basal ganglia. The brainstem and fourth ventricle are within normal limits. The cerebral hemispheres demonstrate grossly normal gray-white differentiation. No mass effect or midline shift is seen. Vascular: No hyperdense vessel or unexpected calcification. Skull: There is no evidence of fracture; visualized osseous structures are unremarkable in appearance. Sinuses/Orbits: The orbits are within normal limits. The paranasal sinuses and mastoid air cells  are well-aerated. Other: No significant soft tissue abnormalities are seen. IMPRESSION: 1. No acute intracranial pathology seen on CT. 2. Mild cortical volume loss noted. 3. Small chronic lacunar infarcts at the right corona radiata and left basal ganglia. Electronically Signed   By: Roanna Raider M.D.   On: 05/12/2016 17:40   Dg Chest Port 1 View  Result Date: 05/13/2016 CLINICAL DATA:  Fever.  Chronic back pain. EXAM: PORTABLE CHEST 1 VIEW COMPARISON:  11/17/2015 FINDINGS: Shallow inspiration with linear atelectasis in the lung bases. Heart size and pulmonary vascularity are normal for technique. Multiple old bilateral rib fractures. No focal airspace disease or consolidation in the lungs. No pneumothorax. IMPRESSION: Shallow inspiration with atelectasis in the lung bases. No evidence of active pulmonary disease. Electronically Signed   By: Burman Nieves M.D.   On: 05/13/2016 06:26   Micro Results: Recent Results (from the past 240 hour(s))   MRSA PCR Screening     Status: None   Collection Time: 05/13/16  1:13 AM  Result Value Ref Range Status   MRSA by PCR NEGATIVE NEGATIVE Final    Comment:        The GeneXpert MRSA Assay (FDA approved for NASAL specimens only), is one component of a comprehensive MRSA colonization surveillance program. It is not intended to diagnose MRSA infection nor to guide or monitor treatment for MRSA infections.    Studies/Results: Dg Thoracic Spine 4v  Result Date: 05/12/2016 CLINICAL DATA:  Headache seizure. EXAM: THORACIC SPINE - 4+ VIEW COMPARISON:  CT thoracic spine 05/07/2012. FINDINGS: Normal alignment of the thoracic vertebral bodies. Chronic compression fractures noted at T8 and T10 not changed from comparison CT. No acute loss of vertebral body height and disc height. Normal paraspinal lines. IMPRESSION: 1. No acute findings of thoracic spine. 2. Chronic compression fractures at T8 and T10. Electronically Signed   By: Genevive Bi M.D.   On: 05/12/2016 17:34   Dg Lumbar Spine Complete  Result Date: 05/12/2016 CLINICAL DATA:  Acute onset of mid lower back pain. Initial encounter. EXAM: LUMBAR SPINE - COMPLETE 4+ VIEW COMPARISON:  None. FINDINGS: There is no evidence of fracture or subluxation. Loss of height at vertebral body T10 is likely chronic in nature, given the patient's history. Vertebral bodies otherwise demonstrate normal height and alignment. Intervertebral disc spaces are preserved. The visualized bowel gas pattern is unremarkable in appearance; air and stool are noted within the colon. The sacroiliac joints are within normal limits. IMPRESSION: 1. No evidence of acute fracture or subluxation along the lumbar spine. 2. Loss of height at vertebral body T10 is likely chronic in nature, given the patient's history of vertebral body fracture. Electronically Signed   By: Roanna Raider M.D.   On: 05/12/2016 17:38   Ct Head Wo Contrast  Result Date: 05/12/2016 CLINICAL DATA:   Acute onset of headache and altered mental status. Jerking movements, with postictal state. Initial encounter. EXAM: CT HEAD WITHOUT CONTRAST TECHNIQUE: Contiguous axial images were obtained from the base of the skull through the vertex without intravenous contrast. COMPARISON:  CT of the head performed 09/24/2011 FINDINGS: Brain: No evidence of acute infarction, hemorrhage, hydrocephalus, extra-axial collection or mass lesion/mass effect. Prominence of the sulci suggests mild cortical volume loss. Small chronic lacunar infarcts are noted at the right corona radiata and left basal ganglia. The brainstem and fourth ventricle are within normal limits. The cerebral hemispheres demonstrate grossly normal gray-white differentiation. No mass effect or midline shift is seen. Vascular: No hyperdense vessel or  unexpected calcification. Skull: There is no evidence of fracture; visualized osseous structures are unremarkable in appearance. Sinuses/Orbits: The orbits are within normal limits. The paranasal sinuses and mastoid air cells are well-aerated. Other: No significant soft tissue abnormalities are seen. IMPRESSION: 1. No acute intracranial pathology seen on CT. 2. Mild cortical volume loss noted. 3. Small chronic lacunar infarcts at the right corona radiata and left basal ganglia. Electronically Signed   By: Roanna RaiderJeffery  Chang M.D.   On: 05/12/2016 17:40   Dg Chest Port 1 View  Result Date: 05/13/2016 CLINICAL DATA:  Fever.  Chronic back pain. EXAM: PORTABLE CHEST 1 VIEW COMPARISON:  11/17/2015 FINDINGS: Shallow inspiration with linear atelectasis in the lung bases. Heart size and pulmonary vascularity are normal for technique. Multiple old bilateral rib fractures. No focal airspace disease or consolidation in the lungs. No pneumothorax. IMPRESSION: Shallow inspiration with atelectasis in the lung bases. No evidence of active pulmonary disease. Electronically Signed   By: Burman NievesWilliam  Stevens M.D.   On: 05/13/2016 06:26    Medications:  I have reviewed the patient's current medications Scheduled Meds: . ALPRAZolam  1 mg Oral QHS  . aspirin EC  81 mg Oral Daily  . enoxaparin (LOVENOX) injection  40 mg Subcutaneous Q24H  . feeding supplement  1 Container Oral TID BM  . [START ON 05/14/2016] Influenza vac split quadrivalent PF  0.5 mL Intramuscular Tomorrow-1000  . methadone  10 mg Oral Q8H  . multivitamin with minerals  1 tablet Oral Daily  . pantoprazole  40 mg Oral Daily  . [START ON 05/14/2016] pneumococcal 23 valent vaccine  0.5 mL Intramuscular Tomorrow-1000  . potassium chloride  20 mEq Oral TID  . sodium chloride flush  3 mL Intravenous Q12H  . tamsulosin  0.4 mg Oral Daily  . thiamine  100 mg Oral Daily   Or  . thiamine  100 mg Intravenous Daily  . thiamine  100 mg Intravenous Daily   Continuous Infusions: PRN Meds:.acetaminophen, LORazepam **OR** LORazepam, ondansetron **OR** ondansetron (ZOFRAN) IV   Assessment/Plan: #1. Seizure. No previous history of seizures. He states he last had 2 beers one day prior to admission. Consult neurology. #2. Alcohol abuse. Continue detox regimen with Ativan, thiamine, folic acid and multivitamins. #3. Hypokalemia. 3.2. Supplement orally. #4. Hypertension with evidence of lacunar infarcts on head CT. #5. Chronic pain syndrome on methadone. #6. Fever. Chest x-ray reveals atelectasis with shallow inspiration. White count normal. Principal Problem:   Seizure due to alcohol withdrawal (HCC) Active Problems:   Chronic back pain   History of CVA (cerebrovascular accident)   Tobacco use   Anxiety disorder   Seizure (HCC)     LOS: 1 day   Anthony Hoover 05/13/2016, 9:33 [id number]

## 2016-05-13 NOTE — Progress Notes (Signed)
Notified MD, pt has a temp of 101, up from 100.3. Awaiting response.

## 2016-05-14 ENCOUNTER — Inpatient Hospital Stay (HOSPITAL_COMMUNITY)
Admit: 2016-05-14 | Discharge: 2016-05-14 | Disposition: A | Discharge status: Home / Self Care | Payer: Medicare Other | Attending: Internal Medicine | Admitting: Internal Medicine

## 2016-05-14 LAB — MAGNESIUM: MAGNESIUM: 1.8 mg/dL (ref 1.7–2.4)

## 2016-05-14 MED ORDER — CYANOCOBALAMIN 1000 MCG/ML IJ SOLN
1000.0000 ug | Freq: Once | INTRAMUSCULAR | Status: AC
  Administered 2016-05-14: 1000 ug via INTRAMUSCULAR
  Filled 2016-05-14: qty 1

## 2016-05-14 NOTE — Care Management Important Message (Signed)
Important Message  Patient Details  Name: Anthony Hoover MRN: [id number] Date of Birth: 04/18/1966   Medicare Important Message Given:  Yes    [name], [name], RN 05/14/2016, 2:58 PM
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
## 2016-05-14 NOTE — Progress Notes (Signed)
EEG Completed; Results Pending  
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
## 2016-05-14 NOTE — Care Management Note (Signed)
Case Management Note  Patient Details  Name: Anthony Hoover MRN: [id number] Date of Birth: 10/12/1965  Subjective/Objective:                  Pt admitted with ETOH related seizures. Chart reviewed for CM needs. Pt is from home, lives with family. Pt ind with ADL's. Has PCP, transportation and insurance with drug coverage. Pt plans to return home with self care at DC.   Action/Plan: No CM needs anticipated.   Expected Discharge Date:       05/16/2016           Expected Discharge Plan:  Home/Self Care  In-House Referral:  NA  Discharge planning Services  CM Consult  Post Acute Care Choice:  NA Choice offered to:  NA  Status of Service:  Completed, signed off   [name], Heyden Jaber Demske, RN 05/14/2016, 2:57 PM

## 2016-05-14 NOTE — Procedures (Signed)
  [facility] Mirian Casco A. Gerilyn Pilgrimoonquah, MD     [email or web address]           HISTORY: The patient presents with history of seizures. There is a history of alcoholism  MEDICATIONS: Scheduled Meds: . ALPRAZolam  1 mg Oral QHS  . aspirin EC  81 mg Oral Daily  . enoxaparin (LOVENOX) injection  40 mg Subcutaneous Q24H  . lisinopril  20 mg Oral Daily  . methadone  20 mg Oral Q8H  . multivitamin with minerals  1 tablet Oral Daily  . pantoprazole  40 mg Oral Daily  . sodium chloride flush  3 mL Intravenous Q12H  . tamsulosin  0.4 mg Oral Daily  . thiamine  100 mg Oral Daily   Or  . thiamine  100 mg Intravenous Daily   Continuous Infusions: PRN Meds:.acetaminophen, LORazepam **OR** LORazepam, ondansetron **OR** ondansetron (ZOFRAN) IV  Prior to Admission medications   Medication Sig Start Date End Date Taking? Authorizing Provider  ALPRAZolam Prudy Feeler(XANAX) 1 MG tablet Take 1 mg by mouth at bedtime.    Yes Historical Provider, MD  lansoprazole (PREVACID) 30 MG capsule Take 30 mg by mouth daily. For acid reflux   Yes Historical Provider, MD  lisinopril-hydrochlorothiazide (PRINZIDE,ZESTORETIC) 20-12.5 MG per tablet Take 1 tablet by mouth at bedtime.    Yes Historical Provider, MD  methadone (DOLOPHINE) 10 MG tablet Take 20 mg by mouth every 8 (eight) hours as needed for pain.   Yes Historical Provider, MD  tamsulosin (FLOMAX) 0.4 MG CAPS capsule Take 1 capsule by mouth daily. 10/18/15  Yes Historical Provider, MD      ANALYSIS: A 16 channel recording using standard 10 20 measurements is conducted for 22 minutes. There is a well-formed posterior dominant rhythm of 8/2 Hz which attenuates with eye opening. There is beta activity observed in the frontal areas. Awake and drowsy activities are observed. Photic stimulation and hyperventilation are not conducted. There is no focal or lateral slowing. There is no epileptiform activity observed.   IMPRESSION: This is a normal recording of the  awake and drowsy states.      Marna Weniger A. Gerilyn Pilgrimoonquah, M.D.  Diplomate, Biomedical engineerAmerican Board of Psychiatry and Neurology ( Neurology).

## 2016-05-14 NOTE — Progress Notes (Signed)
Presumed ethanol withdrawal seizures. EtOH level less than 5. CT scan no gross abnormalities of the cranium. EEG pending. Neurology consult requested Anthony Hoover [id number] DOB: 09/09/1965 DOA: 05/12/2016 PCP: Isabella StallingNDIEGO,[name] M, MD   Physical Exam: Blood pressure (!) 153/100, pulse (!) 133, temperature 98.6 F (37 C), resp. rate 20, height 5\' 9"  (1.753 m), weight 99.7 kg (219 lb 12.8 oz), SpO2 92 %. Lungs show prolonged respiratory phase no rales wheeze rhonchi appreciable heart regular rhythm no murmurs gallops or rubs abdomen protuberant bowel sounds normoactive no guarding or rebound neurologic cranial nerves grossly intact patient moves all 4 extremities plantars are downgoing   Investigations:  Recent Results (from the past 240 hour(s))  MRSA PCR Screening     Status: None   Collection Time: 05/13/16  1:13 AM  Result Value Ref Range Status   MRSA by PCR NEGATIVE NEGATIVE Final    Comment:        The GeneXpert MRSA Assay (FDA approved for NASAL specimens only), is one component of a comprehensive MRSA colonization surveillance program. It is not intended to diagnose MRSA infection nor to guide or monitor treatment for MRSA infections.      Basic Metabolic Panel:  Recent Labs  78/46/9610/05/18 1608 05/12/16 2136 05/13/16 0210 05/13/16 0533  NA 135  --   --  136  K 3.6  --   --  3.2*  CL 101  --   --  105  CO2 17*  --   --  23  GLUCOSE 153*  --   --  122*  BUN 12  --   --  9  CREATININE 1.18  --   --  0.88  CALCIUM 9.8  --   --  8.6*  MG  --  2.3 2.4  --   PHOS  --   --  2.3*  --    Liver Function Tests:  Recent Labs  05/12/16 1607  AST 128*  ALT <5*  ALKPHOS 165*  BILITOT 0.9  PROT 9.0*  ALBUMIN 4.8     CBC:  Recent Labs  05/12/16 1607 05/13/16 0533  WBC 7.3 6.8  NEUTROABS 5.5  --   HGB 14.7 12.4*  HCT 41.1 37.4*  MCV 101.5* 101.9*  PLT 192 178    Dg Thoracic Spine 4v  Result Date: 05/12/2016 CLINICAL DATA:  Headache seizure. EXAM:  THORACIC SPINE - 4+ VIEW COMPARISON:  CT thoracic spine 05/07/2012. FINDINGS: Normal alignment of the thoracic vertebral bodies. Chronic compression fractures noted at T8 and T10 not changed from comparison CT. No acute loss of vertebral body height and disc height. Normal paraspinal lines. IMPRESSION: 1. No acute findings of thoracic spine. 2. Chronic compression fractures at T8 and T10. Electronically Signed   By: Genevive BiStewart  Edmunds M.D.   On: 05/12/2016 17:34   Dg Lumbar Spine Complete  Result Date: 05/12/2016 CLINICAL DATA:  Acute onset of mid lower back pain. Initial encounter. EXAM: LUMBAR SPINE - COMPLETE 4+ VIEW COMPARISON:  None. FINDINGS: There is no evidence of fracture or subluxation. Loss of height at vertebral body T10 is likely chronic in nature, given the patient's history. Vertebral bodies otherwise demonstrate normal height and alignment. Intervertebral disc spaces are preserved. The visualized bowel gas pattern is unremarkable in appearance; air and stool are noted within the colon. The sacroiliac joints are within normal limits. IMPRESSION: 1. No evidence of acute fracture or subluxation along the lumbar spine. 2. Loss of height at vertebral body T10 is likely chronic  in nature, given the patient's history of vertebral body fracture. Electronically Signed   By: Roanna RaiderJeffery  Chang M.D.   On: 05/12/2016 17:38   Ct Head Wo Contrast  Result Date: 05/12/2016 CLINICAL DATA:  Acute onset of headache and altered mental status. Jerking movements, with postictal state. Initial encounter. EXAM: CT HEAD WITHOUT CONTRAST TECHNIQUE: Contiguous axial images were obtained from the base of the skull through the vertex without intravenous contrast. COMPARISON:  CT of the head performed 09/24/2011 FINDINGS: Brain: No evidence of acute infarction, hemorrhage, hydrocephalus, extra-axial collection or mass lesion/mass effect. Prominence of the sulci suggests mild cortical volume loss. Small chronic lacunar  infarcts are noted at the right corona radiata and left basal ganglia. The brainstem and fourth ventricle are within normal limits. The cerebral hemispheres demonstrate grossly normal gray-white differentiation. No mass effect or midline shift is seen. Vascular: No hyperdense vessel or unexpected calcification. Skull: There is no evidence of fracture; visualized osseous structures are unremarkable in appearance. Sinuses/Orbits: The orbits are within normal limits. The paranasal sinuses and mastoid air cells are well-aerated. Other: No significant soft tissue abnormalities are seen. IMPRESSION: 1. No acute intracranial pathology seen on CT. 2. Mild cortical volume loss noted. 3. Small chronic lacunar infarcts at the right corona radiata and left basal ganglia. Electronically Signed   By: Roanna RaiderJeffery  Chang M.D.   On: 05/12/2016 17:40   Dg Chest Port 1 View  Result Date: 05/13/2016 CLINICAL DATA:  Fever.  Chronic back pain. EXAM: PORTABLE CHEST 1 VIEW COMPARISON:  11/17/2015 FINDINGS: Shallow inspiration with linear atelectasis in the lung bases. Heart size and pulmonary vascularity are normal for technique. Multiple old bilateral rib fractures. No focal airspace disease or consolidation in the lungs. No pneumothorax. IMPRESSION: Shallow inspiration with atelectasis in the lung bases. No evidence of active pulmonary disease. Electronically Signed   By: Burman NievesWilliam  Stevens M.D.   On: 05/13/2016 06:26      Medications:   Impression:  Principal Problem:   Seizure due to alcohol withdrawal (HCC) Active Problems:   Chronic back pain   History of CVA (cerebrovascular accident)   Tobacco use   Anxiety disorder   Seizure (HCC)     Plan: Continue CIWA protocol. EEG pending. Neurochecks continue. Check serum magnesium and LFTs. Neurology consult requested  Consultants: Neurology requested   Procedures EEG   Antibiotics:          Time spent: 30 minutes   LOS: 2 days   [name]  M   05/14/2016, 12:16 PM

## 2016-05-15 MED ORDER — CYANOCOBALAMIN 1000 MCG/ML IJ SOLN
1000.0000 ug | Freq: Every day | INTRAMUSCULAR | Status: AC
  Administered 2016-05-15 – 2016-05-17 (×3): 1000 ug via INTRAMUSCULAR
  Filled 2016-05-15 (×3): qty 1

## 2016-05-15 MED ORDER — DEXTROSE 5 % IV SOLN
2.0000 g | INTRAVENOUS | Status: DC
  Administered 2016-05-15 – 2016-05-17 (×3): 2 g via INTRAVENOUS
  Filled 2016-05-15 (×4): qty 2

## 2016-05-15 MED ORDER — POTASSIUM CHLORIDE CRYS ER 20 MEQ PO TBCR
20.0000 meq | EXTENDED_RELEASE_TABLET | Freq: Two times a day (BID) | ORAL | Status: DC
  Administered 2016-05-15 – 2016-05-17 (×5): 20 meq via ORAL
  Filled 2016-05-15 (×5): qty 1

## 2016-05-15 MED ORDER — DEXTROSE 5 % IV SOLN
INTRAVENOUS | Status: AC
  Filled 2016-05-15: qty 10

## 2016-05-15 NOTE — Progress Notes (Signed)
Appreciate neurology consultation EEG reveals no deformity activity he 12 mildly deficient will replace with thousand micrograms subcutaneous daily for 3 days likewise potassium 20 mEq by mouth twice a day ordered patient has fever placed empirically on Rocephin blood cultures ordered urine analysis clear Anthony Hoover [id number] DOB: 09/22/1965 DOA: 05/12/2016 PCP: Isabella StallingNDIEGO,Tariq Pernell M, MD   Physical Exam: Blood pressure 132/89, pulse (!) 110, temperature (!) 101.5 F (38.6 C), temperature source Oral, resp. rate 15, height 5\' 9"  (1.753 m), weight 99.8 kg (220 lb 0.3 oz), SpO2 97 %. Lungs clear to A&P no rales wheeze rhonchi heart regular rhythm no murmurs gallops he feels rubs abdomen soft nontender bowel sounds normal active no guarding or rebound   Investigations:  Recent Results (from the past 240 hour(s))  MRSA PCR Screening     Status: None   Collection Time: 05/13/16  1:13 AM  Result Value Ref Range Status   MRSA by PCR NEGATIVE NEGATIVE Final    Comment:        The GeneXpert MRSA Assay (FDA approved for NASAL specimens only), is one component of a comprehensive MRSA colonization surveillance program. It is not intended to diagnose MRSA infection nor to guide or monitor treatment for MRSA infections.   Culture, blood (routine x 2)     Status: None (Preliminary result)   Collection Time: 05/15/16  5:59 AM  Result Value Ref Range Status   Specimen Description BLOOD  Final   Special Requests NONE  Final   Culture NO GROWTH < 12 HOURS  Final   Report Status PENDING  Incomplete  Culture, blood (routine x 2)     Status: None (Preliminary result)   Collection Time: 05/15/16  6:08 AM  Result Value Ref Range Status   Specimen Description BLOOD  Final   Special Requests NONE  Final   Culture NO GROWTH < 12 HOURS  Final   Report Status PENDING  Incomplete     Basic Metabolic Panel:  Recent Labs  81/19/1410/05/18 1608  05/13/16 0210 05/13/16 0533 05/14/16 1229  NA 135  --    --  136  --   K 3.6  --   --  3.2*  --   CL 101  --   --  105  --   CO2 17*  --   --  23  --   GLUCOSE 153*  --   --  122*  --   BUN 12  --   --  9  --   CREATININE 1.18  --   --  0.88  --   CALCIUM 9.8  --   --  8.6*  --   MG  --   < > 2.4  --  1.8  PHOS  --   --  2.3*  --   --   < > = values in this interval not displayed. Liver Function Tests:  Recent Labs  05/12/16 1607  AST 128*  ALT <5*  ALKPHOS 165*  BILITOT 0.9  PROT 9.0*  ALBUMIN 4.8     CBC:  Recent Labs  05/12/16 1607 05/13/16 0533  WBC 7.3 6.8  NEUTROABS 5.5  --   HGB 14.7 12.4*  HCT 41.1 37.4*  MCV 101.5* 101.9*  PLT 192 178    No results found.    Medications:   Impression:  Principal Problem:   Seizure due to alcohol withdrawal (HCC) Active Problems:   Chronic back pain   History of CVA (cerebrovascular accident)  Tobacco use   Anxiety disorder   Seizure (HCC)     Plan: Hepatic profile in a.m. B-12 1000 g subcutaneous daily KCl 20 mEq by mouth twice a day check CBC continue Rocephin 2 g IV every 24 hours blood cultures pending  Consultants: Neurology    Procedures EEG   Antibiotics: Rocephin         Time spent: 30 minutes   LOS: 3 days   Reighlynn Swiney M   05/15/2016, 11:46 AM

## 2016-05-16 LAB — BASIC METABOLIC PANEL
Anion gap: 7 (ref 5–15)
BUN: 14 mg/dL (ref 6–20)
CALCIUM: 8.8 mg/dL — AB (ref 8.9–10.3)
CO2: 24 mmol/L (ref 22–32)
CREATININE: 0.86 mg/dL (ref 0.61–1.24)
Chloride: 99 mmol/L — ABNORMAL LOW (ref 101–111)
Glucose, Bld: 132 mg/dL — ABNORMAL HIGH (ref 65–99)
Potassium: 3.7 mmol/L (ref 3.5–5.1)
Sodium: 130 mmol/L — ABNORMAL LOW (ref 135–145)

## 2016-05-16 LAB — HIV ANTIBODY (ROUTINE TESTING W REFLEX): HIV SCREEN 4TH GENERATION: NONREACTIVE

## 2016-05-16 LAB — RPR: RPR: NONREACTIVE

## 2016-05-16 LAB — HEPATIC FUNCTION PANEL
ALBUMIN: 3.7 g/dL (ref 3.5–5.0)
ALT: 87 U/L — ABNORMAL HIGH (ref 17–63)
AST: 93 U/L — ABNORMAL HIGH (ref 15–41)
Alkaline Phosphatase: 143 U/L — ABNORMAL HIGH (ref 38–126)
BILIRUBIN DIRECT: 0.2 mg/dL (ref 0.1–0.5)
BILIRUBIN TOTAL: 0.9 mg/dL (ref 0.3–1.2)
Indirect Bilirubin: 0.7 mg/dL (ref 0.3–0.9)
Total Protein: 8 g/dL (ref 6.5–8.1)

## 2016-05-16 NOTE — Progress Notes (Signed)
Patient more alert and oriented still on Ativan protocol receiving IV Rocephin currently afebrile awaiting blood cultures thus far negative no evidence of seizure activity since admission Deneen Hartsimothy L Gervacio [id number] DOB: 03/12/1966 DOA: 05/12/2016 PCP: Isabella StallingNDIEGO,Loy Mccartt M, MD   Physical Exam: Blood pressure 128/72, pulse 77, temperature 98.2 F (36.8 C), temperature source Oral, resp. rate 20, height 5\' 9"  (1.753 m), weight 99.8 kg (220 lb 0.3 oz), SpO2 93 %. Lungs clear to A&P no rales wheeze rhonchi heart regular rhythm no S3 or S4 no heaves thrills rubs abdomen soft nontender bowel sounds normoactive   Investigations:  Recent Results (from the past 240 hour(s))  MRSA PCR Screening     Status: None   Collection Time: 05/13/16  1:13 AM  Result Value Ref Range Status   MRSA by PCR NEGATIVE NEGATIVE Final    Comment:        The GeneXpert MRSA Assay (FDA approved for NASAL specimens only), is one component of a comprehensive MRSA colonization surveillance program. It is not intended to diagnose MRSA infection nor to guide or monitor treatment for MRSA infections.   Culture, blood (routine x 2)     Status: None (Preliminary result)   Collection Time: 05/15/16  5:59 AM  Result Value Ref Range Status   Specimen Description BLOOD RIGHT ANTECUBITAL  Final   Special Requests   Final    BOTTLES DRAWN AEROBIC AND ANAEROBIC AEB=12CC ANA=6CC   Culture NO GROWTH 1 DAY  Final   Report Status PENDING  Incomplete  Culture, blood (routine x 2)     Status: None (Preliminary result)   Collection Time: 05/15/16  6:08 AM  Result Value Ref Range Status   Specimen Description BLOOD RIGHT HAND  Final   Special Requests BOTTLES DRAWN AEROBIC AND ANAEROBIC 8CC  Final   Culture NO GROWTH 1 DAY  Final   Report Status PENDING  Incomplete     Basic Metabolic Panel:  Recent Labs  95/62/1311/13/17 1229 05/16/16 0650  NA  --  130*  K  --  3.7  CL  --  99*  CO2  --  24  GLUCOSE  --  132*  BUN  --  14   CREATININE  --  0.86  CALCIUM  --  8.8*  MG 1.8  --    Liver Function Tests:  Recent Labs  05/16/16 0650  AST 93*  ALT 87*  ALKPHOS 143*  BILITOT 0.9  PROT 8.0  ALBUMIN 3.7     CBC: No results for input(s): WBC, NEUTROABS, HGB, HCT, MCV, PLT in the last 72 hours.  No results found.    Medications:   Impression:  Principal Problem:   Seizure due to alcohol withdrawal (HCC) Active Problems:   Chronic back pain   History of CVA (cerebrovascular accident)   Tobacco use   Anxiety disorder   Seizure (HCC)     Plan: Continue B-12 continue Ativan protocol and taper seizure precautions   Consultants: Neurology   Procedures   Antibiotics: Rocephin          Time spent: 30 minutes   LOS: 4 days   Telly Broberg M   05/16/2016, 11:45 AM

## 2016-05-17 LAB — BASIC METABOLIC PANEL
ANION GAP: 8 (ref 5–15)
BUN: 14 mg/dL (ref 6–20)
CALCIUM: 9.1 mg/dL (ref 8.9–10.3)
CO2: 26 mmol/L (ref 22–32)
Chloride: 99 mmol/L — ABNORMAL LOW (ref 101–111)
Creatinine, Ser: 0.82 mg/dL (ref 0.61–1.24)
GFR calc Af Amer: >60 mL/min (ref >60)
GLUCOSE: 101 mg/dL — AB (ref 65–99)
POTASSIUM: 3.6 mmol/L (ref 3.5–5.1)
SODIUM: 133 mmol/L — AB (ref 135–145)
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
## 2016-05-17 NOTE — Care Management Note (Signed)
Case Management Note  Patient Details  Name: Anthony Hoover MRN: [id number] Date of Birth: 07/17/1965  Expected Discharge Date:    05/17/2016              Expected Discharge Plan:  Home/Self Care  In-House Referral:  NA  Discharge planning Services  CM Consult  Post Acute Care Choice:  NA Choice offered to:  NA  Status of Service:  Completed, signed off  If discussed at Long Length of Stay Meetings, dates discussed:  05/17/2016  Additional Comments: Pt discharging home today. No CM needs.   Malcolm Metrohildress, Asani Deniston Demske, RN 05/17/2016, 1:14 PM

## 2016-05-17 NOTE — Progress Notes (Signed)
Patient with orders to be discharge home. Discharge instructions given, patient verbalized understanding. Patient stable. Patient left in private vehicle with family.  

## 2016-05-20 LAB — CULTURE, BLOOD (ROUTINE X 2)
Culture: NO GROWTH
Culture: NO GROWTH

## 2016-07-03 ENCOUNTER — Other Ambulatory Visit (HOSPITAL_COMMUNITY): Payer: Self-pay | Admitting: Family Medicine

## 2016-07-03 DIAGNOSIS — G8929 Other chronic pain: Secondary | ICD-10-CM

## 2016-07-10 ENCOUNTER — Ambulatory Visit (HOSPITAL_COMMUNITY)
Admission: RE | Admit: 2016-07-10 | Discharge: 2016-07-10 | Disposition: A | Discharge status: Home / Self Care | Payer: Medicare Other | Source: Ambulatory Visit | Attending: Family Medicine | Admitting: Family Medicine

## 2016-07-10 DIAGNOSIS — M5410 Radiculopathy, site unspecified: Secondary | ICD-10-CM | POA: Insufficient documentation

## 2016-07-10 DIAGNOSIS — M5146 Schmorl's nodes, lumbar region: Secondary | ICD-10-CM | POA: Insufficient documentation

## 2016-07-10 DIAGNOSIS — M2578 Osteophyte, vertebrae: Secondary | ICD-10-CM | POA: Diagnosis not present

## 2016-07-10 DIAGNOSIS — M4854XA Collapsed vertebra, not elsewhere classified, thoracic region, initial encounter for fracture: Secondary | ICD-10-CM | POA: Insufficient documentation

## 2016-07-10 DIAGNOSIS — G8929 Other chronic pain: Secondary | ICD-10-CM

## 2017-07-23 ENCOUNTER — Encounter: Payer: Self-pay | Admitting: General Surgery

## 2017-07-23 ENCOUNTER — Ambulatory Visit (INDEPENDENT_AMBULATORY_CARE_PROVIDER_SITE_OTHER): Payer: Medicare Other | Admitting: General Surgery

## 2017-07-23 VITALS — BP 143/87 | HR 100 | Temp 98.6°F | Resp 18 | Ht 69.0 in | Wt 235.0 lb

## 2017-07-23 DIAGNOSIS — M6208 Separation of muscle (nontraumatic), other site: Secondary | ICD-10-CM | POA: Diagnosis not present

## 2017-07-23 NOTE — Patient Instructions (Signed)
Diastasis Recti Diastasis recti is when the muscles of the abdomen (rectus abdominis muscles) become thin and separate. The result is a wider space between the right and left abdomen (abdominal) muscles. This wider space between the muscles may cause a bulge in the middle of your abdomen. You may notice this bulge when you are straining or when you sit up from a lying down position. Diastasis recti can affect men and women. It is most common among pregnant women, infants, people who are obese, and people who have had abdominal surgery. Exercise or surgical treatment may help correct it. What are the causes? Common causes of this condition include:  Pregnancy. The growing uterus puts pressure on the abdominal muscles, which causes the muscles to separate.  Obesity. Excess fat puts pressure on abdominal muscles.  Weightlifting.  Some abdomen exercises.  Advanced age.  Genetics.  Prior abdominal surgery.  What increases the risk? This condition is more likely to develop in:  Women.  Newborns, especially newborns who are born early (prematurely).  What are the signs or symptoms? Common symptoms of this condition include:  A bulge in the middle of the abdomen. You will notice it most when you sit up or strain.  Pain in the low back, pelvis, or hips.  Constipation.  Inability to control when you urinate (urinary incontinence).  Bloating.  Poor posture.  How is this diagnosed? This condition is diagnosed with a physical exam. Your health care provider will ask you to lie flat on your back and do a crunch or half sit-up. If you have diastasis recti, a vertical bulge will appear between your abdominal muscles in the center of your abdomen. Your health care provider will measure the gap between your muscles with one of the following:  A medical device used to measure the space between two objects (caliper).  A tape measure.  CT scan.  Ultrasound.  Finger spaces. Your health  care provider will measure the space using their fingers.  How is this treated? If your muscle separation is not too large, you may not need treatment. However, if you are a woman who plans to become pregnant again, you should treat this condition before your next pregnancy. Treatment may include:  Physical therapy to strengthen and tighten your abdominal muscles.  Lifestyle changes such as weight loss and exercise.  Over-the-counter pain medicines as needed.  Surgery to correct the separation.  Follow these instructions at home: Activity  Return to your normal activities as told by your health care provider. Ask your health care provider what activities are safe for you.  When lifting weights or doing exercises using your abdominal muscles or the muscles in the center of your body that give stability (core muscles), make sure you are doing your exercises and movements correctly. Proper form can help to prevent the condition from happening again. General instructions  If you are overweight, ask your health care provider for help with weight loss. Losing even a small amount of weight can help to improve your diastasis recti.  Take over-the-counter or prescription medicines only as told by your health care provider.  Do not strain. Straining can make the separation worse. Examples of straining include: ? Pushing hard to have a bowel movement, such as due to constipation. ? Lifting heavy objects, including children. ? Standing up and sitting down.  Take steps to prevent constipation: ? Drink enough fluid to keep your urine clear or pale yellow. ? Take over-the-counter or prescription medicines only as   directed. ? Eat foods that are high in fiber, such as fresh fruits and vegetables, whole grains, and beans. ? Limit foods that are high in fat and processed sugars, such as fried and sweet foods. Contact a health care provider if:  You notice a new bulge in your abdomen. Get help  right away if:  You experience severe discomfort in your abdomen.  You develop severe abdominal pain along with nausea, vomiting, or fever. Summary  Diastasis recti is when the abdomen (abdominal) muscles become thin and separate. Your abdomen will stick out because the space between your right and left abdomen muscles has widened.  The most common symptom is a bulge in your abdomen. You will notice it most when you sit up or are straining.  This condition is diagnosed during a physical exam.  If the abdomen separation is not too big, you may choose not to have treatment. Otherwise, you may need to undergo physical therapy or surgery. This information is not intended to replace advice given to you by your health care provider. Make sure you discuss any questions you have with your health care provider. Document Released: 08/13/2016 Document Revised: 08/13/2016 Document Reviewed: 08/13/2016 Elsevier Interactive Patient Education  2018 Elsevier Inc.  

## 2017-07-23 NOTE — Progress Notes (Signed)
[facility] History and Physical  Reason for Referral: Possible Hernia  Referring Physician:  Dr. Janna Archondiego   Chief Complaint    Hernia      Anthony Hoover is a 52 y.o. male.  HPI: Anthony Hoover is a 52 yo male with a history of chronic back pain on methadone, CVA X 2 in 2009 without residual deficit, and chronic tobacco abuse/ alcohol abuse who comes in with complaints of a bulge in the upper abdomen that is causing him some discomfort. He also reports some chronic nausea. He says that this has been getting larger over time, and that it has been there for years. He has daily BMs that are not dark or bloody, and he does not have any vomiting. He has never had a colonoscopy.  He underwent a primary umbilical hernia repair with Dr. Malvin JohnsBradford in 2004.  He says that this area is above the area of the hernia repair.  Past Medical History:  Diagnosis Date  . Alcohol abuse   . Anxiety disorder   . Back pain    pain management by Dr. Janna ArchonDiego  . Compression fracture    T-spine  . CVA (cerebral infarction)    2009  . Hypertension   . Insomnia   . Tobacco abuse     Past Surgical History:  Procedure Laterality Date  . UMBILICAL HERNIA REPAIR      Family History  Problem Relation Age of Onset  . Diabetes Mother   . Cancer Father     Social History   Tobacco Use  . Smoking status: Current Every Day Smoker    Packs/day: 0.00  . Smokeless tobacco: Never Used  Substance Use Topics  . Alcohol use: Yes    Alcohol/week: 9.0 - 12.0 oz    Types: 15 - 20 Cans of beer per week    Comment: >12 beers daily; 6 days a week  . Drug use: No    Medications: I have reviewed the patient's current medications. Allergies as of 07/23/2017   No Known Allergies     Medication List        Accurate as of 07/23/17  9:42 AM. Always use your most recent med list.          ALPRAZolam 1 MG tablet Commonly known as:  XANAX Take 1 mg by mouth at bedtime.   lansoprazole 30 MG  capsule Commonly known as:  PREVACID Take 30 mg by mouth daily. For acid reflux   lisinopril-hydrochlorothiazide 20-12.5 MG tablet Commonly known as:  PRINZIDE,ZESTORETIC Take 1 tablet by mouth at bedtime.   methadone 10 MG tablet Commonly known as:  DOLOPHINE Take 20 mg by mouth every 8 (eight) hours as needed for pain.   tamsulosin 0.4 MG Caps capsule Commonly known as:  FLOMAX Take 1 capsule by mouth daily.        ROS:  A comprehensive review of systems was negative except for: Respiratory: positive for wheezing and cough, SOB Cardiovascular: positive for SOB on exertion, and HTN Gastrointestinal: positive for abdominal pain, nausea and reflux symptoms Genitourinary: positive for retention and frequency  Musculoskeletal: positive for back pain  Blood pressure (!) 143/87, pulse 100, temperature 98.6 F (37 C), resp. rate 18, height 5\' 9"  (1.753 m), weight 235 lb (106.6 kg). Physical Exam  Constitutional: He is oriented to person, place, and time and well-developed, well-nourished, and in no distress.  HENT:  Head: Normocephalic.  Eyes: Pupils are equal, round, and reactive to light.  Neck: Normal range of motion.  Cardiovascular: Normal rate and regular rhythm.  Pulmonary/Chest: Effort normal and breath sounds normal.  Abdominal: Soft. He exhibits no distension. There is no tenderness.  No hernia, large area of diastasis in the epigastric area, no umbilical hernia or other fascai defect appreciated  Musculoskeletal: Normal range of motion. He exhibits no edema.  Neurological: He is alert and oriented to person, place, and time.  Skin: Skin is warm and dry.  Psychiatric: Mood, memory, affect and judgment normal.  Vitals reviewed.   Results: Reviewed imaging- CT a/p from 2014 no hernia noted, diastasis at that time; MRI spine from 2018 unable to see anterior abdominal wall   Assessment & Plan:  Anthony Hoover is a 52 y.o. male with diastasis recti and no signs of  hernia on physical exam.  Discussed the pathology of diastasis recti and the causes. Discussed weight loss and some exercises/ therapy to help with the issue. Discussed that his is not a hernia, but that he could redevelop an umbilical hernia in the future at the site of Dr. Daisy Blossom repair, esp if he continues to gain weight, smoke etc.    -If worsens or has additional pain or bulge, recommend CT a/p to delineate if there is a hernia defect inferiorly towards the umbilicus that has developed in the future  -Weight loss encouraged, and exercises  -Information given about diastasis recti -Recommended that the patient gets a colonoscopy given his age, he reports that he has no history of colon cancer or blood per rectum; but discussed that screening allows for removal of polyps before cancer forms  -Follow up PRN  -No surgical intervention indicated at this time   All questions were answered to the satisfaction of the patient and family.    Lucretia Roers 07/23/2017, 9:42 AM

## 2017-10-08 IMAGING — DX DG LUMBAR SPINE COMPLETE 4+V
5 series · 5 of 5 positions shown · non-contrast
Comparison: None.

CLINICAL DATA: Acute onset of mid lower back pain. Initial
encounter.

EXAM:
LUMBAR SPINE - COMPLETE 4+ VIEW

[l-spine ap]
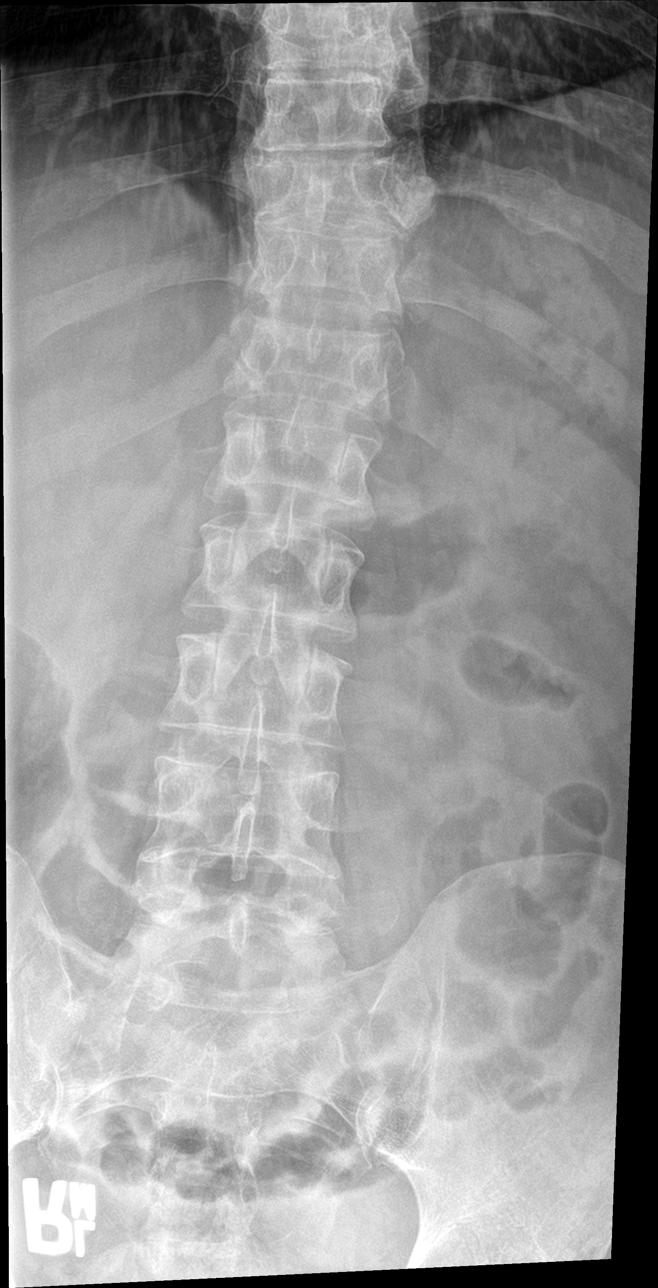

[l-spine obl (1 of 2)]
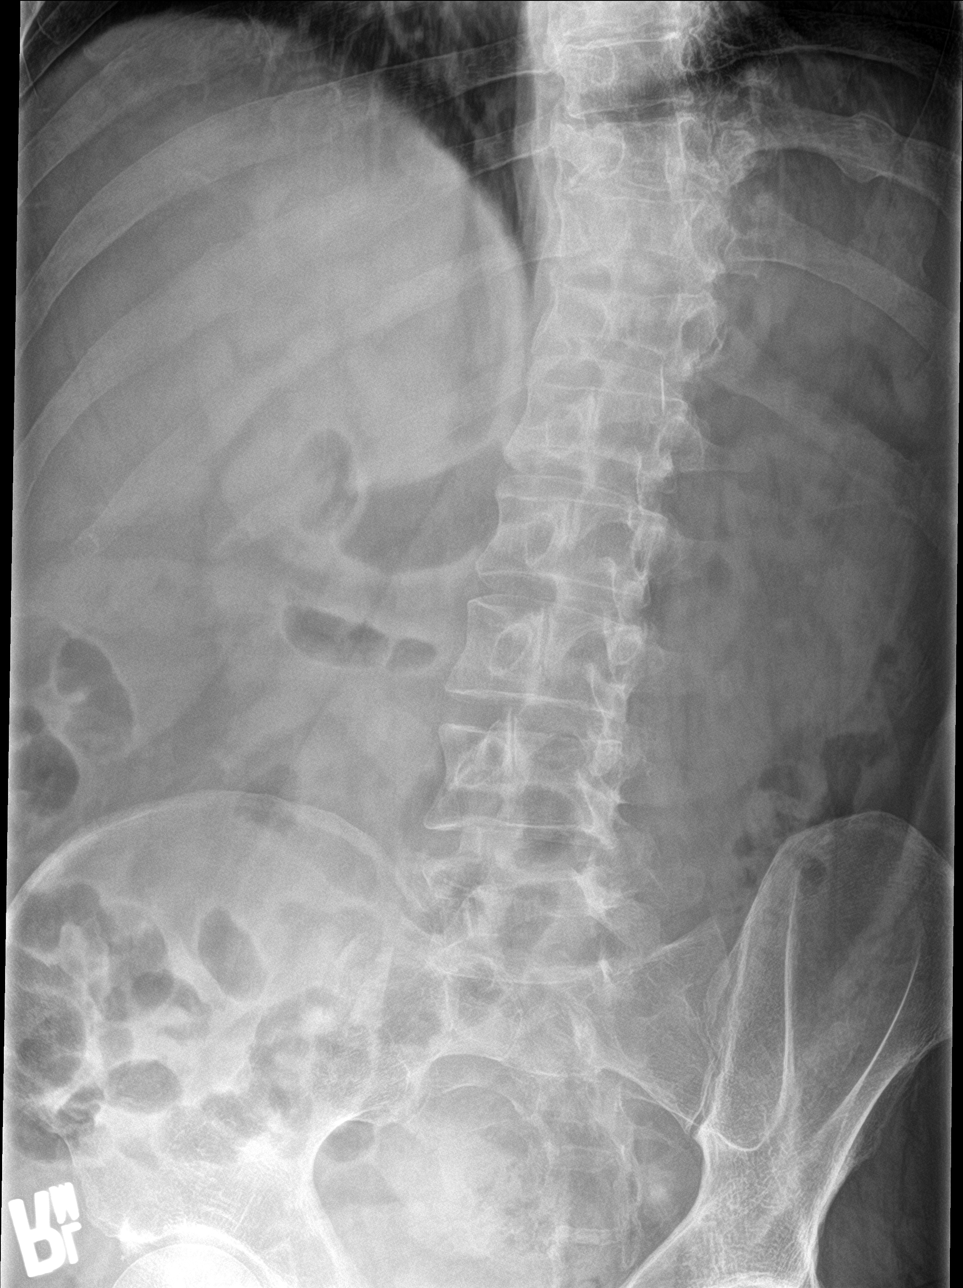

[l-spine obl (2 of 2)]
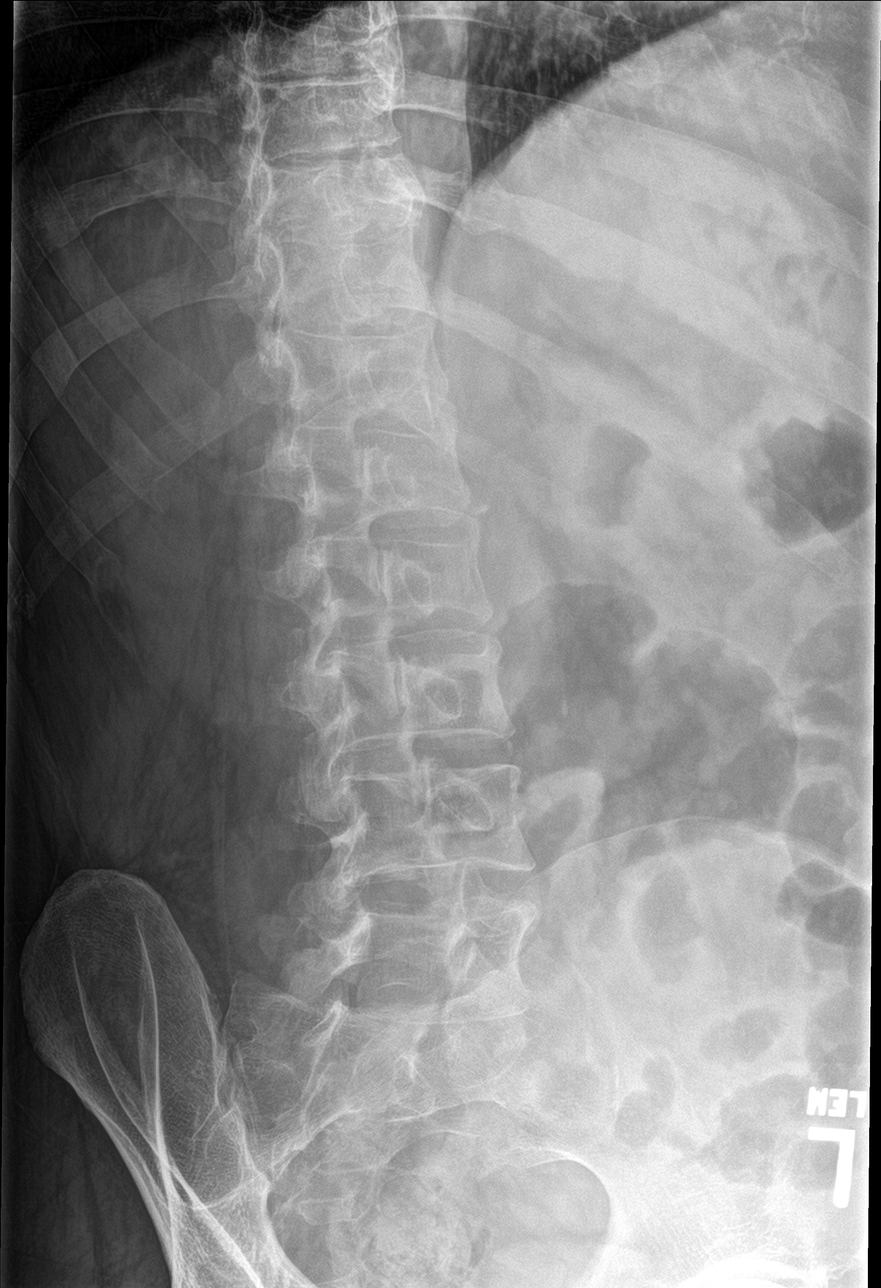

[l-spine lat]
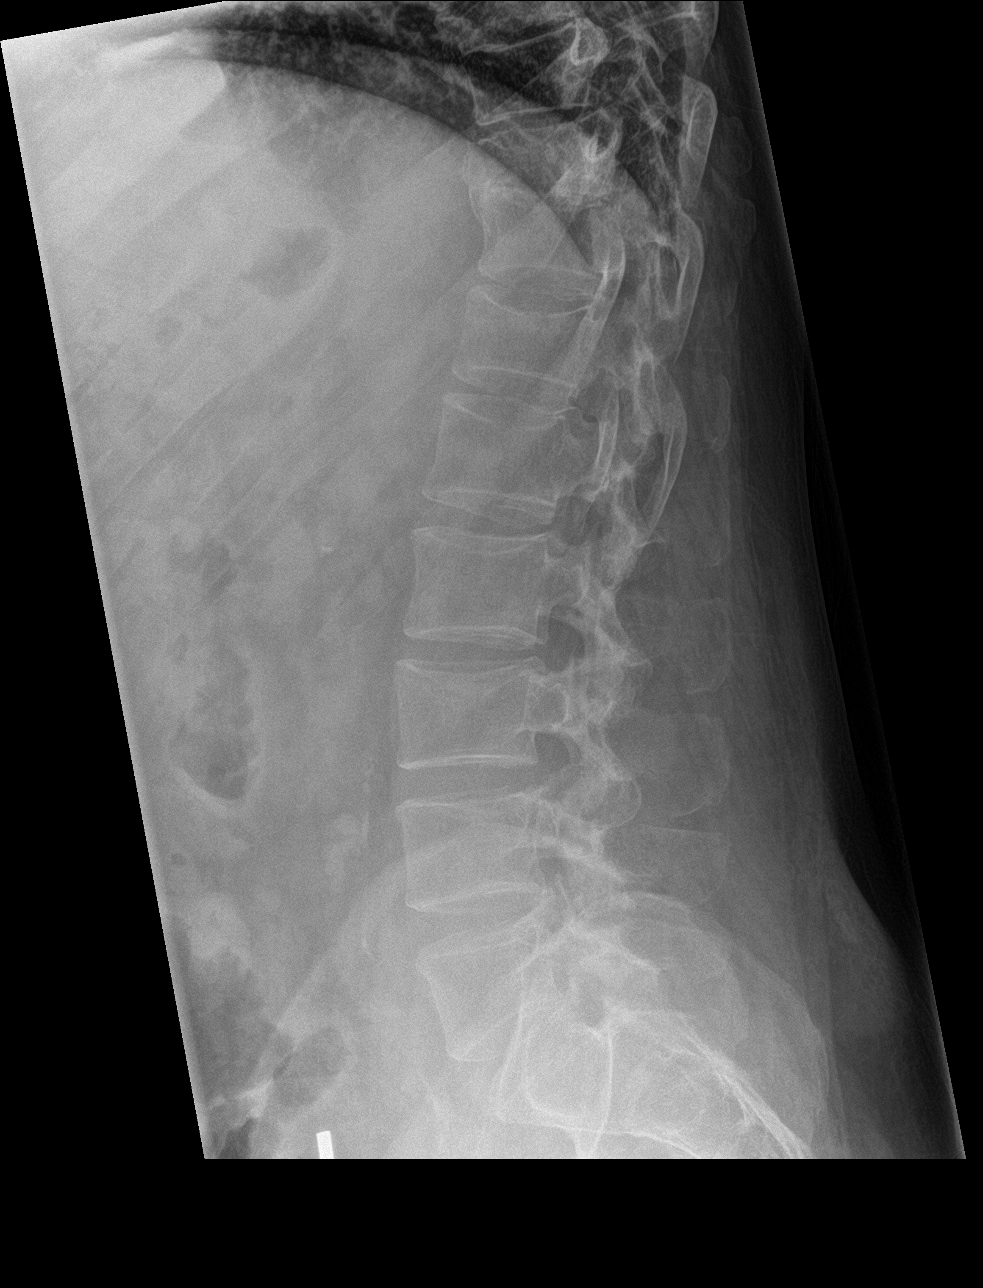

[l-spine spot]
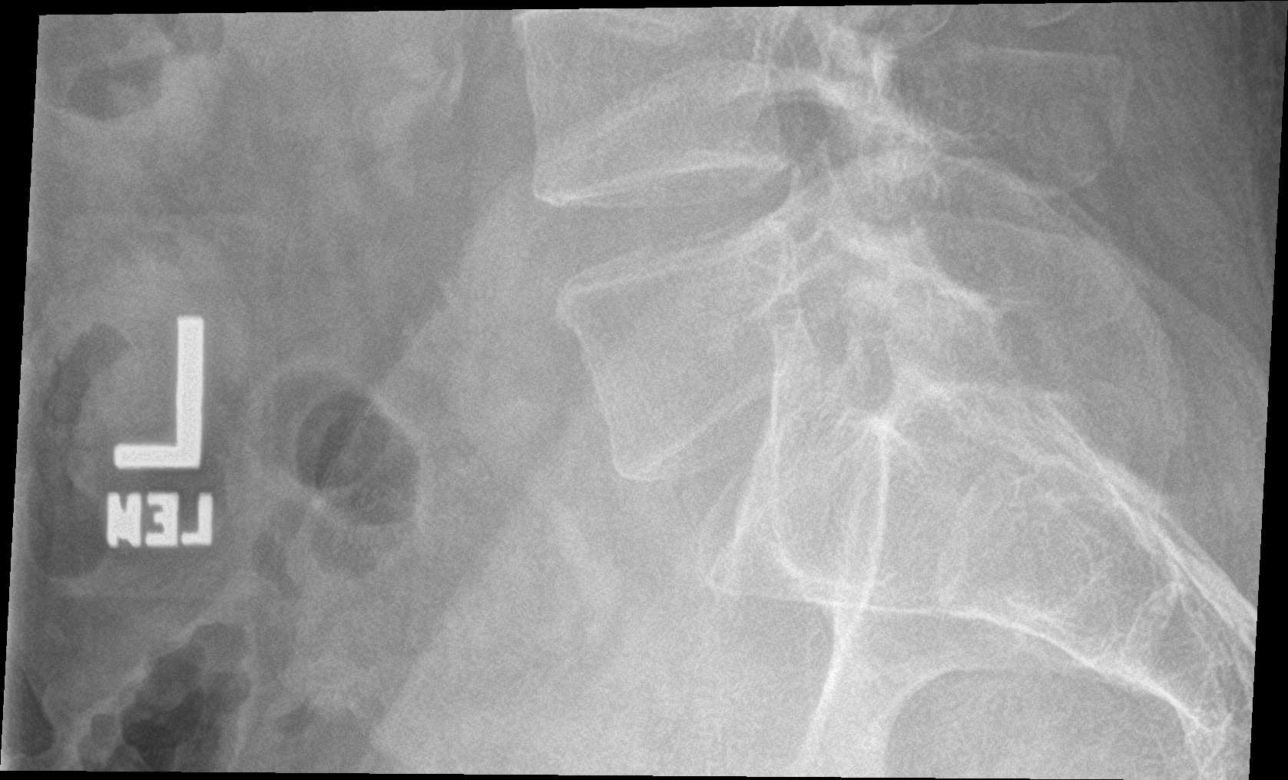

[5 of 5 positions shown; findings below may reference images not displayed]

FINDINGS: There is no evidence of fracture or subluxation. Loss of height at
vertebral body T10 is likely chronic in nature, given the patient's
history. Vertebral bodies otherwise demonstrate normal height and
alignment. Intervertebral disc spaces are preserved.

The visualized bowel gas pattern is unremarkable in appearance; air
and stool are noted within the colon. The sacroiliac joints are
within normal limits.
IMPRESSION: 1. No evidence of acute fracture or subluxation along the lumbar
spine.
2. Loss of height at vertebral body T10 is likely chronic in nature,
given the patient's history of vertebral body fracture.

## 2017-10-08 IMAGING — CT CT HEAD W/O CM
3 series · 15 of 47 positions shown, 18 images · non-contrast
Comparison: CT of the head performed 09/24/2011

CLINICAL DATA: Acute onset of headache and altered mental status.
Jerking movements, with postictal state. Initial encounter.

EXAM:
CT HEAD WITHOUT CONTRAST
TECHNIQUE: Contiguous axial images were obtained from the base of the skull
through the vertex without intravenous contrast.

[Series 2: head trauma wo · axial · 0.47mm/px · z∈[+157,+282]mm · 9 of 31 slices shown, 12 images]
[im 3/31  brain]
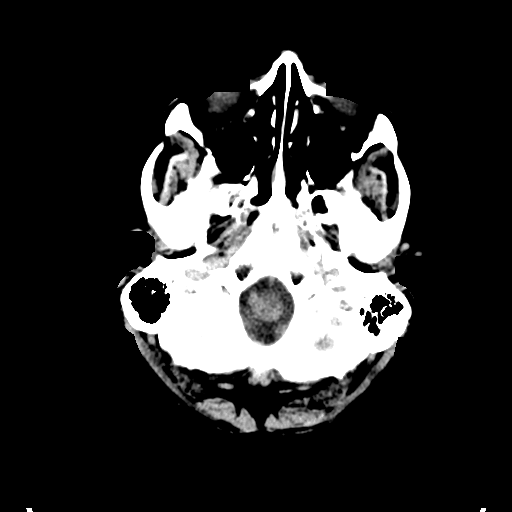
[im 3/31  bone]
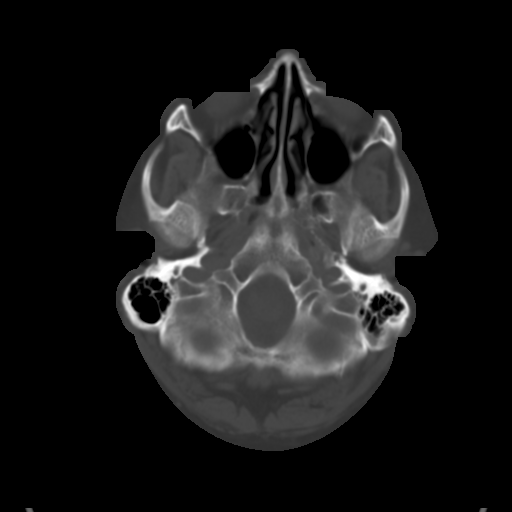
[im 6/31  brain]
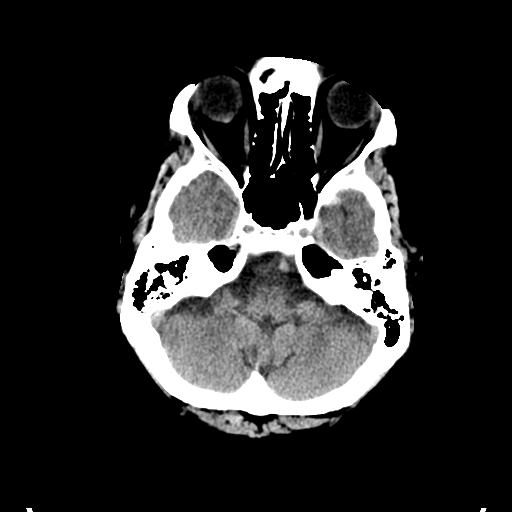
[im 9/31  brain]
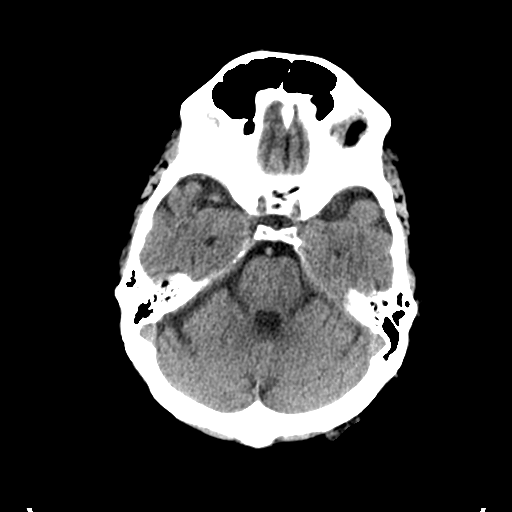
[im 12/31  brain]
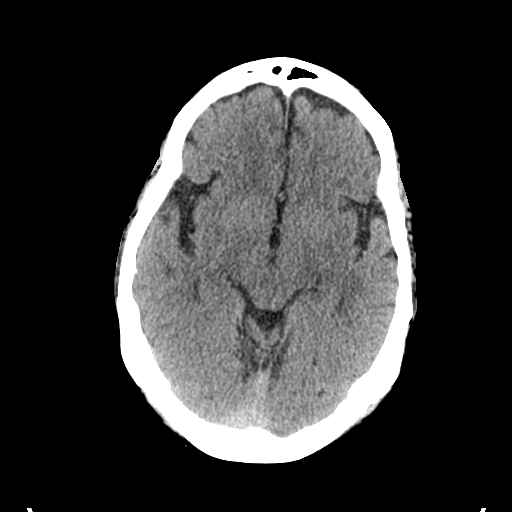
[im 16/31  brain]
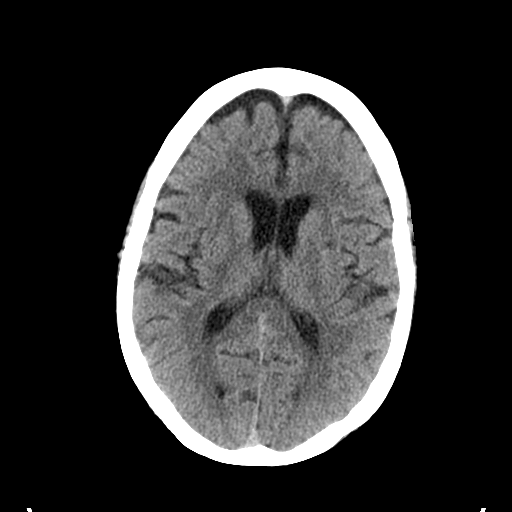
[im 16/31  bone]
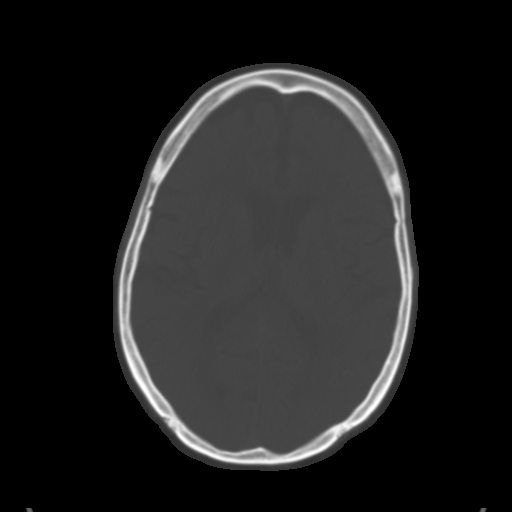
[im 19/31  brain]
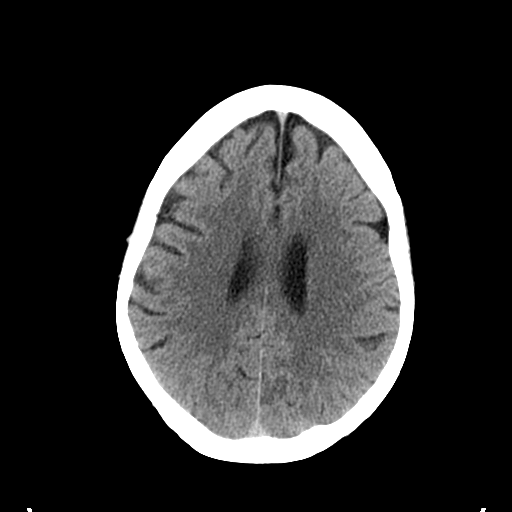
[im 22/31  brain]
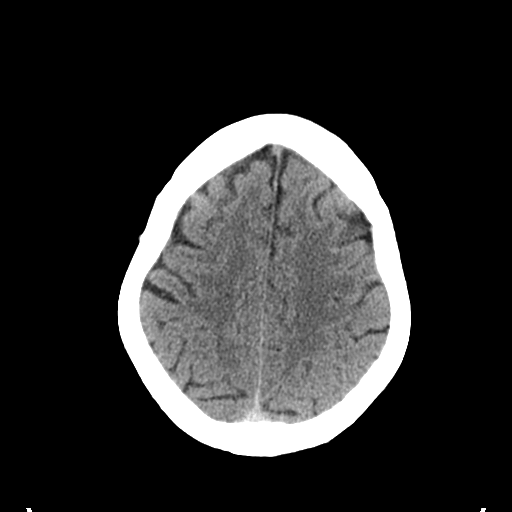
[im 25/31  brain]
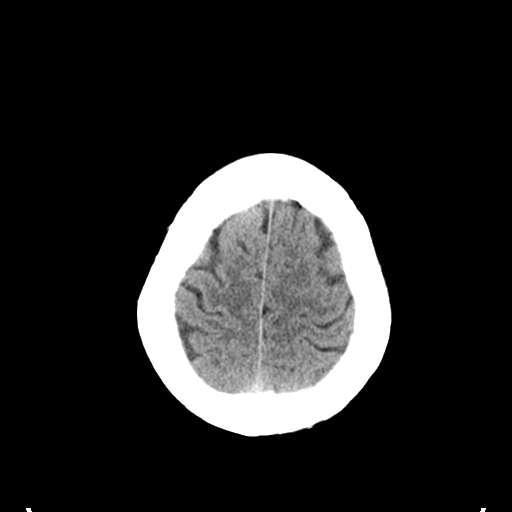
[im 28/31  brain]
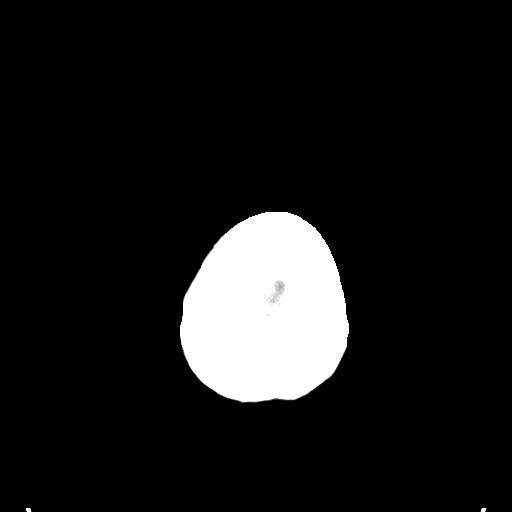
[im 28/31  bone]
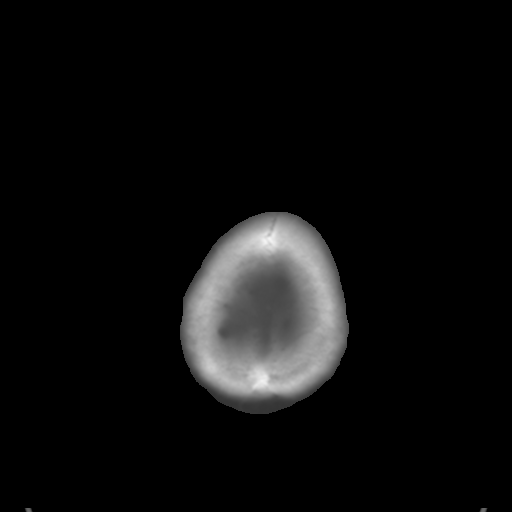

[Series 4: coronal soft tissue · coronal · 0.33mm/px · 3 of 82 slices shown]
[im 28/82  brain]
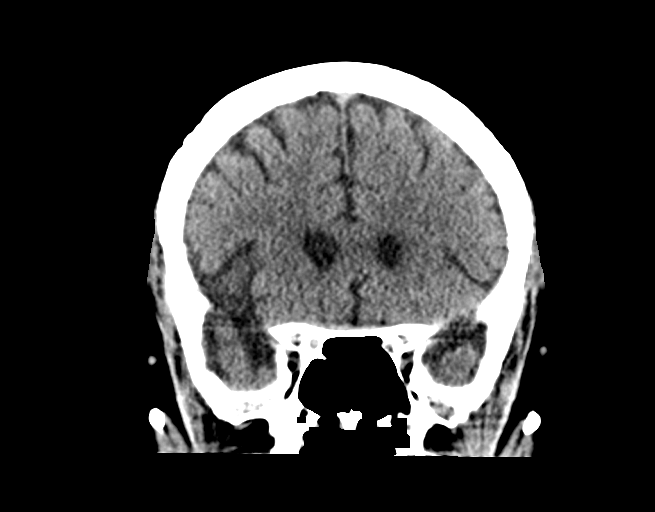
[im 37/82  brain]
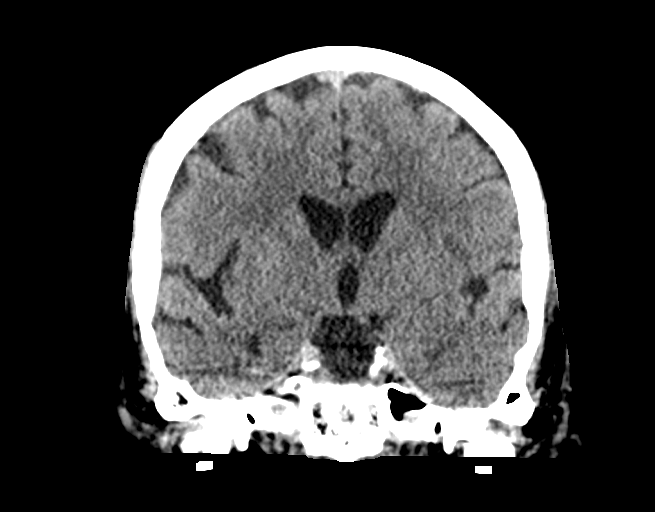
[im 46/82  brain]
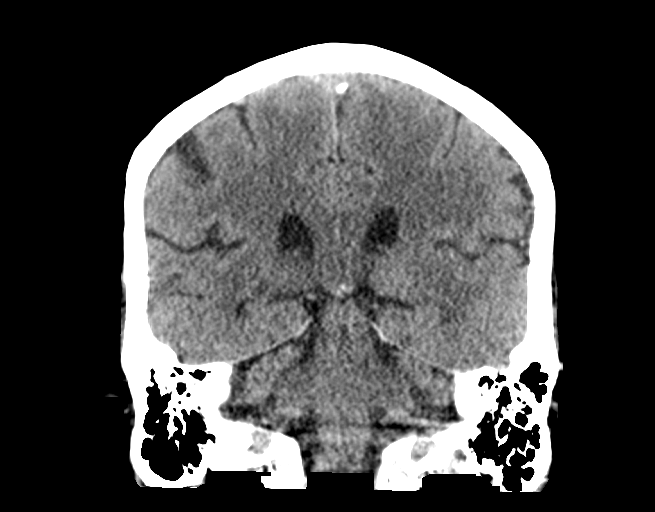

[Series 5: sagittal soft tissue · sagittal · 0.32mm/px · 3 of 61 slices shown]
[im 21/61  brain]
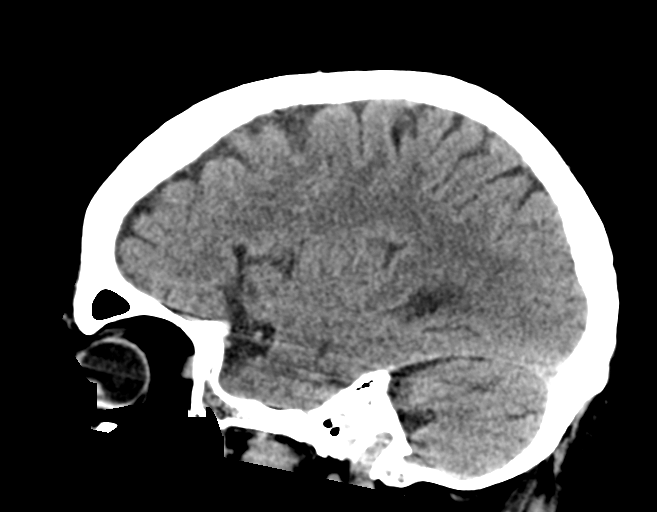
[im 31/61  brain]
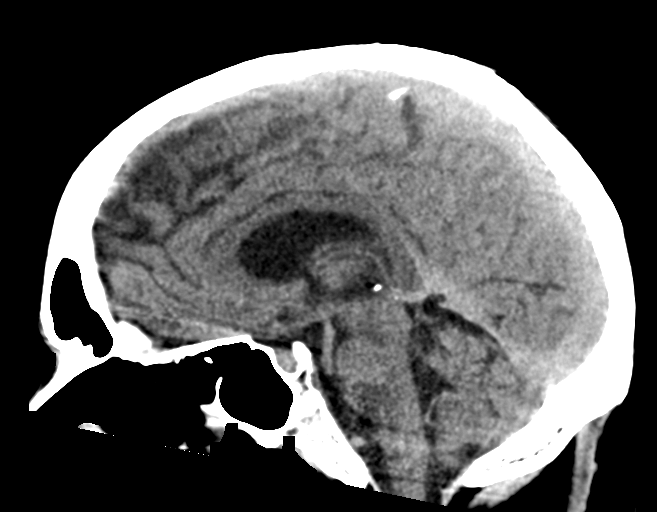
[im 41/61  brain]
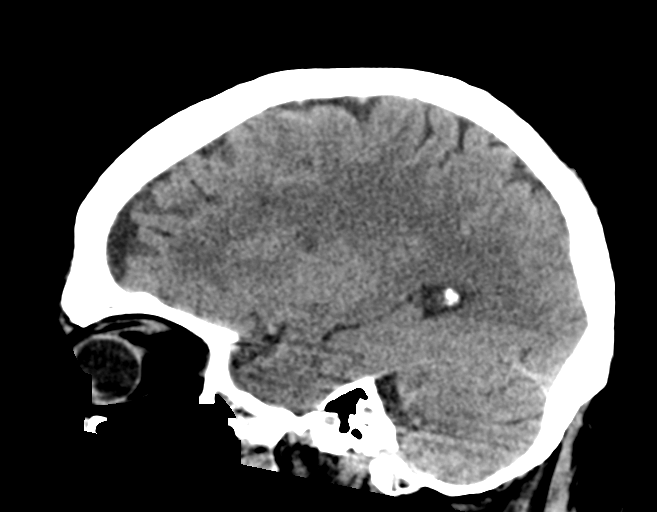

[15 of 47 positions shown; findings below may reference images not displayed]

FINDINGS: Brain: No evidence of acute infarction, hemorrhage, hydrocephalus,
extra-axial collection or mass lesion/mass effect.

Prominence of the sulci suggests mild cortical volume loss. Small
chronic lacunar infarcts are noted at the right corona radiata and
left basal ganglia.

The brainstem and fourth ventricle are within normal limits. The
cerebral hemispheres demonstrate grossly normal gray-white
differentiation. No mass effect or midline shift is seen.

Vascular: No hyperdense vessel or unexpected calcification.

Skull: There is no evidence of fracture; visualized osseous
structures are unremarkable in appearance.

Sinuses/Orbits: The orbits are within normal limits. The paranasal
sinuses and mastoid air cells are well-aerated.

Other: No significant soft tissue abnormalities are seen.
IMPRESSION: 1. No acute intracranial pathology seen on CT.
2. Mild cortical volume loss noted.
3. Small chronic lacunar infarcts at the right corona radiata and
left basal ganglia.

## 2017-10-09 IMAGING — CR DG CHEST 1V PORT
1 series · 1 of 1 positions shown · non-contrast
Comparison: 11/17/2015

CLINICAL DATA: Fever.  Chronic back pain.

EXAM:
PORTABLE CHEST 1 VIEW

[portable]
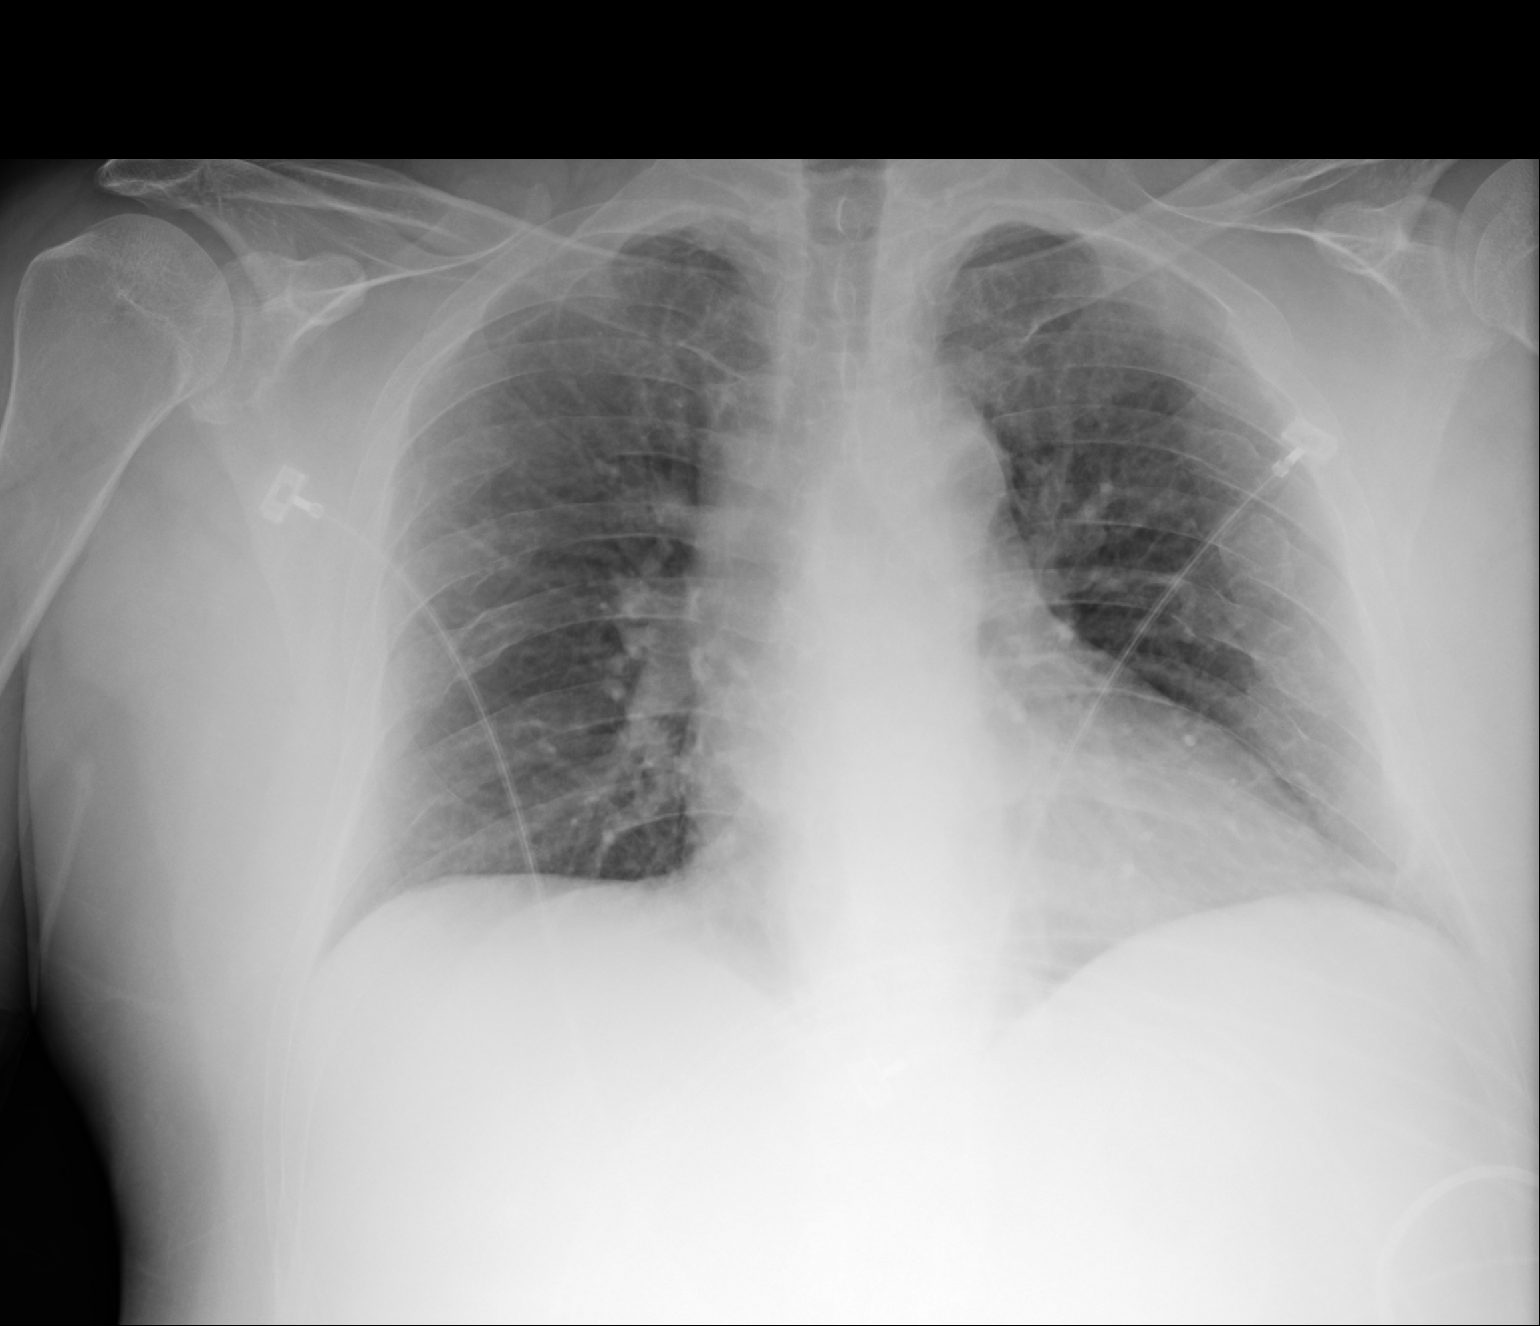

[1 of 1 positions shown; findings below may reference images not displayed]

FINDINGS: Shallow inspiration with linear atelectasis in the lung bases. Heart
size and pulmonary vascularity are normal for technique. Multiple
old bilateral rib fractures. No focal airspace disease or
consolidation in the lungs. No pneumothorax.
IMPRESSION: Shallow inspiration with atelectasis in the lung bases. No evidence
of active pulmonary disease.

## 2017-10-30 ENCOUNTER — Other Ambulatory Visit: Payer: Self-pay

## 2017-10-30 ENCOUNTER — Encounter (HOSPITAL_COMMUNITY): Payer: Self-pay | Admitting: Emergency Medicine

## 2017-10-30 ENCOUNTER — Emergency Department (HOSPITAL_COMMUNITY)
Admission: EM | Admit: 2017-10-30 | Discharge: 2017-10-30 | Disposition: A | Discharge status: Home / Self Care | Payer: Medicare Other | Attending: Emergency Medicine | Admitting: Emergency Medicine

## 2017-10-30 ENCOUNTER — Emergency Department (HOSPITAL_COMMUNITY): Payer: Medicare Other

## 2017-10-30 DIAGNOSIS — Z87891 Personal history of nicotine dependence: Secondary | ICD-10-CM | POA: Insufficient documentation

## 2017-10-30 DIAGNOSIS — R0602 Shortness of breath: Secondary | ICD-10-CM | POA: Insufficient documentation

## 2017-10-30 DIAGNOSIS — R079 Chest pain, unspecified: Secondary | ICD-10-CM | POA: Diagnosis present

## 2017-10-30 DIAGNOSIS — Z79899 Other long term (current) drug therapy: Secondary | ICD-10-CM | POA: Insufficient documentation

## 2017-10-30 DIAGNOSIS — R000 Tachycardia, unspecified: Secondary | ICD-10-CM

## 2017-10-30 DIAGNOSIS — I10 Essential (primary) hypertension: Secondary | ICD-10-CM | POA: Insufficient documentation

## 2017-10-30 LAB — CBC WITH DIFFERENTIAL/PLATELET
BASOS ABS: 0 10*3/uL (ref 0.0–0.1)
BASOS PCT: 1 %
EOS PCT: 2 %
Eosinophils Absolute: 0.1 10*3/uL (ref 0.0–0.7)
HCT: 43.2 % (ref 39.0–52.0)
Hemoglobin: 14.1 g/dL (ref 13.0–17.0)
Lymphocytes Relative: 16 %
Lymphs Abs: 1 10*3/uL (ref 0.7–4.0)
MCH: 33.6 pg (ref 26.0–34.0)
MCHC: 32.6 g/dL (ref 30.0–36.0)
MCV: 102.9 fL — ABNORMAL HIGH (ref 78.0–100.0)
MONO ABS: 0.6 10*3/uL (ref 0.1–1.0)
Monocytes Relative: 10 %
Neutro Abs: 4.5 10*3/uL (ref 1.7–7.7)
Neutrophils Relative %: 71 %
PLATELETS: 228 10*3/uL (ref 150–400)
RBC: 4.2 MIL/uL — ABNORMAL LOW (ref 4.22–5.81)
RDW: 13 % (ref 11.5–15.5)
WBC: 6.2 10*3/uL (ref 4.0–10.5)

## 2017-10-30 LAB — URINALYSIS, ROUTINE W REFLEX MICROSCOPIC
Bilirubin Urine: NEGATIVE
Glucose, UA: NEGATIVE mg/dL
Hgb urine dipstick: NEGATIVE
KETONES UR: NEGATIVE mg/dL
LEUKOCYTES UA: NEGATIVE
Nitrite: NEGATIVE
Protein, ur: NEGATIVE mg/dL
Specific Gravity, Urine: 1.02 (ref 1.005–1.030)
pH: 5 (ref 5.0–8.0)

## 2017-10-30 LAB — I-STAT CG4 LACTIC ACID, ED
LACTIC ACID, VENOUS: 1.59 mmol/L (ref 0.5–1.9)
Lactic Acid, Venous: 1.99 mmol/L — ABNORMAL HIGH (ref 0.5–1.9)
Lactic Acid, Venous: 2.03 mmol/L (ref 0.5–1.9)

## 2017-10-30 LAB — BASIC METABOLIC PANEL
ANION GAP: 16 — AB (ref 5–15)
BUN: 7 mg/dL (ref 6–20)
CALCIUM: 9.8 mg/dL (ref 8.9–10.3)
CO2: 25 mmol/L (ref 22–32)
Chloride: 97 mmol/L — ABNORMAL LOW (ref 101–111)
Creatinine, Ser: 0.75 mg/dL (ref 0.61–1.24)
GFR calc non Af Amer: >60 mL/min (ref >60)
Glucose, Bld: 94 mg/dL (ref 65–99)
Potassium: 3.6 mmol/L (ref 3.5–5.1)
SODIUM: 138 mmol/L (ref 135–145)

## 2017-10-30 LAB — TSH: TSH: 2.874 u[IU]/mL (ref 0.350–4.500)

## 2017-10-30 LAB — TROPONIN I: Troponin I: <0.03 ng/mL (ref <0.03)

## 2017-10-30 LAB — BRAIN NATRIURETIC PEPTIDE: B NATRIURETIC PEPTIDE 5: 29 pg/mL (ref 0.0–100.0)

## 2017-10-30 LAB — D-DIMER, QUANTITATIVE (NOT AT ARMC): D DIMER QUANT: 0.27 ug{FEU}/mL (ref 0.00–0.50)

## 2017-10-30 LAB — ETHANOL: Alcohol, Ethyl (B): <10 mg/dL (ref <10)

## 2017-10-30 MED ORDER — SODIUM CHLORIDE 0.9 % IV BOLUS
1000.0000 mL | Freq: Once | INTRAVENOUS | Status: AC
  Administered 2017-10-30: 1000 mL via INTRAVENOUS

## 2017-10-30 MED ORDER — THIAMINE HCL 100 MG/ML IJ SOLN: 100.0000 mg | Freq: Every day | INTRAMUSCULAR | Status: DC

## 2017-10-30 MED ORDER — LORAZEPAM 1 MG PO TABS: 0.0000 mg | ORAL_TABLET | Freq: Two times a day (BID) | ORAL | Status: DC

## 2017-10-30 MED ORDER — LORAZEPAM 1 MG PO TABS: 0.0000 mg | ORAL_TABLET | Freq: Four times a day (QID) | ORAL | Status: DC

## 2017-10-30 MED ORDER — ALBUTEROL SULFATE HFA 108 (90 BASE) MCG/ACT IN AERS: 1.0000 | INHALATION_SPRAY | Freq: Four times a day (QID) | RESPIRATORY_TRACT | 0 refills | Status: DC | PRN

## 2017-10-30 MED ORDER — ACETAMINOPHEN 325 MG PO TABS
650.0000 mg | ORAL_TABLET | Freq: Once | ORAL | Status: AC
  Administered 2017-10-30: 650 mg via ORAL
  Filled 2017-10-30: qty 2

## 2017-10-30 MED ORDER — LORAZEPAM 2 MG/ML IJ SOLN
1.0000 mg | Freq: Once | INTRAMUSCULAR | Status: AC
  Administered 2017-10-30: 1 mg via INTRAVENOUS
  Filled 2017-10-30: qty 1

## 2017-10-30 MED ORDER — PREDNISONE 10 MG PO TABS: 20.0000 mg | ORAL_TABLET | Freq: Every day | ORAL | 0 refills | Status: DC

## 2017-10-30 MED ORDER — LORAZEPAM 2 MG/ML IJ SOLN: 0.0000 mg | Freq: Two times a day (BID) | INTRAMUSCULAR | Status: DC

## 2017-10-30 MED ORDER — IPRATROPIUM-ALBUTEROL 0.5-2.5 (3) MG/3ML IN SOLN
3.0000 mL | Freq: Once | RESPIRATORY_TRACT | Status: AC
  Administered 2017-10-30: 3 mL via RESPIRATORY_TRACT
  Filled 2017-10-30: qty 3

## 2017-10-30 MED ORDER — IOPAMIDOL (ISOVUE-300) INJECTION 61%
75.0000 mL | Freq: Once | INTRAVENOUS | Status: AC | PRN
  Administered 2017-10-30: 75 mL via INTRAVENOUS

## 2017-10-30 MED ORDER — IBUPROFEN 400 MG PO TABS
400.0000 mg | ORAL_TABLET | Freq: Once | ORAL | Status: AC
  Administered 2017-10-30: 400 mg via ORAL
  Filled 2017-10-30: qty 1

## 2017-10-30 MED ORDER — VITAMIN B-1 100 MG PO TABS
100.0000 mg | ORAL_TABLET | Freq: Every day | ORAL | Status: DC
  Administered 2017-10-30: 100 mg via ORAL
  Filled 2017-10-30: qty 1

## 2017-10-30 MED ORDER — LORAZEPAM 2 MG/ML IJ SOLN: 0.0000 mg | Freq: Four times a day (QID) | INTRAMUSCULAR | Status: DC

## 2017-10-30 NOTE — ED Provider Notes (Signed)
Patient rechecked several times in his hospital course.  His pulse has decreased to the 100 range.  He feels much better.  Color is good.  No ectopy on the monitor.  D-dimer and troponin negative.  Discussed alcohol consumption.  Patient is most concerned about his sinus congestion.  Will Rx albuterol inhaler, prednisone, Flonase nasal spray.  He understands to return if symptoms worsen   Donnetta Hutching, MD 10/30/17 1319

## 2017-10-30 NOTE — Discharge Instructions (Signed)
Your pulse was initially high, but it is come down to more normal limits.  Recommend decreasing your alcohol consumption.  Prescription for prednisone and inhaler.  Would also recommend over-the-counter Flonase.  Rest.  Increase fluids.  Return if symptoms worsen.

## 2017-10-30 NOTE — ED Triage Notes (Signed)
Pt c/o increased sob and chest tightness that started today.

## 2017-10-30 NOTE — ED Provider Notes (Signed)
[facility] EMERGENCY DEPARTMENT Provider Note   CSN: [id number] Arrival date & time: 10/30/17  0340     History   Chief Complaint Chief Complaint  Patient presents with  . Shortness of Breath    HPI Anthony Hoover is a 52 y.o. male.  HPI  This is a 52 year old male who presents with chest pain and shortness of breath.  Patient reports worsening shortness of breath over the last 24 to 48 hours.  He reports cough.  Denies history of heart failure or COPD.  He states that he developed chest tightness 24 hours ago.  This self resolved but returned tonight.  He states the tightness feels like pressure and is currently 5 out of 10.  He also endorses palpitations.  He has not taken anything for his symptoms.  He denies any fevers or infectious symptoms.  He denies history of heart disease.  He does have a history of stroke, hypertension, tobacco use.  She denies any history of blood clots, leg swelling, recent travel or hospitalization.  Past Medical History:  Diagnosis Date  . Alcohol abuse   . Anxiety disorder   . Back pain    pain management by Dr. Janna Arch  . Compression fracture    T-spine  . CVA (cerebral infarction)    2009  . Hypertension   . Insomnia   . Tobacco abuse     Patient Active Problem List   Diagnosis Date Noted  . Diastasis recti 07/23/2017  . Seizure due to alcohol withdrawal (HCC) 05/12/2016  . Seizure (HCC) 05/12/2016  . Back pain   . CVA (cerebral infarction)   . Anxiety disorder   . Fall from tree 09/25/2011  . Multiple fractures of ribs of left side 09/25/2011  . Right pulmonary contusion 09/25/2011  . Hypertension 09/25/2011  . Chronic back pain 09/25/2011  . Wedge compression fracture of T8 vertebra (HCC) 09/25/2011  . Wedge compression fracture of T10 vertebra (HCC) 09/25/2011  . Sternal fracture 09/25/2011  . Fracture of transverse process of thoracic vertebra (HCC) 09/25/2011  . Heavy alcohol use 09/25/2011  . Acute blood loss anemia  09/25/2011  . History of CVA (cerebrovascular accident) 09/25/2011  . Tobacco use 09/25/2011    Past Surgical History:  Procedure Laterality Date  . UMBILICAL HERNIA REPAIR          Home Medications    Prior to Admission medications   Medication Sig Start Date End Date Taking? Authorizing Provider  ALPRAZolam Prudy Feeler) 1 MG tablet Take 1 mg by mouth at bedtime.     [provider]  lansoprazole (PREVACID) 30 MG capsule Take 30 mg by mouth daily. For acid reflux    [provider]  lisinopril-hydrochlorothiazide (PRINZIDE,ZESTORETIC) 20-12.5 MG per tablet Take 1 tablet by mouth at bedtime.     [provider]  methadone (DOLOPHINE) 10 MG tablet Take 20 mg by mouth every 8 (eight) hours as needed for pain.    [provider]  tamsulosin (FLOMAX) 0.4 MG CAPS capsule Take 1 capsule by mouth daily. 10/18/15   [provider]    Family History Family History  Problem Relation Age of Onset  . Diabetes Mother   . Cancer Father   . Heart disease Father     Social History Social History   Tobacco Use  . Smoking status: Former Smoker    Packs/day: 0.00  . Smokeless tobacco: Never Used  Substance Use Topics  . Alcohol use: Not Currently  Alcohol/week: 9.0 - 12.0 oz    Types: 15 - 20 Cans of beer per week    Comment: >12 beers daily; 6 days a week  . Drug use: No     Allergies   Patient has no known allergies.   Review of Systems Review of Systems  Constitutional: Positive for fever.  Respiratory: Positive for cough, chest tightness and shortness of breath.   Cardiovascular: Positive for chest pain and palpitations. Negative for leg swelling.  Gastrointestinal: Negative for abdominal pain, diarrhea, nausea and vomiting.  Genitourinary: Negative for dysuria.  All other systems reviewed and are negative.    Physical Exam Updated Vital Signs BP 121/77   Pulse (!) 120   Temp (!) 101.2 F (38.4 C) (Rectal)   Resp 20   Ht   (1.778 m)   Wt 106.6 kg (235 lb)   SpO2 92%   BMI 33.72 kg/m   Physical Exam  Constitutional: He is oriented to person, place, and time.  Ill-appearing but nontoxic, mild tachypnea noted  HENT:  Head: Normocephalic and atraumatic.  Cardiovascular: Normal rate, regular rhythm and normal heart sounds.  No murmur heard. Tachycardia  Pulmonary/Chest: Tachypnea noted. No respiratory distress. He has wheezes.  Distant breath sounds, occasional expiratory wheeze, fair air movement, mild tachypnea  Abdominal: Soft. Bowel sounds are normal. There is no tenderness. There is no rebound.  Musculoskeletal: He exhibits no edema.       Right lower leg: He exhibits no edema.       Left lower leg: He exhibits no edema.  Lymphadenopathy:    He has no cervical adenopathy.  Neurological: He is alert and oriented to person, place, and time.  Skin: Skin is warm and dry.  Psychiatric: He has a normal mood and affect.  Nursing note and vitals reviewed.    ED Treatments / Results  Labs (all labs ordered are listed, but only abnormal results are displayed) Labs Reviewed  CBC WITH DIFFERENTIAL/PLATELET - Abnormal; Notable for the following components:      Result Value   RBC 4.20 (*)    MCV 102.9 (*)    All other components within normal limits  BASIC METABOLIC PANEL - Abnormal; Notable for the following components:   Chloride 97 (*)    Anion gap 16 (*)    All other components within normal limits  I-STAT CG4 LACTIC ACID, ED - Abnormal; Notable for the following components:   Lactic Acid, Venous 1.99 (*)    All other components within normal limits  I-STAT CG4 LACTIC ACID, ED - Abnormal; Notable for the following components:   Lactic Acid, Venous 2.03 (*)    All other components within normal limits  TROPONIN I  BRAIN NATRIURETIC PEPTIDE  D-DIMER, QUANTITATIVE (NOT AT [facility])  ETHANOL    EKG EKG Interpretation  Date/Time:  Wednesday Oct 30 2017 03:50:05 EDT Ventricular Rate:   132 PR Interval:    QRS Duration: 88 QT Interval:  294 QTC Calculation: 436 R Axis:   42 Text Interpretation:  Sinus tachycardia Atrial premature complex Confirmed by Ross Marcus ([id number]) on 10/30/2017 5:15:25 AM   Radiology Ct Chest W Contrast  Result Date: 10/30/2017 CLINICAL DATA:  Increased shortness of breath and chest tightness starting today. EXAM: CT CHEST WITH CONTRAST TECHNIQUE: Multidetector CT imaging of the chest was performed during intravenous contrast administration. CONTRAST:  75mL ISOVUE-300 IOPAMIDOL (ISOVUE-300) INJECTION 61% COMPARISON:  Multiple prior CT thoracic spine, most recent 05/07/2012 FINDINGS: Cardiovascular: Normal heart size. No  pericardial effusion. Coronary artery calcifications. Normal caliber thoracic aorta with scattered calcification. Asymmetrical prominence of the hemi azygous vein. Mediastinum/Nodes: Prominent mediastinal lymph nodes without pathologic enlargement, likely reactive. Esophagus is decompressed. Thyroid gland is unremarkable. Lungs/Pleura: Mild dependent changes in the lungs. No airspace disease, consolidation, or edema. Mild scarring in the left lung base. No pleural effusions. No pneumothorax. Airways are patent. Upper Abdomen: Diffuse fatty infiltration of the liver. No acute process demonstrated in the visualized upper abdomen. Musculoskeletal: Old compression deformities of chronic compression of T8 and T10 vertebrae. Old fracture deformity of the sternum. No definite progression since previous studies. Mild anterior compression of T12 is new since prior studies but associated degenerative changes suggest that this is not acute. IMPRESSION: 1. No evidence of active pulmonary disease. 2. Diffuse fatty infiltration of the liver. 3. Chronic appearing compression fractures of T8, T10, and T12 vertebrae. Old sternal fracture. 4. Aortic atherosclerosis.  Coronary artery calcifications. Aortic Atherosclerosis (ICD10-I70.0). Electronically Signed   By:  Burman Nieves M.D.   On: 10/30/2017 07:00   Dg Chest Portable 1 View  Result Date: 10/30/2017 CLINICAL DATA:  Increased shortness of breath and chest tightness starting today. EXAM: PORTABLE CHEST 1 VIEW COMPARISON:  05/13/2016 FINDINGS: Heart size and pulmonary vascularity are normal for technique. Slightly shallow inspiration. No airspace disease or consolidation in the lungs. No blunting of costophrenic angles. No pneumothorax. Old bilateral rib fractures. IMPRESSION: Shallow inspiration.  No evidence of active pulmonary disease. Electronically Signed   By: Burman Nieves M.D.   On: 10/30/2017 04:22    Procedures Procedures (including critical care time)    EMERGENCY DEPARTMENT [place] CARDIAC EXAM "Study: Limited Ultrasound of the Heart and Pericardium"  INDICATIONS:Abnormal vital signs and Dyspnea Multiple views of the heart and pericardium were obtained in real-time with a multi-frequency probe.  PERFORMED WU:[id number] IMAGES ARCHIVED?: No LIMITATIONS:  Body habitus and Emergent procedure VIEWS USED: Subcostal 4 chamber and Parasternal long axis INTERPRETATION: Pericardial effusioin absent and Cardiac tamponade absent   CRITICAL CARE Performed by: Shon Baton   Total critical care time: 30 minutes  Critical care time was exclusive of separately billable procedures and treating other patients.  Critical care was necessary to treat or prevent imminent or life-threatening deterioration.  Critical care was time spent personally by me on the following activities: development of treatment plan with patient and/or surrogate as well as nursing, discussions with consultants, evaluation of patient's response to treatment, examination of patient, obtaining history from patient or surrogate, ordering and performing treatments and interventions, ordering and review of laboratory studies, ordering and review of radiographic studies, pulse oximetry and re-evaluation of patient's  condition.   Medications Ordered in ED Medications  sodium chloride 0.9 % bolus 1,000 mL (1,000 mLs Intravenous New Bag/Given 10/30/17 0727)  LORazepam (ATIVAN) injection 0-4 mg (has no administration in time range)    Or  LORazepam (ATIVAN) tablet 0-4 mg (has no administration in time range)  LORazepam (ATIVAN) injection 0-4 mg (has no administration in time range)    Or  LORazepam (ATIVAN) tablet 0-4 mg (has no administration in time range)  thiamine (VITAMIN B-1) tablet 100 mg (100 mg Oral Given 10/30/17 0737)    Or  thiamine (B-1) injection 100 mg ( Intravenous See Alternative 10/30/17 0737)  sodium chloride 0.9 % bolus 1,000 mL (0 mLs Intravenous Stopped 10/30/17 0534)  acetaminophen (TYLENOL) tablet 650 mg (650 mg Oral Given 10/30/17 0430)  ipratropium-albuterol (DUONEB) 0.5-2.5 (3) MG/3ML nebulizer solution 3 mL (3 mLs  Nebulization Given 10/30/17 0501)  sodium chloride 0.9 % bolus 1,000 mL (0 mLs Intravenous Stopped 10/30/17 0605)  iopamidol (ISOVUE-300) 61 % injection 75 mL (75 mLs Intravenous Contrast Given 10/30/17 0639)  ibuprofen (ADVIL,MOTRIN) tablet 400 mg (400 mg Oral Given 10/30/17 0738)  LORazepam (ATIVAN) injection 1 mg (1 mg Intravenous Given 10/30/17 0738)  ipratropium-albuterol (DUONEB) 0.5-2.5 (3) MG/3ML nebulizer solution 3 mL (3 mLs Nebulization Given 10/30/17 0740)     Initial Impression / Assessment and Plan / ED Course  I have reviewed the triage vital signs and the nursing notes.  Pertinent labs & imaging results that were available during my care of the patient were reviewed by me and considered in my medical decision making (see chart for details).  Clinical Course as of Oct 30 749  Wed Oct 30, 2017  5621 CT scan reviewed and shows no evidence of pneumonia,.  Patient persistently tachycardic with fever treatment and 2 L of fluid.  He now endorses daily alcohol use with 8-10 beers daily.  He has never had alcohol withdrawal.  He reports that his last alcohol intake was at  12:30 AM.  Given his history, alcohol withdrawal as a cause of tachycardia is a consideration.  CIWA protocol initiated.  Patient was given a milligram of Ativan.  EtOH pending.   [CH]    Clinical Course User Index [CH] Geneen Dieter, Mayer Masker, MD    Presents with shortness of breath.  He is tachycardic in the 130s, mildly tachypneic, does have some wheezing on exam.  Temperature 101.2.  He is a smoker.  Saturations include pneumonia, pneumothorax, PE.  Patient was given fluids.  Lactate is 1.99.  He is not hypotensive.  No evidence of sepsis at this time.  Normal white count.  D-dimer is negative and he is otherwise low risk.  No evidence of pneumonia on chest x-ray.  After 2 L of fluid and appropriate fever control, patient is persistently tachycardic in the 120s.  CT scan obtained to evaluate for other potential etiology.  Extensive additional history taken.  See above.  Initial CIWA score is 7.  Patient given Ativan and 1 additional liter of fluids.  He will need reevaluation.  Bedside ultrasound does not show any pericardial effusion or cardiac tamponade.  He needs to have some inappropriate tachycardia.  Signed out pending additional fluids and Ativan as well as reevaluation.  Signed out to oncoming provider.  Final Clinical Impressions(s) / ED Diagnoses   Final diagnoses:  None    ED Discharge Orders    None       Tavari Loadholt, Mayer Masker, MD 10/30/17 [phone]

## 2017-10-30 NOTE — ED Notes (Signed)
CRITICAL VALUE ALERT  Critical Value: IStat Lactic Acid  Date & Time Notied:  10/30/17 0739  Provider Notified: Dr. Wilkie Aye  Orders Received/Actions taken: None at this time

## 2019-03-28 IMAGING — CR DG CHEST 1V PORT
1 series · 1 of 1 positions shown · non-contrast
Comparison: 05/13/2016

CLINICAL DATA: Increased shortness of breath and chest tightness
starting today.

EXAM:
PORTABLE CHEST 1 VIEW

[portable]
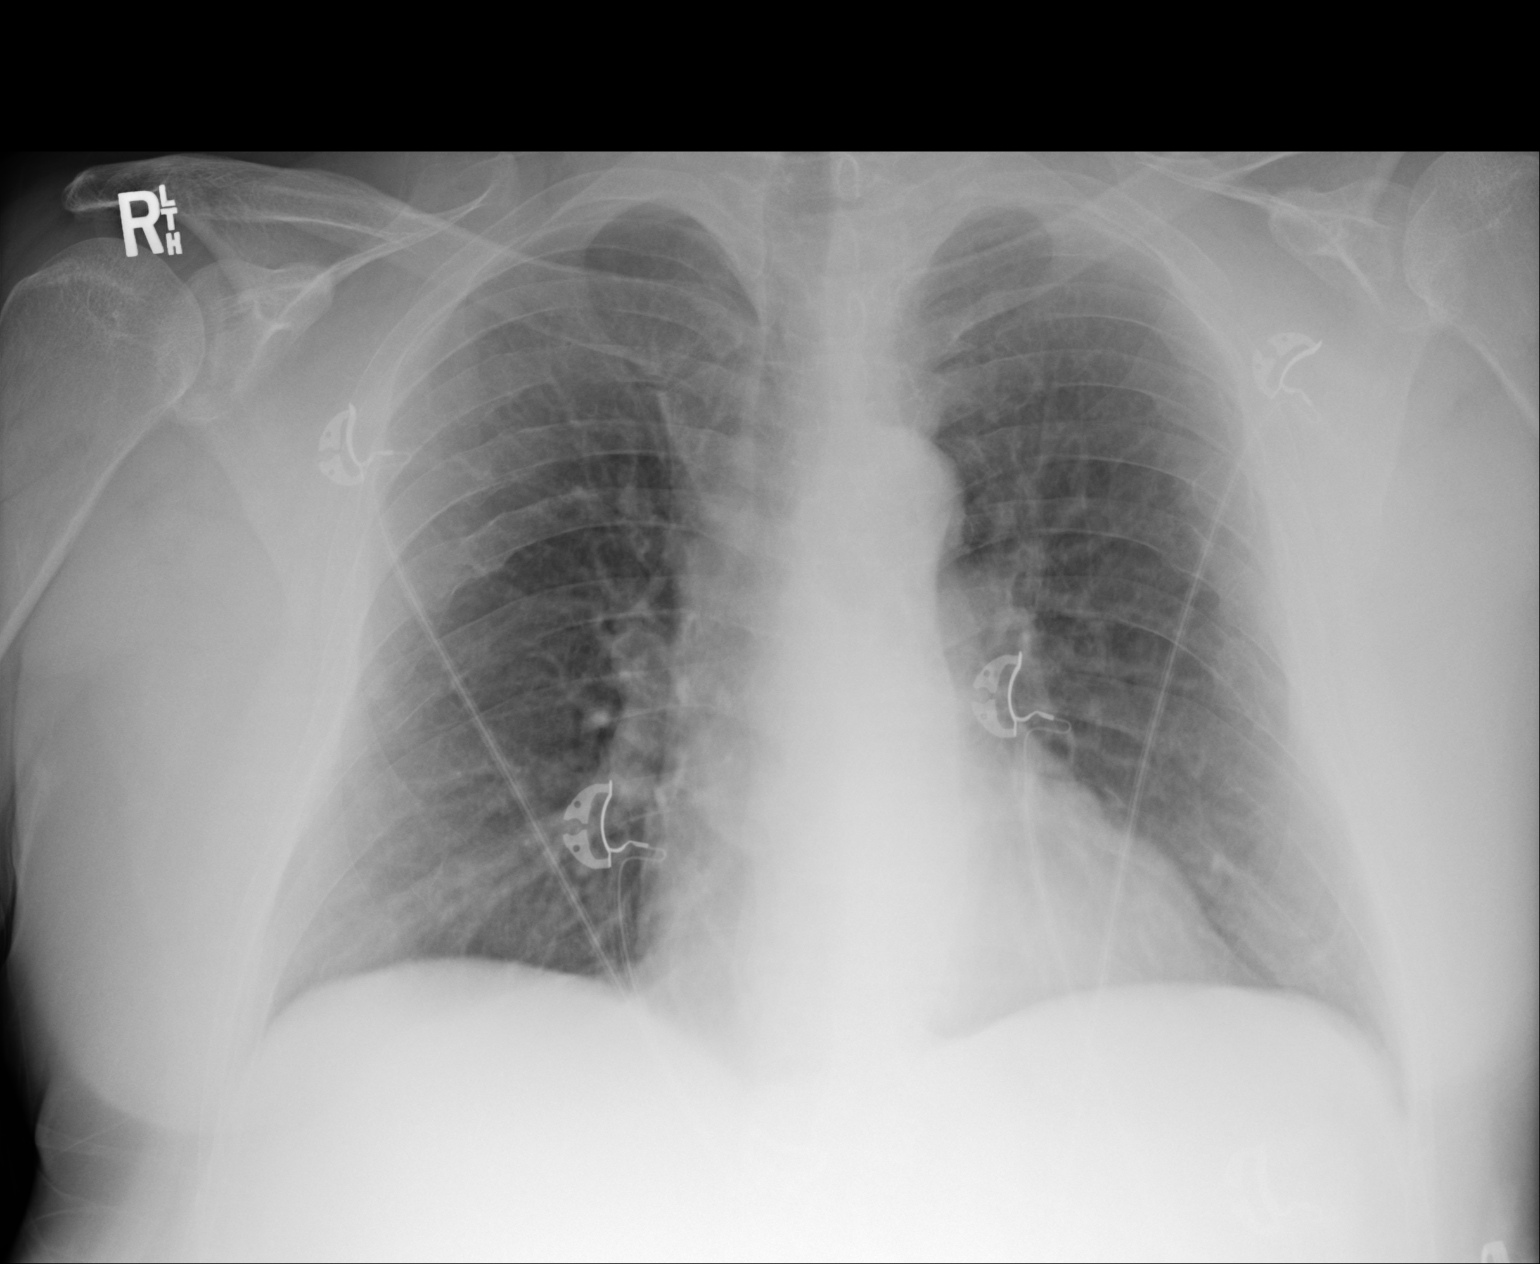

[1 of 1 positions shown; findings below may reference images not displayed]

FINDINGS: Heart size and pulmonary vascularity are normal for technique.
Slightly shallow inspiration. No airspace disease or consolidation
in the lungs. No blunting of costophrenic angles. No pneumothorax.
Old bilateral rib fractures.
IMPRESSION: Shallow inspiration.  No evidence of active pulmonary disease.

## 2020-08-01 ENCOUNTER — Ambulatory Visit: Payer: Self-pay

## 2021-03-15 ENCOUNTER — Encounter: Payer: Self-pay | Admitting: Internal Medicine

## 2021-05-05 ENCOUNTER — Ambulatory Visit: Payer: Self-pay

## 2021-05-20 ENCOUNTER — Emergency Department (HOSPITAL_COMMUNITY): Payer: Medicare Other

## 2021-05-20 ENCOUNTER — Encounter (HOSPITAL_COMMUNITY): Payer: Self-pay | Admitting: Emergency Medicine

## 2021-05-20 ENCOUNTER — Other Ambulatory Visit: Payer: Self-pay

## 2021-05-20 ENCOUNTER — Inpatient Hospital Stay (HOSPITAL_COMMUNITY)
Admission: EM | Admit: 2021-05-20 | Discharge: 2021-05-22 | DRG: 194 | Disposition: A | Discharge status: Home / Self Care | Payer: Medicare Other | Attending: Internal Medicine | Admitting: Internal Medicine

## 2021-05-20 ENCOUNTER — Inpatient Hospital Stay (HOSPITAL_COMMUNITY): Payer: Medicare Other

## 2021-05-20 DIAGNOSIS — J101 Influenza due to other identified influenza virus with other respiratory manifestations: Secondary | ICD-10-CM | POA: Diagnosis present

## 2021-05-20 DIAGNOSIS — F419 Anxiety disorder, unspecified: Secondary | ICD-10-CM | POA: Diagnosis present

## 2021-05-20 DIAGNOSIS — S0101XA Laceration without foreign body of scalp, initial encounter: Secondary | ICD-10-CM | POA: Diagnosis present

## 2021-05-20 DIAGNOSIS — J111 Influenza due to unidentified influenza virus with other respiratory manifestations: Secondary | ICD-10-CM

## 2021-05-20 DIAGNOSIS — R6520 Severe sepsis without septic shock: Secondary | ICD-10-CM

## 2021-05-20 DIAGNOSIS — Z20822 Contact with and (suspected) exposure to covid-19: Secondary | ICD-10-CM | POA: Diagnosis present

## 2021-05-20 DIAGNOSIS — Z7952 Long term (current) use of systemic steroids: Secondary | ICD-10-CM | POA: Diagnosis not present

## 2021-05-20 DIAGNOSIS — G8929 Other chronic pain: Secondary | ICD-10-CM | POA: Diagnosis present

## 2021-05-20 DIAGNOSIS — Z720 Tobacco use: Secondary | ICD-10-CM | POA: Diagnosis present

## 2021-05-20 DIAGNOSIS — N2889 Other specified disorders of kidney and ureter: Secondary | ICD-10-CM

## 2021-05-20 DIAGNOSIS — M549 Dorsalgia, unspecified: Secondary | ICD-10-CM | POA: Diagnosis present

## 2021-05-20 DIAGNOSIS — Z833 Family history of diabetes mellitus: Secondary | ICD-10-CM

## 2021-05-20 DIAGNOSIS — R55 Syncope and collapse: Secondary | ICD-10-CM

## 2021-05-20 DIAGNOSIS — Z79899 Other long term (current) drug therapy: Secondary | ICD-10-CM

## 2021-05-20 DIAGNOSIS — Z7982 Long term (current) use of aspirin: Secondary | ICD-10-CM | POA: Diagnosis not present

## 2021-05-20 DIAGNOSIS — I10 Essential (primary) hypertension: Secondary | ICD-10-CM | POA: Diagnosis present

## 2021-05-20 DIAGNOSIS — E871 Hypo-osmolality and hyponatremia: Secondary | ICD-10-CM | POA: Diagnosis present

## 2021-05-20 DIAGNOSIS — F17210 Nicotine dependence, cigarettes, uncomplicated: Secondary | ICD-10-CM | POA: Diagnosis present

## 2021-05-20 DIAGNOSIS — A419 Sepsis, unspecified organism: Secondary | ICD-10-CM

## 2021-05-20 DIAGNOSIS — W1839XA Other fall on same level, initial encounter: Secondary | ICD-10-CM | POA: Diagnosis present

## 2021-05-20 DIAGNOSIS — I151 Hypertension secondary to other renal disorders: Secondary | ICD-10-CM

## 2021-05-20 DIAGNOSIS — Z8249 Family history of ischemic heart disease and other diseases of the circulatory system: Secondary | ICD-10-CM | POA: Diagnosis not present

## 2021-05-20 DIAGNOSIS — M5440 Lumbago with sciatica, unspecified side: Secondary | ICD-10-CM | POA: Diagnosis not present

## 2021-05-20 DIAGNOSIS — E876 Hypokalemia: Secondary | ICD-10-CM | POA: Diagnosis present

## 2021-05-20 DIAGNOSIS — F1010 Alcohol abuse, uncomplicated: Secondary | ICD-10-CM | POA: Diagnosis present

## 2021-05-20 DIAGNOSIS — R6510 Systemic inflammatory response syndrome (SIRS) of non-infectious origin without acute organ dysfunction: Secondary | ICD-10-CM | POA: Diagnosis not present

## 2021-05-20 DIAGNOSIS — E861 Hypovolemia: Secondary | ICD-10-CM | POA: Diagnosis present

## 2021-05-20 DIAGNOSIS — Z8673 Personal history of transient ischemic attack (TIA), and cerebral infarction without residual deficits: Secondary | ICD-10-CM

## 2021-05-20 LAB — BASIC METABOLIC PANEL
Anion gap: 11 (ref 5–15)
BUN: 9 mg/dL (ref 6–20)
CO2: 22 mmol/L (ref 22–32)
Calcium: 8.8 mg/dL — ABNORMAL LOW (ref 8.9–10.3)
Chloride: 95 mmol/L — ABNORMAL LOW (ref 98–111)
Creatinine, Ser: 0.73 mg/dL (ref 0.61–1.24)
GFR, Estimated: >60 mL/min (ref >60)
Glucose, Bld: 115 mg/dL — ABNORMAL HIGH (ref 70–99)
Potassium: 3.4 mmol/L — ABNORMAL LOW (ref 3.5–5.1)
Sodium: 128 mmol/L — ABNORMAL LOW (ref 135–145)

## 2021-05-20 LAB — URINALYSIS, ROUTINE W REFLEX MICROSCOPIC
Bacteria, UA: NONE SEEN
Bilirubin Urine: NEGATIVE
Glucose, UA: 50 mg/dL — AB
Ketones, ur: NEGATIVE mg/dL
Leukocytes,Ua: NEGATIVE
Nitrite: NEGATIVE
Protein, ur: NEGATIVE mg/dL
Specific Gravity, Urine: 1.013 (ref 1.005–1.030)
pH: 6 (ref 5.0–8.0)

## 2021-05-20 LAB — ECHOCARDIOGRAM COMPLETE
Height: 70 in
S' Lateral: 2.8 cm
Weight: 3472 oz

## 2021-05-20 LAB — ETHANOL: Alcohol, Ethyl (B): <10 mg/dL (ref <10)

## 2021-05-20 LAB — CBC
HCT: 37.6 % — ABNORMAL LOW (ref 39.0–52.0)
Hemoglobin: 12.7 g/dL — ABNORMAL LOW (ref 13.0–17.0)
MCH: 34.6 pg — ABNORMAL HIGH (ref 26.0–34.0)
MCHC: 33.8 g/dL (ref 30.0–36.0)
MCV: 102.5 fL — ABNORMAL HIGH (ref 80.0–100.0)
Platelets: 226 10*3/uL (ref 150–400)
RBC: 3.67 MIL/uL — ABNORMAL LOW (ref 4.22–5.81)
RDW: 12.9 % (ref 11.5–15.5)
WBC: 10 10*3/uL (ref 4.0–10.5)
nRBC: 0 % (ref 0.0–0.2)

## 2021-05-20 LAB — CBG MONITORING, ED: Glucose-Capillary: 124 mg/dL — ABNORMAL HIGH (ref 70–99)

## 2021-05-20 LAB — RESP PANEL BY RT-PCR (FLU A&B, COVID) ARPGX2
Influenza A by PCR: POSITIVE — AB
Influenza B by PCR: NEGATIVE
SARS Coronavirus 2 by RT PCR: NEGATIVE

## 2021-05-20 LAB — LACTIC ACID, PLASMA
Lactic Acid, Venous: 1.4 mmol/L (ref 0.5–1.9)
Lactic Acid, Venous: 1.2 mmol/L (ref 0.5–1.9)

## 2021-05-20 LAB — D-DIMER, QUANTITATIVE: D-Dimer, Quant: 0.41 ug/mL-FEU (ref 0.00–0.50)

## 2021-05-20 LAB — PROTIME-INR
INR: 1 (ref 0.8–1.2)
Prothrombin Time: 13.4 seconds (ref 11.4–15.2)

## 2021-05-20 LAB — APTT: aPTT: 28 seconds (ref 24–36)

## 2021-05-20 LAB — TROPONIN I (HIGH SENSITIVITY)
Troponin I (High Sensitivity): 3 ng/L (ref <18)
Troponin I (High Sensitivity): 4 ng/L (ref <18)

## 2021-05-20 LAB — PHOSPHORUS: Phosphorus: 2.9 mg/dL (ref 2.5–4.6)

## 2021-05-20 LAB — MAGNESIUM: Magnesium: 1.6 mg/dL — ABNORMAL LOW (ref 1.7–2.4)

## 2021-05-20 MED ORDER — BUDESONIDE 0.25 MG/2ML IN SUSP
0.2500 mg | Freq: Two times a day (BID) | RESPIRATORY_TRACT | Status: DC
  Administered 2021-05-20 – 2021-05-22 (×5): 0.25 mg via RESPIRATORY_TRACT
  Filled 2021-05-20 (×5): qty 2

## 2021-05-20 MED ORDER — LACTATED RINGERS IV BOLUS
1000.0000 mL | Freq: Once | INTRAVENOUS | Status: AC
  Administered 2021-05-20: 1000 mL via INTRAVENOUS

## 2021-05-20 MED ORDER — LORAZEPAM 2 MG/ML IJ SOLN: 0.0000 mg | Freq: Two times a day (BID) | INTRAMUSCULAR | Status: DC

## 2021-05-20 MED ORDER — METOCLOPRAMIDE HCL 5 MG/ML IJ SOLN
10.0000 mg | Freq: Once | INTRAMUSCULAR | Status: AC
  Administered 2021-05-20: 10 mg via INTRAVENOUS
  Filled 2021-05-20: qty 2

## 2021-05-20 MED ORDER — LACTATED RINGERS IV SOLN: INTRAVENOUS | Status: DC

## 2021-05-20 MED ORDER — ONDANSETRON HCL 4 MG PO TABS: 4.0000 mg | ORAL_TABLET | Freq: Four times a day (QID) | ORAL | Status: DC | PRN

## 2021-05-20 MED ORDER — SODIUM CHLORIDE 0.9 % IV SOLN
2.0000 g | Freq: Once | INTRAVENOUS | Status: AC
  Administered 2021-05-20: 2 g via INTRAVENOUS
  Filled 2021-05-20: qty 2

## 2021-05-20 MED ORDER — ONDANSETRON HCL 4 MG/2ML IJ SOLN: 4.0000 mg | Freq: Four times a day (QID) | INTRAMUSCULAR | Status: DC | PRN

## 2021-05-20 MED ORDER — ACETAMINOPHEN 325 MG PO TABS
650.0000 mg | ORAL_TABLET | Freq: Once | ORAL | Status: AC
  Administered 2021-05-20: 650 mg via ORAL
  Filled 2021-05-20: qty 2

## 2021-05-20 MED ORDER — METHYLPREDNISOLONE SODIUM SUCC 40 MG IJ SOLR
40.0000 mg | Freq: Two times a day (BID) | INTRAMUSCULAR | Status: DC
  Administered 2021-05-20 – 2021-05-22 (×5): 40 mg via INTRAVENOUS
  Filled 2021-05-20 (×5): qty 1

## 2021-05-20 MED ORDER — KETOROLAC TROMETHAMINE 30 MG/ML IJ SOLN
30.0000 mg | Freq: Once | INTRAMUSCULAR | Status: AC
  Administered 2021-05-20: 30 mg via INTRAVENOUS
  Filled 2021-05-20: qty 1

## 2021-05-20 MED ORDER — ACETAMINOPHEN 325 MG PO TABS
650.0000 mg | ORAL_TABLET | Freq: Four times a day (QID) | ORAL | Status: DC | PRN
  Administered 2021-05-21: 650 mg via ORAL
  Filled 2021-05-20: qty 2

## 2021-05-20 MED ORDER — THIAMINE HCL 100 MG/ML IJ SOLN: 100.0000 mg | Freq: Every day | INTRAMUSCULAR | Status: DC

## 2021-05-20 MED ORDER — LORAZEPAM 2 MG/ML IJ SOLN: 1.0000 mg | INTRAMUSCULAR | Status: DC | PRN

## 2021-05-20 MED ORDER — ALPRAZOLAM 1 MG PO TABS
1.0000 mg | ORAL_TABLET | Freq: Every day | ORAL | Status: DC
  Administered 2021-05-20 – 2021-05-21 (×2): 1 mg via ORAL
  Filled 2021-05-20 (×2): qty 1

## 2021-05-20 MED ORDER — LORAZEPAM 1 MG PO TABS: 1.0000 mg | ORAL_TABLET | ORAL | Status: DC | PRN

## 2021-05-20 MED ORDER — GUAIFENESIN ER 600 MG PO TB12
1200.0000 mg | ORAL_TABLET | Freq: Two times a day (BID) | ORAL | Status: DC
  Administered 2021-05-20 – 2021-05-22 (×5): 1200 mg via ORAL
  Filled 2021-05-20 (×5): qty 2

## 2021-05-20 MED ORDER — OXYCODONE HCL 5 MG PO TABS
15.0000 mg | ORAL_TABLET | Freq: Four times a day (QID) | ORAL | Status: DC | PRN
  Administered 2021-05-20 – 2021-05-22 (×6): 15 mg via ORAL
  Filled 2021-05-20 (×6): qty 3

## 2021-05-20 MED ORDER — MAGNESIUM SULFATE 4 GM/100ML IV SOLN
4.0000 g | Freq: Once | INTRAVENOUS | Status: AC
  Administered 2021-05-20: 4 g via INTRAVENOUS
  Filled 2021-05-20: qty 100

## 2021-05-20 MED ORDER — SODIUM CHLORIDE 0.9 % IV BOLUS
1000.0000 mL | Freq: Once | INTRAVENOUS | Status: AC
  Administered 2021-05-20: 1000 mL via INTRAVENOUS

## 2021-05-20 MED ORDER — ADULT MULTIVITAMIN W/MINERALS CH
1.0000 | ORAL_TABLET | Freq: Every day | ORAL | Status: DC
  Administered 2021-05-20 – 2021-05-22 (×3): 1 via ORAL
  Filled 2021-05-20 (×3): qty 1

## 2021-05-20 MED ORDER — VANCOMYCIN HCL 1000 MG/200ML IV SOLN: 1000.0000 mg | Freq: Once | INTRAVENOUS | Status: DC

## 2021-05-20 MED ORDER — ENOXAPARIN SODIUM 40 MG/0.4ML IJ SOSY
40.0000 mg | PREFILLED_SYRINGE | INTRAMUSCULAR | Status: DC
  Administered 2021-05-20 – 2021-05-22 (×3): 40 mg via SUBCUTANEOUS
  Filled 2021-05-20 (×3): qty 0.4

## 2021-05-20 MED ORDER — ASPIRIN EC 81 MG PO TBEC
81.0000 mg | DELAYED_RELEASE_TABLET | Freq: Every day | ORAL | Status: DC
  Administered 2021-05-20 – 2021-05-22 (×3): 81 mg via ORAL
  Filled 2021-05-20 (×3): qty 1

## 2021-05-20 MED ORDER — THIAMINE HCL 100 MG PO TABS
100.0000 mg | ORAL_TABLET | Freq: Every day | ORAL | Status: DC
  Administered 2021-05-20 – 2021-05-22 (×3): 100 mg via ORAL
  Filled 2021-05-20 (×3): qty 1

## 2021-05-20 MED ORDER — IPRATROPIUM-ALBUTEROL 0.5-2.5 (3) MG/3ML IN SOLN
3.0000 mL | Freq: Four times a day (QID) | RESPIRATORY_TRACT | Status: DC
  Administered 2021-05-20 – 2021-05-22 (×8): 3 mL via RESPIRATORY_TRACT
  Filled 2021-05-20 (×8): qty 3

## 2021-05-20 MED ORDER — POTASSIUM CHLORIDE IN NACL 20-0.9 MEQ/L-% IV SOLN
INTRAVENOUS | Status: DC
  Filled 2021-05-20: qty 1000

## 2021-05-20 MED ORDER — METRONIDAZOLE 500 MG/100ML IV SOLN
500.0000 mg | Freq: Once | INTRAVENOUS | Status: AC
  Administered 2021-05-20: 500 mg via INTRAVENOUS
  Filled 2021-05-20: qty 100

## 2021-05-20 MED ORDER — SODIUM CHLORIDE 0.9 % IV SOLN: 2.0000 g | Freq: Once | INTRAVENOUS | Status: DC

## 2021-05-20 MED ORDER — IOHEXOL 300 MG/ML  SOLN
75.0000 mL | Freq: Once | INTRAMUSCULAR | Status: AC | PRN
  Administered 2021-05-20: 75 mL via INTRAVENOUS

## 2021-05-20 MED ORDER — ACETAMINOPHEN 650 MG RE SUPP: 650.0000 mg | Freq: Four times a day (QID) | RECTAL | Status: DC | PRN

## 2021-05-20 MED ORDER — LORAZEPAM 2 MG/ML IJ SOLN
0.0000 mg | Freq: Four times a day (QID) | INTRAMUSCULAR | Status: AC
Start: 2021-05-20 — End: 2021-05-22
  Administered 2021-05-20: 1 mg via INTRAVENOUS
  Filled 2021-05-20: qty 2

## 2021-05-20 MED ORDER — HYDROCODONE BIT-HOMATROP MBR 5-1.5 MG/5ML PO SOLN
5.0000 mL | ORAL | Status: DC | PRN
  Administered 2021-05-20 – 2021-05-22 (×6): 5 mL via ORAL
  Filled 2021-05-20 (×6): qty 5

## 2021-05-20 MED ORDER — LACTATED RINGERS IV BOLUS (SEPSIS)
1000.0000 mL | Freq: Once | INTRAVENOUS | Status: AC
  Administered 2021-05-20: 1000 mL via INTRAVENOUS

## 2021-05-20 MED ORDER — DIPHENHYDRAMINE HCL 50 MG/ML IJ SOLN
25.0000 mg | Freq: Once | INTRAMUSCULAR | Status: AC
  Administered 2021-05-20: 25 mg via INTRAVENOUS
  Filled 2021-05-20: qty 1

## 2021-05-20 MED ORDER — VANCOMYCIN HCL 2000 MG/400ML IV SOLN
2000.0000 mg | Freq: Once | INTRAVENOUS | Status: AC
  Administered 2021-05-20: 2000 mg via INTRAVENOUS
  Filled 2021-05-20: qty 400

## 2021-05-20 MED ORDER — FOLIC ACID 1 MG PO TABS
1.0000 mg | ORAL_TABLET | Freq: Every day | ORAL | Status: DC
  Administered 2021-05-20 – 2021-05-22 (×3): 1 mg via ORAL
  Filled 2021-05-20 (×3): qty 1

## 2021-05-20 MED ORDER — SODIUM CHLORIDE 0.9 % IV SOLN
2.0000 g | Freq: Three times a day (TID) | INTRAVENOUS | Status: DC
  Administered 2021-05-20 – 2021-05-21 (×4): 2 g via INTRAVENOUS
  Filled 2021-05-20 (×5): qty 2

## 2021-05-20 MED ORDER — OSELTAMIVIR PHOSPHATE 75 MG PO CAPS
75.0000 mg | ORAL_CAPSULE | Freq: Once | ORAL | Status: AC
  Administered 2021-05-20: 75 mg via ORAL
  Filled 2021-05-20: qty 1

## 2021-05-20 MED ORDER — OSELTAMIVIR PHOSPHATE 75 MG PO CAPS
75.0000 mg | ORAL_CAPSULE | Freq: Two times a day (BID) | ORAL | Status: DC
  Administered 2021-05-20 – 2021-05-22 (×4): 75 mg via ORAL
  Filled 2021-05-20 (×4): qty 1

## 2021-05-20 MED ORDER — VANCOMYCIN HCL 1000 MG/200ML IV SOLN
1000.0000 mg | Freq: Two times a day (BID) | INTRAVENOUS | Status: DC
  Administered 2021-05-20 – 2021-05-21 (×2): 1000 mg via INTRAVENOUS
  Filled 2021-05-20 (×2): qty 200

## 2021-05-20 MED ORDER — DEXAMETHASONE SODIUM PHOSPHATE 10 MG/ML IJ SOLN
10.0000 mg | Freq: Once | INTRAMUSCULAR | Status: AC
  Administered 2021-05-20: 10 mg via INTRAVENOUS
  Filled 2021-05-20: qty 1

## 2021-05-20 NOTE — ED Provider Notes (Signed)
[facility] EMERGENCY DEPARTMENT Provider Note   CSN: [id number] Arrival date & time: 05/20/21  0007     History Chief Complaint  Patient presents with   Loss of Consciousness    Anthony Hoover is a 55 y.o. male.  Patient here with family.  States that he dealing with a cough for the past 10 days.  Not able to cough up anything.  Does have rib pain from coughing.  States he is having frequent episodes of passing out when he coughs.  Daughter at bedside reports this is happening 5-6 times daily.  After a coughing fit, his eyes rolled back and he loses consciousness for 10 to 15 seconds per his daughter.  After this he is slow to respond for several minutes.  There is no tongue biting or incontinence.  No history of seizures.  Daughter states his arms stiffen up when he has these episodes.  He had episode on Monday when he hit his head and sustained a laceration.  He was not seen for this laceration.  He had a persistent headache since then as well as right-sided rib pain and pain with coughing.  Does feel short of breath.  Denies any abdominal pain, nausea or vomiting. Drinks about 8 beers daily but did not have any today.  Denies any other drug use.  Tested negative for COVID and flu several days ago. Does not think he had a fever at home. He has never had issues with passing out before seizures  The history is provided by the patient.  Loss of Consciousness Associated symptoms: dizziness, headaches and shortness of breath   Associated symptoms: no chest pain, no fever, no nausea, no vomiting and no weakness       Past Medical History:  Diagnosis Date   Alcohol abuse    Anxiety disorder    Back pain    pain management by Dr. Cindie Laroche   Compression fracture    T-spine   CVA (cerebral infarction)    2009   Hypertension    Insomnia    Tobacco abuse     Patient Active Problem List   Diagnosis Date Noted   Diastasis recti 07/23/2017   Seizure due to alcohol withdrawal ([place])  05/12/2016   Seizure ([place]) 05/12/2016   Back pain    CVA (cerebral infarction)    Anxiety disorder    Fall from tree 09/25/2011   Multiple fractures of ribs of left side 09/25/2011   Right pulmonary contusion 09/25/2011   Hypertension 09/25/2011   Chronic back pain 09/25/2011   Wedge compression fracture of T8 vertebra ([place]) 09/25/2011   Wedge compression fracture of T10 vertebra ([place]) 09/25/2011   Sternal fracture 09/25/2011   Fracture of transverse process of thoracic vertebra ([place]) 09/25/2011   Heavy alcohol use 09/25/2011   Acute blood loss anemia 09/25/2011   History of CVA (cerebrovascular accident) 09/25/2011   Tobacco use 09/25/2011    Past Surgical History:  Procedure Laterality Date   UMBILICAL HERNIA REPAIR         Family History  Problem Relation Age of Onset   Diabetes Mother    Cancer Father    Heart disease Father     Social History   Tobacco Use   Smoking status: Every Day    Packs/day: 0.00    Types: Cigarettes   Smokeless tobacco: Never  Vaping Use   Vaping Use: Never used  Substance Use Topics   Alcohol use: Yes    Alcohol/week: 8.0  standard drinks    Types: 8 Cans of beer per week    Comment: daily   Drug use: No    Home Medications Prior to Admission medications   Medication Sig Start Date End Date Taking? Authorizing Provider  albuterol (PROVENTIL HFA;VENTOLIN HFA) 108 (90 Base) MCG/ACT inhaler Inhale 1-2 puffs into the lungs every 6 (six) hours as needed for wheezing or shortness of breath. 10/30/17   Nat Christen, MD  ALPRAZolam Duanne Moron) 1 MG tablet Take 1 mg by mouth at bedtime.     [provider]  aspirin 325 MG tablet Take 650 mg by mouth daily.    [provider]  lisinopril-hydrochlorothiazide (PRINZIDE,ZESTORETIC) 20-12.5 MG per tablet Take 1 tablet by mouth at bedtime.     [provider]  methadone (DOLOPHINE) 10 MG tablet Take 20 mg by mouth every 8 (eight) hours as needed for pain.    [provider]  pantoprazole (PROTONIX) 40 MG tablet Take 1 tablet by mouth daily. 10/02/17   [provider]  predniSONE (DELTASONE) 10 MG tablet Take 2 tablets (20 mg total) by mouth daily. 10/30/17   Nat Christen, MD  tamsulosin (FLOMAX) 0.4 MG CAPS capsule Take 1 capsule by mouth daily. 10/18/15   [provider]    Allergies    Patient has no known allergies.  Review of Systems   Review of Systems  Constitutional:  Positive for fatigue. Negative for activity change, appetite change and fever.  HENT:  Positive for congestion, rhinorrhea and sore throat.   Eyes:  Negative for visual disturbance.  Respiratory:  Positive for cough and shortness of breath. Negative for chest tightness.   Cardiovascular:  Positive for syncope. Negative for chest pain.  Gastrointestinal:  Negative for abdominal pain, nausea and vomiting.  Genitourinary:  Negative for dysuria and hematuria.  Musculoskeletal:  Negative for arthralgias and myalgias.  Skin:  Negative for rash.  Neurological:  Positive for dizziness, light-headedness and headaches. Negative for weakness.   all other systems are negative except as noted in the HPI and PMH.   Physical Exam Updated Vital Signs BP 119/82 (BP Location: Left Arm)   Pulse (!) 132   Temp 99.3 F (37.4 C) (Oral)   Resp (!) 23   Ht 5\' 10"  (1.778 m)   Wt 98.4 kg   SpO2 94%   BMI 31.14 kg/m   Physical Exam Vitals and nursing note reviewed.  Constitutional:      General: He is not in acute distress.    Appearance: He is well-developed. He is obese. He is ill-appearing.     Comments: Feels warm, tachycardic to the 130s  HENT:     Head: Normocephalic.     Comments: Healing laceration/avulsion of the posterior scalp    Mouth/Throat:     Pharynx: No oropharyngeal exudate.  Eyes:     Conjunctiva/sclera: Conjunctivae normal.     Pupils: Pupils are equal, round, and reactive to light.  Neck:     Comments: No meningismus. No C-spine  tenderness Cardiovascular:     Rate and Rhythm: Regular rhythm. Tachycardia present.     Heart sounds: Normal heart sounds. No murmur heard. Pulmonary:     Effort: Pulmonary effort is normal. No respiratory distress.     Breath sounds: Normal breath sounds.     Comments: R rib tenderness Chest:     Chest wall: Tenderness present.  Abdominal:     Palpations: Abdomen is soft.     Tenderness: There is  no abdominal tenderness. There is no guarding or rebound.  Musculoskeletal:        General: No tenderness. Normal range of motion.     Cervical back: Normal range of motion and neck supple.  Skin:    General: Skin is warm.     Capillary Refill: Capillary refill takes less than 2 seconds.  Neurological:     General: No focal deficit present.     Mental Status: He is alert and oriented to person, place, and time. Mental status is at baseline.     Cranial Nerves: No cranial nerve deficit.     Motor: No abnormal muscle tone.     Coordination: Coordination normal.     Comments:  5/5 strength throughout. CN 2-12 intact.Equal grip strength.   Psychiatric:        Behavior: Behavior normal.    ED Results / Procedures / Treatments   Labs (all labs ordered are listed, but only abnormal results are displayed) Labs Reviewed  RESP PANEL BY RT-PCR (FLU A&B, COVID) ARPGX2 - Abnormal; Notable for the following components:      Result Value   Influenza A by PCR POSITIVE (*)    All other components within normal limits  BASIC METABOLIC PANEL - Abnormal; Notable for the following components:   Sodium 128 (*)    Potassium 3.4 (*)    Chloride 95 (*)    Glucose, Bld 115 (*)    Calcium 8.8 (*)    All other components within normal limits  CBC - Abnormal; Notable for the following components:   RBC 3.67 (*)    Hemoglobin 12.7 (*)    HCT 37.6 (*)    MCV 102.5 (*)    MCH 34.6 (*)    All other components within normal limits  CBG MONITORING, ED - Abnormal; Notable for the following components:    Glucose-Capillary 124 (*)    All other components within normal limits  CULTURE, BLOOD (ROUTINE X 2)  CULTURE, BLOOD (ROUTINE X 2)  URINE CULTURE  LACTIC ACID, PLASMA  PROTIME-INR  APTT  ETHANOL  D-DIMER, QUANTITATIVE (NOT AT [facility])  URINALYSIS, ROUTINE W REFLEX MICROSCOPIC  LACTIC ACID, PLASMA  TROPONIN I (HIGH SENSITIVITY)  TROPONIN I (HIGH SENSITIVITY)    EKG EKG Interpretation  Date/Time:  Saturday May 20 2021 00:26:51 EST Ventricular Rate:  132 PR Interval:  142 QRS Duration: 94 QT Interval:  309 QTC Calculation: 458 R Axis:   56 Text Interpretation: Sinus tachycardia RSR' in V1 or V2, probably normal variant No significant change was found Confirmed by Glynn Octave [phone]) on 05/20/2021 12:48:44 AM  Radiology DG Ribs Unilateral W/Chest Right  Result Date: 05/20/2021 CLINICAL DATA:  Fall, chest pain EXAM: RIGHT RIBS AND CHEST - 3+ VIEW COMPARISON:  10/30/2017 FINDINGS: Multiple remote healed bilateral rib fractures are identified. No acute fracture is seen. Lungs are clear. No pneumothorax or pleural effusion. Cardiac size within normal limits. The pulmonary vascularity is normal. The subglottic airway is markedly narrowed in the transverse dimension, possibly related to airway or hypopharyngeal inflammation. IMPRESSION: No acute fracture. Marked narrowing of the subglottic airway. Electronically Signed   By: Helyn Numbers M.D.   On: 05/20/2021 02:04   CT HEAD WO CONTRAST  Result Date: 05/20/2021 CLINICAL DATA:  Dizziness EXAM: CT HEAD WITHOUT CONTRAST TECHNIQUE: Contiguous axial images were obtained from the base of the skull through the vertex without intravenous contrast. COMPARISON:  05/12/2016 FINDINGS: Brain: No evidence of acute infarction, hemorrhage, hydrocephalus, extra-axial collection  or mass lesion/mass effect. Mild atrophic changes are noted. Prior lacunar infarcts are noted in the corona radiata out on the right adjacent to the lateral ventricle as  well as within the basal ganglia on the left stable from the prior study. Vascular: No hyperdense vessel or unexpected calcification. Skull: Normal. Negative for fracture or focal lesion. Sinuses/Orbits: No acute finding. Other: Mild scalp swelling is noted posteriorly on the right consistent with recent injury. IMPRESSION: No acute intracranial abnormality noted. Chronic atrophic and ischemic changes are seen. Mild soft tissue swelling is noted posteriorly on the right related to the recent injury. Electronically Signed   By: Inez Catalina M.D.   On: 05/20/2021 02:23    Procedures .Critical Care Performed by: Ezequiel Essex, MD Authorized by: Ezequiel Essex, MD   Critical care provider statement:    Critical care time (minutes):  75   Critical care time was exclusive of:  Separately billable procedures and treating other patients   Critical care was necessary to treat or prevent imminent or life-threatening deterioration of the following conditions:  Sepsis   Critical care was time spent personally by me on the following activities:  Development of treatment plan with patient or surrogate, discussions with consultants, evaluation of patient's response to treatment, examination of patient, ordering and review of laboratory studies, ordering and review of radiographic studies, ordering and performing treatments and interventions, pulse oximetry, re-evaluation of patient's condition and review of old charts   Medications Ordered in ED Medications  sodium chloride 0.9 % bolus 1,000 mL (has no administration in time range)  lactated ringers infusion (has no administration in time range)  lactated ringers bolus 1,000 mL (has no administration in time range)  ceFEPIme (MAXIPIME) 2 g in sodium chloride 0.9 % 100 mL IVPB (has no administration in time range)  metroNIDAZOLE (FLAGYL) IVPB 500 mg (has no administration in time range)  vancomycin (VANCOREADY) IVPB 2000 mg/400 mL (has no administration in  time range)    ED Course  I have reviewed the triage vital signs and the nursing notes.  Pertinent labs & imaging results that were available during my care of the patient were reviewed by me and considered in my medical decision making (see chart for details).    MDM Rules/Calculators/A&P                           Coughing fits for the past 7-10 during coughing.  Sustained laceration to scalp several days ago.  He is tachycardic to the 130s.  No Brugada or prolonged QT.  Feels warm.  Code sepsis activated on arrival.  He was given broad-spectrum antibiotics and IV fluids after cultures were obtained.  Suspect likely vasovagal syncope. Did sustain head injury several days ago.  Patient given IV fluids and broad-spectrum antibiotics.  He was found to be fluid positive.  Labs show mild hyponatremia. He does abuse alcohol chronically, last drink yesterday.  Head CT is normal.  Chest x-ray shows no infiltrate but does show narrowing of subglottic airway.  Patient denies any throat pain or any difficulty swallowing or difficulty breathing. Posterior oropharynx appears normal He is in no respiratory distress.  Will give dose of steroids and obtain CT soft tissue neck  CT neck is normal. Likely artifact on Xray.  Remains tachycardic in 120s despite defervescence. May have some component of ETOH withdrawal. CIWA protocol initiated. D-dimer negative. Doubt PE, ACS.  With sepsis and persistent tachycardia will plan admission.  Will also need evaluation for recurrent syncope though vasovagal episodes are favored.  D/w Dr. Roderic Palau. Final Clinical Impression(s) / ED Diagnoses Final diagnoses:  Influenza  Sepsis with acute organ dysfunction without septic shock, due to unspecified organism, unspecified type ([place])  Recurrent syncope    Rx / DC Orders ED Discharge Orders     None        Chamia Schmutz, Annie Main, MD 05/20/21 1009

## 2021-05-20 NOTE — ED Triage Notes (Signed)
Pt states he has passed out "at least 10 times" over the past week and a half. Pt with npc cough and states that when he has coughing spells, he passes out. Pt with laceration to back of head from previous fall on Monday. Pt with c/o cough (seen at [facility] on wed and tested for Covid and Flu -both negative and started on prednisone), R rib pain (from fall), and headache.

## 2021-05-20 NOTE — Sepsis Progress Note (Signed)
Elink following Code Sepsis. 

## 2021-05-20 NOTE — H&P (Signed)
History and Physical    Anthony Hoover:[id number] DOB: 11-30-1965 DOA: 05/20/2021  PCP: Waldon Reining, MD  Patient coming from: Home  I have personally briefly reviewed patient's old medical records in [facility]  Chief Complaint: Cough with syncope  HPI: Anthony Hoover is a 55 y.o. male with medical history significant of alcohol use, tobacco use, anxiety, hypertension, presents to the hospital with a 10-day history of cough.  Patient states he has been feeling weak and unwell.  For the last several days, he is having episodes of passing out after having coughing spells.  This is occurring multiple times a day.  No family at bedside at this time.  Patient reports that his daughter told him that after a coughing spell, patient will pass out for a few seconds and then returns to his baseline mental status.  He is unaware of having any fevers at home.  Cough is nonproductive.  No apparent tongue biting or urinary/bladder incontinence.  He has not had any vomiting, diarrhea, dysuria.  He has had associated shortness of breath with cough.  ED Course: On evaluation in the emergency room, he is noted to be febrile with a temperature of 102.2.  He is also tachycardic with heart rate in the 120s to 130s.  Oxygen saturations are normal on room air.  He is mildly tachypneic.  Imaging of his chest did not indicate any underlying pneumonia.  CT imaging of head and neck did not show any deep tissue injuries.  He did test positive for influenza A.  He is mildly hyponatremic with a sodium of 128.  Lactic acid and troponins are normal.  EKG shows sinus tachycardia.  He has been referred to the hospital for admission.  Review of Systems: As per HPI otherwise 10 point review of systems negative.    Past Medical History:  Diagnosis Date   Alcohol abuse    Anxiety disorder    Back pain    pain management by Dr. Janna Arch   Compression fracture    T-spine   CVA (cerebral infarction)    2009    Hypertension    Insomnia    Tobacco abuse     Past Surgical History:  Procedure Laterality Date   UMBILICAL HERNIA REPAIR      Social History:  reports that he has been smoking cigarettes. He has never used smokeless tobacco. He reports current alcohol use of about 8.0 standard drinks per week. He reports that he does not use drugs.  No Known Allergies  Family History  Problem Relation Age of Onset   Diabetes Mother    Cancer Father    Heart disease Father     Prior to Admission medications   Medication Sig Start Date End Date Taking? Authorizing Provider  albuterol (PROVENTIL HFA;VENTOLIN HFA) 108 (90 Base) MCG/ACT inhaler Inhale 1-2 puffs into the lungs every 6 (six) hours as needed for wheezing or shortness of breath. 10/30/17   Donnetta Hutching, MD  ALPRAZolam Prudy Feeler) 1 MG tablet Take 1 mg by mouth at bedtime.     [provider]  aspirin 325 MG tablet Take 650 mg by mouth daily.    [provider]  lisinopril-hydrochlorothiazide (PRINZIDE,ZESTORETIC) 20-12.5 MG per tablet Take 1 tablet by mouth at bedtime.     [provider]    Physical Exam: Vitals:   05/20/21 0415 05/20/21 0430 05/20/21 0609 05/20/21 1000  BP: 121/72 113/69 104/64 (!) 139/91  Pulse: (!) 136 (!) 133 Marland Kitchen)  118 (!) 114  Resp: (!) 24 (!) 27 (!) 28 (!) 25  Temp:      TempSrc:      SpO2: 95% 95% 93% 95%  Weight:      Height:        Constitutional: NAD, calm, comfortable Eyes: PERRL, lids and conjunctivae normal ENMT: Mucous membranes are moist. Posterior pharynx clear of any exudate or lesions.Normal dentition.  Neck: normal, supple, no masses, no thyromegaly Respiratory: Fair air movement bilaterally with mild wheeze. Normal respiratory effort. No accessory muscle use.  Cardiovascular: S1, S2, tachycardic, no murmurs / rubs / gallops. No extremity edema. 2+ pedal pulses. No carotid bruits.  Abdomen: no tenderness, no masses palpated. No hepatosplenomegaly. Bowel sounds positive.   Musculoskeletal: no clubbing / cyanosis. No joint deformity upper and lower extremities. Good ROM, no contractures. Normal muscle tone.  Skin: no rashes, lesions, ulcers. No induration Neurologic: CN 2-12 grossly intact. Sensation intact, DTR normal. Strength 5/5 in all 4.  Psychiatric: Normal judgment and insight. Alert and oriented x 3. Normal mood.    Labs on Admission: I have personally reviewed following labs and imaging studies  CBC: Recent Labs  Lab 05/20/21 0108  WBC 10.0  HGB 12.7*  HCT 37.6*  MCV 102.5*  PLT 226   Basic Metabolic Panel: Recent Labs  Lab 05/20/21 0108  NA 128*  K 3.4*  CL 95*  CO2 22  GLUCOSE 115*  BUN 9  CREATININE 0.73  CALCIUM 8.8*   GFR: Estimated Creatinine Clearance: 121.3 mL/min (by C-G formula based on SCr of 0.73 mg/dL). Liver Function Tests: No results for input(s): AST, ALT, ALKPHOS, BILITOT, PROT, ALBUMIN in the last 168 hours. No results for input(s): LIPASE, AMYLASE in the last 168 hours. No results for input(s): AMMONIA in the last 168 hours. Coagulation Profile: Recent Labs  Lab 05/20/21 0108  INR 1.0   Cardiac Enzymes: No results for input(s): CKTOTAL, CKMB, CKMBINDEX, TROPONINI in the last 168 hours. BNP (last 3 results) No results for input(s): PROBNP in the last 8760 hours. HbA1C: No results for input(s): HGBA1C in the last 72 hours. CBG: Recent Labs  Lab 05/20/21 0105  GLUCAP 124*   Lipid Profile: No results for input(s): CHOL, HDL, LDLCALC, TRIG, CHOLHDL, LDLDIRECT in the last 72 hours. Thyroid Function Tests: No results for input(s): TSH, T4TOTAL, FREET4, T3FREE, THYROIDAB in the last 72 hours. Anemia Panel: No results for input(s): VITAMINB12, FOLATE, FERRITIN, TIBC, IRON, RETICCTPCT in the last 72 hours. Urine analysis:    Component Value Date/Time   COLORURINE STRAW (A) 10/30/2017 1052   APPEARANCEUR CLEAR 10/30/2017 1052   LABSPEC 1.020 10/30/2017 1052   PHURINE 5.0 10/30/2017 1052   GLUCOSEU  NEGATIVE 10/30/2017 1052   HGBUR NEGATIVE 10/30/2017 1052   BILIRUBINUR NEGATIVE 10/30/2017 1052   KETONESUR NEGATIVE 10/30/2017 1052   PROTEINUR NEGATIVE 10/30/2017 1052   UROBILINOGEN 0.2 09/24/2011 2146   NITRITE NEGATIVE 10/30/2017 1052   LEUKOCYTESUR NEGATIVE 10/30/2017 1052    Radiological Exams on Admission: DG Ribs Unilateral W/Chest Right  Result Date: 05/20/2021 CLINICAL DATA:  Fall, chest pain EXAM: RIGHT RIBS AND CHEST - 3+ VIEW COMPARISON:  10/30/2017 FINDINGS: Multiple remote healed bilateral rib fractures are identified. No acute fracture is seen. Lungs are clear. No pneumothorax or pleural effusion. Cardiac size within normal limits. The pulmonary vascularity is normal. The subglottic airway is markedly narrowed in the transverse dimension, possibly related to airway or hypopharyngeal inflammation. IMPRESSION: No acute fracture. Marked narrowing of the subglottic airway.  Electronically Signed   By: Helyn Numbers M.D.   On: 05/20/2021 02:04   CT HEAD WO CONTRAST  Result Date: 05/20/2021 CLINICAL DATA:  Dizziness EXAM: CT HEAD WITHOUT CONTRAST TECHNIQUE: Contiguous axial images were obtained from the base of the skull through the vertex without intravenous contrast. COMPARISON:  05/12/2016 FINDINGS: Brain: No evidence of acute infarction, hemorrhage, hydrocephalus, extra-axial collection or mass lesion/mass effect. Mild atrophic changes are noted. 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 Vascular: No hyperdense vessel or unexpected calcification. Skull: Normal. Negative for fracture or focal lesion. Sinuses/Orbits: No acute finding. Other: Mild scalp swelling is noted posteriorly on the right consistent with recent injury. IMPRESSION: No acute intracranial abnormality noted. Chronic atrophic and ischemic changes are seen. Mild soft tissue  swelling is noted posteriorly on the right related to the recent injury. Electronically Signed   By: Alcide Clever M.D.   On: 05/20/2021 02:23   CT Soft Tissue Neck W Contrast  Result Date: 05/20/2021 CLINICAL DATA:  55 year old male with possible subglottic stenosis on chest x-ray this morning. Dizziness. EXAM: CT NECK WITH CONTRAST TECHNIQUE: Multidetector CT imaging of the neck was performed using the standard protocol following the bolus administration of intravenous contrast. CONTRAST:  29mL OMNIPAQUE IOHEXOL 300 MG/ML  SOLN COMPARISON:  Chest radiographs 0127 hours.  Head CT 0202 hours. FINDINGS: Pharynx and larynx: Mild respiratory motion at the glottis, which appears to remain normal. No subglottic mass or stenosis. The visible trachea is within normal limits (mild respiratory motion). Mild supraglottic motion artifact also, but the epiglottis and pharyngeal soft tissue contours are normal. Negative parapharyngeal and retropharyngeal spaces. Salivary glands: Negative.  Negative sublingual space. Thyroid: Negative. Lymph nodes: No cervical lymphadenopathy. The upper limits of normal left level 2A lymph node (series 2, image 48), but other bilateral cervical nodes appear symmetric and within normal limits. No cystic or necrotic nodes. Vascular: Suboptimal intravascular contrast but the major vascular structures in the neck and at the skull base appear patent. There is bilateral carotid calcified atherosclerosis. Limited intracranial: Negative. Visualized orbits: Negative. Mastoids and visualized paranasal sinuses: Mild bilateral ethmoid sinus mucosal thickening. No visible sinus fluid levels. Visible tympanic cavities and mastoids are clear. Skeleton: Absent dentition. Chronic appearing T1 superior endplate compression deformity. No acute osseous abnormality identified. Upper chest: Negative lung apices, visible trachea, superior mediastinum. IMPRESSION: 1. Mild motion artifact but normal larynx. Negative  for mass or subglottic stenosis. Suspect artifact (closed glottis) on the comparison chest x-ray this morning. 2. Negative CT appearance of the neck; bilateral carotid calcified atherosclerosis. Electronically Signed   By: Odessa Fleming M.D.   On: 05/20/2021 07:46    EKG: Independently reviewed.  Sinus tachycardia  Assessment/Plan Principal Problem:   Syncope Active Problems:   Hypertension   Chronic back pain   Tobacco use   Anxiety disorder   Influenza A   Alcohol abuse   Hyponatremia   Hypokalemia   SIRS (systemic inflammatory response syndrome) (HCC)     Syncope -Suspect this is vasovagal related to prolonged coughing episodes. -Cardiac enzymes are normal.,  EKG also unrevealing -Check echocardiogram -Continue with cough medicine to avoid coughing triggers  Influenza A -Suspect this is precipitating event patient's symptoms -Treat supportively with Tamiflu, nebulizer treatments, mucolytic's, steroids.  SIRS -Related to influenza A -Continue IV fluids and other supportive measures -Blood cultures have  been sent -He was started on broad-spectrum antibiotics -We will discontinue next 24 hours if patient remains stable  Hyponatremia -Likely related to hypovolemia -Continue on normal saline and monitor  Alcohol abuse -Reports drinking approximately 8 beers per day -Reports that his last drink was on 11/17 -He is currently on CIWA protocol with Ativan  Chronic back pain -Reports that he takes oxycodone 15 mg 4 times a day, prescribed by his pain management specialist at [facility] -We will continue current treatments  Hypertension -Chronically on lisinopril/HCTZ -Hold for now since blood pressure borderline  Anxiety -Continue home dose of Xanax    DVT prophylaxis: Lovenox Code Status: Full code Family Communication: Discussed with patient, unable to reach daughter over the phone Disposition Plan: Discharge home once improved Consults called:    Admission status: Inpatient, telemetry  Erick Blinks MD [facility]   If 7PM-7AM, please contact night-coverage [email or web address]   05/20/2021, 10:50 AM

## 2021-05-20 NOTE — Plan of Care (Signed)
  Problem: Activity: Goal: Risk for activity intolerance will decrease Outcome: Progressing   Problem: Pain Managment: Goal: General experience of comfort will improve Outcome: Progressing   

## 2021-05-21 DIAGNOSIS — E871 Hypo-osmolality and hyponatremia: Secondary | ICD-10-CM

## 2021-05-21 LAB — CBC
HCT: 31.6 % — ABNORMAL LOW (ref 39.0–52.0)
Hemoglobin: 10.4 g/dL — ABNORMAL LOW (ref 13.0–17.0)
MCH: 34.1 pg — ABNORMAL HIGH (ref 26.0–34.0)
MCHC: 32.9 g/dL (ref 30.0–36.0)
MCV: 103.6 fL — ABNORMAL HIGH (ref 80.0–100.0)
Platelets: 202 10*3/uL (ref 150–400)
RBC: 3.05 MIL/uL — ABNORMAL LOW (ref 4.22–5.81)
RDW: 13.1 % (ref 11.5–15.5)
WBC: 9.5 10*3/uL (ref 4.0–10.5)
nRBC: 0 % (ref 0.0–0.2)

## 2021-05-21 LAB — COMPREHENSIVE METABOLIC PANEL
ALT: 50 U/L — ABNORMAL HIGH (ref 0–44)
AST: 60 U/L — ABNORMAL HIGH (ref 15–41)
Albumin: 3.1 g/dL — ABNORMAL LOW (ref 3.5–5.0)
Alkaline Phosphatase: 91 U/L (ref 38–126)
Anion gap: 9 (ref 5–15)
BUN: 8 mg/dL (ref 6–20)
CO2: 21 mmol/L — ABNORMAL LOW (ref 22–32)
Calcium: 8 mg/dL — ABNORMAL LOW (ref 8.9–10.3)
Chloride: 106 mmol/L (ref 98–111)
Creatinine, Ser: 0.69 mg/dL (ref 0.61–1.24)
GFR, Estimated: >60 mL/min (ref >60)
Glucose, Bld: 220 mg/dL — ABNORMAL HIGH (ref 70–99)
Potassium: 3.6 mmol/L (ref 3.5–5.1)
Sodium: 136 mmol/L (ref 135–145)
Total Bilirubin: 0.4 mg/dL (ref 0.3–1.2)
Total Protein: 6 g/dL — ABNORMAL LOW (ref 6.5–8.1)

## 2021-05-21 LAB — PROTIME-INR
INR: 1.1 (ref 0.8–1.2)
Prothrombin Time: 14 seconds (ref 11.4–15.2)

## 2021-05-21 LAB — HIV ANTIBODY (ROUTINE TESTING W REFLEX): HIV Screen 4th Generation wRfx: NONREACTIVE

## 2021-05-21 MED ORDER — METOPROLOL TARTRATE 25 MG PO TABS
25.0000 mg | ORAL_TABLET | Freq: Two times a day (BID) | ORAL | Status: DC
  Administered 2021-05-21 – 2021-05-22 (×2): 25 mg via ORAL
  Filled 2021-05-21 (×2): qty 1

## 2021-05-21 NOTE — Progress Notes (Signed)
PROGRESS NOTE    Anthony Hoover  ZOX:[id number] DOB: [birth year] DOA: 05/20/2021 PCP: Waldon Reining, MD    Brief Narrative:  55 year old male with a history of alcohol use, tobacco use, hypertension, admitted to the hospital with a 10-day history of cough.  Was found to be influenza A positive.  He is having significant coughing spells which was leading to episodes of vasovagal syncope.  Started on IV fluids on admission as well as supportive management of influenza.   Assessment & Plan:   Principal Problem:   Syncope Active Problems:   Hypertension   Chronic back pain   Tobacco use   Anxiety disorder   Influenza A   Alcohol abuse   Hyponatremia   Hypokalemia   SIRS (systemic inflammatory response syndrome) (HCC)   Syncope -Suspect this is vasovagal related to prolonged coughing episodes. -Cardiac enzymes are normal.,  EKG also unrevealing -Echocardiogram with EF of 70 to 75%. -Continue with cough medicine to avoid coughing triggers   Influenza A -Suspect this is precipitating event patient's symptoms -Treat supportively with Tamiflu, nebulizer treatments, mucolytic's, steroids.   SIRS -Related to influenza A -Continue IV fluids and other supportive measures -Blood cultures have not shown any growth thus far -He was started on broad-spectrum antibiotics -Since he does not have any signs of persistent bacterial infection and otherwise, does not appear septic/toxic, will discontinue further antibiotics and monitor   Hyponatremia -Likely related to hypovolemia -Improved with saline infusion   Alcohol abuse -Reports drinking approximately 8 beers per day -Reports that his last drink was on 11/17 -He is currently on CIWA protocol with Ativan   Chronic back pain -Reports that he takes oxycodone 15 mg 4 times a day, prescribed by his pain management specialist at [facility] -We will continue current treatments   Hypertension -Chronically on  lisinopril/HCTZ -Hold for now since blood pressure borderline   Anxiety -Continue home dose of Xanax  Sinus tachycardia -Continue on IV fluids since he is still somewhat dizzy on standing -We will also start on low-dose beta-blockers   DVT prophylaxis: enoxaparin (LOVENOX) injection 40 mg Start: 05/20/21 1200  Code Status: Full code Family Communication: Discussed with patient Disposition Plan: Status is: Inpatient  Remains inpatient appropriate because: Monitor overnight due to persistent tachycardia.  Possible discharge in a.m. if hemodynamics are stable.         Consultants:    Procedures:    Antimicrobials:      Subjective: Overall he is feeling little better today.  Still dizzy on standing.  Continues to have cough, but has not had any further syncope after coughing spells.  Cough is nonproductive.  Objective: Vitals:   05/21/21 1015 05/21/21 1207 05/21/21 1407 05/21/21 1639  BP: 126/83 125/83 (!) 146/98 (!) 167/94  Pulse: (!) 117 (!) 110 (!) 106 (!) 121  Resp: Temp: 97.9 F (36.6 C) 98 F (36.7 C) 98.8 F (37.1 C) 98.5 F (36.9 C)  TempSrc: Oral Oral Oral Oral  SpO2: 96% 100% 99% 99%  Weight:      Height:        Intake/Output Summary (Last 24 hours) at 05/21/2021 1710 Last data filed at 05/21/2021 8119 Gross per 24 hour  Intake 1828.7 ml  Output 2 ml  Net 1826.7 ml   Filed Weights   05/20/21 0029  Weight: 98.4 kg    Examination:  General exam: Appears calm and comfortable  Respiratory system: Clear to auscultation. Respiratory effort normal. Cardiovascular system:  S1 & S2 heard, tachycardic. No JVD, murmurs, rubs, gallops or clicks. No pedal edema. Gastrointestinal system: Abdomen is nondistended, soft and nontender. No organomegaly or masses felt. Normal bowel sounds heard. Central nervous system: Alert and oriented. No focal neurological deficits. Extremities: Symmetric 5 x 5 power. Skin: No rashes, lesions or  ulcers Psychiatry: Judgement and insight appear normal. Mood & affect appropriate.     Data Reviewed: I have personally reviewed following labs and imaging studies  CBC: Recent Labs  Lab 05/20/21 0108 05/21/21 0520  WBC 10.0 9.5  HGB 12.7* 10.4*  HCT 37.6* 31.6*  MCV 102.5* 103.6*  PLT 226 202   Basic Metabolic Panel: Recent Labs  Lab 05/20/21 0108 05/20/21 1110 05/21/21 0520  NA 128*  --  136  K 3.4*  --  3.6  CL 95*  --  106  CO2 22  --  21*  GLUCOSE 115*  --  220*  BUN 9  --  8  CREATININE 0.73  --  0.69  CALCIUM 8.8*  --  8.0*  MG  --  1.6*  --   PHOS  --  2.9  --    GFR: Estimated Creatinine Clearance: 121.3 mL/min (by C-G formula based on SCr of 0.69 mg/dL). Liver Function Tests: Recent Labs  Lab 05/21/21 0520  AST 60*  ALT 50*  ALKPHOS 91  BILITOT 0.4  PROT 6.0*  ALBUMIN 3.1*   No results for input(s): LIPASE, AMYLASE in the last 168 hours. No results for input(s): AMMONIA in the last 168 hours. Coagulation Profile: Recent Labs  Lab 05/20/21 0108 05/21/21 0520  INR 1.0 1.1   Cardiac Enzymes: No results for input(s): CKTOTAL, CKMB, CKMBINDEX, TROPONINI in the last 168 hours. BNP (last 3 results) No results for input(s): PROBNP in the last 8760 hours. HbA1C: No results for input(s): HGBA1C in the last 72 hours. CBG: Recent Labs  Lab 05/20/21 0105  GLUCAP 124*   Lipid Profile: No results for input(s): CHOL, HDL, LDLCALC, TRIG, CHOLHDL, LDLDIRECT in the last 72 hours. Thyroid Function Tests: No results for input(s): TSH, T4TOTAL, FREET4, T3FREE, THYROIDAB in the last 72 hours. Anemia Panel: No results for input(s): VITAMINB12, FOLATE, FERRITIN, TIBC, IRON, RETICCTPCT in the last 72 hours. Sepsis Labs: Recent Labs  Lab 05/20/21 0108 05/20/21 0335  LATICACIDVEN 1.4 1.2    Recent Results (from the past 240 hour(s))  Blood Culture (routine x 2)     Status: None (Preliminary result)   Collection Time: 05/20/21  1:08 AM   Specimen:  BLOOD RIGHT FOREARM  Result Value Ref Range Status   Specimen Description   Final    BLOOD RIGHT FOREARM BOTTLES DRAWN AEROBIC AND ANAEROBIC   Special Requests Blood Culture adequate volume  Final   Culture   Final    NO GROWTH 1 DAY Performed at [facility], [address], [place], [place] [address]    Report Status PENDING  Incomplete  Blood Culture (routine x 2)     Status: None (Preliminary result)   Collection Time: 05/20/21  1:08 AM   Specimen: Right Antecubital; Blood  Result Value Ref Range Status   Specimen Description   Final    RIGHT ANTECUBITAL BOTTLES DRAWN AEROBIC AND ANAEROBIC   Special Requests Blood Culture adequate volume  Final   Culture   Final    NO GROWTH 1 DAY Performed at [facility], [address] [place], [place] [address]    Report Status PENDING  Incomplete  Resp Panel  by RT-PCR (Flu A&B, Covid) Nasopharyngeal Swab     Status: Abnormal   Collection Time: 05/20/21  1:16 AM   Specimen: Nasopharyngeal Swab; Nasopharyngeal(NP) swabs in vial transport medium  Result Value Ref Range Status   SARS Coronavirus 2 by RT PCR NEGATIVE NEGATIVE Final    Comment: (NOTE) SARS-CoV-2 target nucleic acids are NOT DETECTED.  The SARS-CoV-2 RNA is generally detectable in upper respiratory specimens during the acute phase of infection. The lowest concentration of SARS-CoV-2 viral copies this assay can detect is 138 copies/mL. A negative result does not preclude SARS-Cov-2 infection and should not be used as the sole basis for treatment or other patient management decisions. A negative result may occur with  improper specimen collection/handling, submission of specimen other than nasopharyngeal swab, presence of viral mutation(s) within the areas targeted by this assay, and inadequate number of viral copies(<138 copies/mL). A negative result must be combined with clinical observations, patient history, and epidemiological information. The expected result is  Negative.  Fact Sheet for Patients:  BloggerCourse.com  Fact Sheet for Healthcare Providers:  SeriousBroker.it  This test is no t yet approved or cleared by the [place] FDA and  has been authorized for detection and/or diagnosis of SARS-CoV-2 by FDA under an Emergency Use Authorization (EUA). This EUA will remain  in effect (meaning this test can be used) for the duration of the COVID-19 declaration under Section 564(b)(1) of the Act, 21 U.S.C.section 360bbb-3(b)(1), unless the authorization is terminated  or revoked sooner.       Influenza A by PCR POSITIVE (A) NEGATIVE Final   Influenza B by PCR NEGATIVE NEGATIVE Final    Comment: (NOTE) The Xpert Xpress SARS-CoV-2/FLU/RSV plus assay is intended as an aid in the diagnosis of influenza from Nasopharyngeal swab specimens and should not be used as a sole basis for treatment. Nasal washings and aspirates are unacceptable for Xpert Xpress SARS-CoV-2/FLU/RSV testing.  Fact Sheet for Patients: BloggerCourse.com  Fact Sheet for Healthcare Providers: SeriousBroker.it  This test is not yet approved or cleared by the [place] FDA and has been authorized for detection and/or diagnosis of SARS-CoV-2 by FDA under an Emergency Use Authorization (EUA). This EUA will remain in effect (meaning this test can be used) for the duration of the COVID-19 declaration under Section 564(b)(1) of the Act, 21 U.S.C. section 360bbb-3(b)(1), unless the authorization is terminated or revoked.  Performed at [facility], [address], [place], [place] [address]          Radiology Studies: DG Ribs Unilateral W/Chest Right  Result Date: 05/20/2021 CLINICAL DATA:  Fall, chest pain EXAM: RIGHT RIBS AND CHEST - 3+ VIEW COMPARISON:  10/30/2017 FINDINGS: Multiple remote healed bilateral rib fractures are identified. No acute fracture is seen.  Lungs are clear. No pneumothorax or pleural effusion. Cardiac size within normal limits. The pulmonary vascularity is normal. The subglottic airway is markedly narrowed in the transverse dimension, possibly related to airway or hypopharyngeal inflammation. IMPRESSION: No acute fracture. Marked narrowing of the subglottic airway. Electronically Signed   By: Helyn Numbers M.D.   On: 05/20/2021 02:04   CT HEAD WO CONTRAST  Result Date: 05/20/2021 CLINICAL DATA:  Dizziness EXAM: CT HEAD WITHOUT CONTRAST TECHNIQUE: Contiguous axial images were obtained from the base of the skull through the vertex without intravenous contrast. COMPARISON:  05/12/2016 FINDINGS: Brain: No evidence of acute infarction, hemorrhage, hydrocephalus, extra-axial collection or mass lesion/mass effect. Mild atrophic changes are noted. 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 Vascular: No hyperdense vessel or unexpected calcification. Skull: Normal. Negative for fracture or focal lesion. Sinuses/Orbits: No acute finding. Other: Mild scalp swelling is noted posteriorly on the right consistent with recent injury. IMPRESSION: No acute intracranial abnormality noted. Chronic atrophic and ischemic changes are seen. Mild soft tissue swelling is noted posteriorly on the right related to the recent injury. Electronically Signed   By: Alcide Clever M.D.   On: 05/20/2021 02:23   CT Soft Tissue Neck W Contrast  Result Date: 05/20/2021 CLINICAL DATA:  54 year old male with possible subglottic stenosis on chest x-ray this morning. Dizziness. EXAM: CT NECK WITH CONTRAST TECHNIQUE: Multidetector CT imaging of the neck was performed using the standard protocol following the bolus administration of intravenous contrast. CONTRAST:  30mL OMNIPAQUE IOHEXOL 300 MG/ML  SOLN COMPARISON:  Chest radiographs 0127 hours.   Head CT 0202 hours. FINDINGS: Pharynx and larynx: Mild respiratory motion at the glottis, which appears to remain normal. No subglottic mass or stenosis. The visible trachea is within normal limits (mild respiratory motion). Mild supraglottic motion artifact also, but the epiglottis and pharyngeal soft tissue contours are normal. Negative parapharyngeal and retropharyngeal spaces. Salivary glands: Negative.  Negative sublingual space. Thyroid: Negative. Lymph nodes: No cervical lymphadenopathy. The upper limits of normal left level 2A lymph node (series 2, image 48), but other bilateral cervical nodes appear symmetric and within normal limits. No cystic or necrotic nodes. Vascular: Suboptimal intravascular contrast but the major vascular structures in the neck and at the skull base appear patent. There is bilateral carotid calcified atherosclerosis. Limited intracranial: Negative. Visualized orbits: Negative. Mastoids and visualized paranasal sinuses: Mild bilateral ethmoid sinus mucosal thickening. No visible sinus fluid levels. Visible tympanic cavities and mastoids are clear. Skeleton: Absent dentition. Chronic appearing T1 superior endplate compression deformity. No acute osseous abnormality identified. Upper chest: Negative lung apices, visible trachea, superior mediastinum. IMPRESSION: 1. Mild motion artifact but normal larynx. Negative for mass or subglottic stenosis. Suspect artifact (closed glottis) on the comparison chest x-ray this morning. 2. Negative CT appearance of the neck; bilateral carotid calcified atherosclerosis. Electronically Signed   By: Odessa Fleming M.D.   On: 05/20/2021 07:46   ECHOCARDIOGRAM COMPLETE  Result Date: 05/20/2021    ECHOCARDIOGRAM REPORT   Patient Name:   MARKEEM WENDLER Date of Exam: 05/20/2021 Medical Rec #:  [id number]       Height:       70.0 in Accession #:    [id number]      Weight:       217.0 lb Date of Birth:  March 21, 1966       BSA:          2.161 m Patient Age:    55  years        BP:           127/63 mmHg Patient Gender: M               HR:           122 bpm. Exam Location:  Inpatient Procedure: 2D Echo, Color Doppler and Cardiac Doppler Indications:    R55 Syncope  History:        Patient has no prior history of Echocardiogram examinations.                 Risk Factors:Hypertension and ETOH.  Sonographer:  Irving Burton Senior RDCS Referring Phys: [phone] [facility] Jimie Kuwahara  Sonographer Comments: Technically difficult due to patient body habitus and active upper respiratory infection. IMPRESSIONS  1. Left ventricular ejection fraction, by estimation, is 70 to 75%. The left ventricle has hyperdynamic function. The left ventricle has no regional wall motion abnormalities. Left ventricular diastolic parameters were normal.  2. Right ventricular systolic function is normal. The right ventricular size is normal.  3. The mitral valve is normal in structure. No evidence of mitral valve regurgitation. No evidence of mitral stenosis.  4. The aortic valve was not well visualized. Aortic valve regurgitation is not visualized. No aortic stenosis is present.  5. The inferior vena cava is normal in size with greater than 50% respiratory variability, suggesting right atrial pressure of 3 mmHg. Comparison(s): No prior Echocardiogram. FINDINGS  Left Ventricle: Left ventricular ejection fraction, by estimation, is 70 to 75%. The left ventricle has hyperdynamic function. The left ventricle has no regional wall motion abnormalities. The left ventricular internal cavity size was normal in size. There is no left ventricular hypertrophy. Left ventricular diastolic parameters were normal. Right Ventricle: The right ventricular size is normal. No increase in right ventricular wall thickness. Right ventricular systolic function is normal. Left Atrium: Left atrial size was normal in size. Right Atrium: Right atrial size was normal in size. Pericardium: There is no evidence of pericardial effusion. Mitral Valve: The  mitral valve is normal in structure. No evidence of mitral valve regurgitation. No evidence of mitral valve stenosis. Tricuspid Valve: The tricuspid valve is normal in structure. Tricuspid valve regurgitation is trivial. No evidence of tricuspid stenosis. Aortic Valve: The aortic valve was not well visualized. Aortic valve regurgitation is not visualized. No aortic stenosis is present. Pulmonic Valve: The pulmonic valve was not well visualized. Pulmonic valve regurgitation is not visualized. Aorta: The aortic root is normal in size and structure. Venous: The inferior vena cava is normal in size with greater than 50% respiratory variability, suggesting right atrial pressure of 3 mmHg. IAS/Shunts: No atrial level shunt detected by color flow Doppler.  LEFT VENTRICLE PLAX 2D LVIDd:         3.90 cm LVIDs:         2.80 cm LV PW:         0.90 cm LV IVS:        0.60 cm LVOT diam:     2.20 cm LV SV:         94 LV SV Index:   44 LVOT Area:     3.80 cm  RIGHT VENTRICLE RV S prime:     16.10 cm/s TAPSE (M-mode): 2.5 cm LEFT ATRIUM             Index        RIGHT ATRIUM           Index LA diam:        4.20 cm 1.94 cm/m   RA Area:     16.70 cm LA Vol (A2C):   42.6 ml 19.71 ml/m  RA Volume:   42.90 ml  19.85 ml/m LA Vol (A4C):   42.1 ml 19.48 ml/m LA Biplane Vol: 42.4 ml 19.62 ml/m  AORTIC VALVE LVOT Vmax:   164.00 cm/s LVOT Vmean:  136.000 cm/s LVOT VTI:    0.248 m  AORTA Ao Root diam: 3.30 cm  SHUNTS Systemic VTI:  0.25 m Systemic Diam: 2.20 cm Olga Millers MD Electronically signed by Olga Millers MD Signature Date/Time: 05/20/2021/2:43:34 PM  Final         Scheduled Meds:  ALPRAZolam  1 mg Oral QHS   aspirin EC  81 mg Oral Daily   budesonide (PULMICORT) nebulizer solution  0.25 mg Nebulization BID   enoxaparin (LOVENOX) injection  40 mg Subcutaneous Q24H   folic acid  1 mg Oral Daily   guaiFENesin  1,200 mg Oral BID   ipratropium-albuterol  3 mL Nebulization Q6H   LORazepam  0-4 mg Intravenous Q6H    Followed by   Melene Muller ON 05/22/2021] LORazepam  0-4 mg Intravenous Q12H   methylPREDNISolone (SOLU-MEDROL) injection  40 mg Intravenous Q12H   metoprolol tartrate  25 mg Oral BID   multivitamin with minerals  1 tablet Oral Daily   oseltamivir  75 mg Oral BID   thiamine  100 mg Oral Daily   Or   thiamine  100 mg Intravenous Daily   Continuous Infusions:  0.9 % NaCl with KCl 20 mEq / L 125 mL/hr at 05/21/21 0907     LOS: 1 day    Time spent:    Erick Blinks, MD [facility]   If 7PM-7AM, please contact night-coverage [email or web address]  05/21/2021, 5:10 PM

## 2021-05-21 NOTE — Progress Notes (Signed)
   05/21/21 1639  Assess: MEWS Score  Temp 98.5 F (36.9 C)  BP (!) 167/94  Pulse Rate (!) 121  Resp 20  SpO2 99 %  O2 Device Room Air  Assess: MEWS Score  MEWS Temp 0  MEWS Systolic 0  MEWS Pulse 2  MEWS RR 0  MEWS LOC 0  MEWS Score 2  MEWS Score Color Yellow  Assess: if the MEWS score is Yellow or Red  Were vital signs taken at a resting state? Yes  Focused Assessment No change from prior assessment  Treat  MEWS Interventions Other (Comment)  Pain Scale 0-10  Pain Score 2  Take Vital Signs  Increase Vital Sign Frequency  Yellow: Q 2hr X 2 then Q 4hr X 2, if remains yellow, continue Q 4hrs  Escalate  MEWS: Escalate Yellow: discuss with charge nurse/RN and consider discussing with provider and RRT  Notify: Charge Nurse/RN  Name of Charge Nurse/RN Notified Crystal RN  Date Charge Nurse/RN Notified 05/21/21  Time Charge Nurse/RN Notified 1651  Notify: Provider  Provider Name/Title Memon  Date Provider Notified 05/21/21  Time Provider Notified 1653  Notification Type Face-to-face  Notification Reason Other (Comment) (Yellow mews)  Provider response Other (Comment)

## 2021-05-21 NOTE — Progress Notes (Signed)
   05/21/21 1639  Assess: MEWS Score  Temp 98.5 F (36.9 C)  BP (!) 167/94  Pulse Rate (!) 121  Resp 20  SpO2 99 %  O2 Device Room Air  Assess: MEWS Score  MEWS Temp 0  MEWS Systolic 0  MEWS Pulse 2  MEWS RR 0  MEWS LOC 0  MEWS Score 2  MEWS Score Color Yellow

## 2021-05-21 NOTE — Progress Notes (Signed)
Pt stated pain in back and head rated at a 10 and requesting prn oxyCODONE (Oxy IR/ROXICODONE) immediate release tablet 15 mg    Pt lying in bed with eyes ope. Able to make needs known. Call bel in reach. Will continue to monitor.Bed in lowest position with bed alarm on.

## 2021-05-22 LAB — CREATININE, SERUM
Creatinine, Ser: 0.66 mg/dL (ref 0.61–1.24)
GFR, Estimated: >60 mL/min (ref >60)

## 2021-05-22 LAB — URINE CULTURE: Culture: NO GROWTH

## 2021-05-22 MED ORDER — LISINOPRIL 20 MG PO TABS: 20.0000 mg | ORAL_TABLET | Freq: Every day | ORAL | 11 refills | Status: AC

## 2021-05-22 MED ORDER — ALBUTEROL SULFATE (2.5 MG/3ML) 0.083% IN NEBU: 2.5000 mg | INHALATION_SOLUTION | RESPIRATORY_TRACT | 2 refills | Status: DC | PRN

## 2021-05-22 MED ORDER — ALBUTEROL SULFATE HFA 108 (90 BASE) MCG/ACT IN AERS: 1.0000 | INHALATION_SPRAY | Freq: Four times a day (QID) | RESPIRATORY_TRACT | 0 refills | Status: DC | PRN

## 2021-05-22 MED ORDER — OSELTAMIVIR PHOSPHATE 75 MG PO CAPS: 75.0000 mg | ORAL_CAPSULE | Freq: Two times a day (BID) | ORAL | 0 refills | Status: DC

## 2021-05-22 MED ORDER — NICOTINE POLACRILEX 2 MG MT GUM: 2.0000 mg | CHEWING_GUM | OROMUCOSAL | 0 refills | Status: DC | PRN

## 2021-05-22 MED ORDER — METOPROLOL TARTRATE 25 MG PO TABS: 12.5000 mg | ORAL_TABLET | Freq: Two times a day (BID) | ORAL | 1 refills | Status: DC

## 2021-05-22 MED ORDER — GUAIFENESIN ER 600 MG PO TB12: 1200.0000 mg | ORAL_TABLET | Freq: Two times a day (BID) | ORAL | 0 refills | Status: AC

## 2021-05-22 MED ORDER — PREDNISONE 10 MG PO TABS: ORAL_TABLET | ORAL | 0 refills | Status: DC

## 2021-05-22 MED ORDER — HYDROCODONE BIT-HOMATROP MBR 5-1.5 MG/5ML PO SOLN: 5.0000 mL | ORAL | 0 refills | Status: DC | PRN

## 2021-05-22 NOTE — TOC Progression Note (Signed)
Transition of Care [facility]) - Progression Note    Patient Details  Name: Anthony Hoover MRN: [id number] Date of Birth: Apr 19, 1966  Transition of Care [facility] CM/SW Contact  Leitha Bleak, RN Phone Number: 05/22/2021, 3:25 PM  Clinical Narrative:   Patient is discharging home. MD is ordering neb machine.  Morrie Sheldon with [employer] will drop ship. Add to AVS, RN will update patient.   Expected Discharge Plan: Home/Self Care Barriers to Discharge: Continued Medical Work up  Expected Discharge Plan and Services Expected Discharge Plan: Home/Self Care    Expected Discharge Date: 05/22/21                   Readmission Risk Interventions Readmission Risk Prevention Plan 05/22/2021  Transportation Screening Complete  PCP or Specialist Appt within 5-7 Days Not Complete  Home Care Screening Complete  Medication Review (RN CM) Complete  Some recent data might be hidden

## 2021-05-22 NOTE — Discharge Summary (Signed)
Physician Discharge Summary  Anthony Hoover NWG:[id number] DOB: 10-18-1965 DOA: 05/20/2021  PCP: Waldon Reining, MD  Admit date: 05/20/2021 Discharge date: 05/22/2021  Admitted From: Home Disposition: Home  Recommendations for Outpatient Follow-up:  Follow up with PCP in 1-2 weeks Please obtain BMP/CBC in one week   Home Health: Equipment/Devices: Nebulizer machine  Discharge Condition: Stable CODE STATUS: Full code Diet recommendation: Heart healthy  Brief/Interim Summary: 55 year old male with a history of alcohol use, tobacco use, hypertension, admitted to the hospital with a 10-day history of cough.  Was found to be influenza A positive.  He is having significant coughing spells which was leading to episodes of vasovagal syncope.  Started on IV fluids on admission as well as supportive management of influenza.  Discharge Diagnoses:  Principal Problem:   Syncope Active Problems:   Hypertension   Chronic back pain   Tobacco use   Anxiety disorder   Influenza A   Alcohol abuse   Hyponatremia   Hypokalemia   SIRS (systemic inflammatory response syndrome) (HCC)  Syncope -Suspect this is vasovagal related to prolonged coughing episodes. -Cardiac enzymes are normal.,  EKG also unrevealing -Echocardiogram with EF of 70 to 75%. -Continue with cough medicine to avoid coughing triggers -No further syncopal episodes during his hospital stay   Influenza A -Suspect this is precipitating event patient's symptoms -Treat supportively with Tamiflu, nebulizer treatments, mucolytic's, steroids. -Discharged on prednisone taper and bronchodilators   SIRS -Related to influenza A -Continue IV fluids and other supportive measures -Blood cultures have not shown any growth thus far -He was started on broad-spectrum antibiotics -Since he does not have any signs of persistent bacterial infection and otherwise, does not appear septic/toxic, will discontinue further antibiotics and  monitor   Hyponatremia -Likely related to hypovolemia -Improved with saline infusion   Alcohol abuse -Reports drinking approximately 8 beers per day -Reports that his last drink was on 11/17 -He is currently on CIWA protocol with Ativan -He did not develop any significant withdrawal through his hospital stay   Chronic back pain -Reports that he takes oxycodone 15 mg 4 times a day, prescribed by his pain management specialist at [facility] -We will continue current treatments   Hypertension -Chronically on lisinopril/HCTZ -Resume lisinopril on discharge, continue to hold HCTZ due to hyponatremia -Also started on metoprolol   Anxiety -Continue home dose of Xanax   Sinus tachycardia -Improved with IV fluids and beta-blockers  Discharge Instructions  Discharge Instructions     Diet - low sodium heart healthy   Complete by: As directed    For home use only DME Nebulizer machine   Complete by: As directed    Patient needs a nebulizer to treat with the following condition: Acute bronchitis   Length of Need: 6 Months   Increase activity slowly   Complete by: As directed       Allergies as of 05/22/2021   No Known Allergies      Medication List     STOP taking these medications    ALPRAZolam 1 MG tablet Commonly known as: XANAX   atorvastatin 20 MG tablet Commonly known as: LIPITOR   lisinopril-hydrochlorothiazide 20-12.5 MG tablet Commonly known as: ZESTORETIC   methylPREDNISolone 4 MG Tbpk tablet Commonly known as: MEDROL DOSEPAK   ondansetron 4 MG tablet Commonly known as: ZOFRAN       TAKE these medications    albuterol 108 (90 Base) MCG/ACT inhaler Commonly known as: VENTOLIN HFA Inhale 1-2 puffs into the lungs  every 6 (six) hours as needed for wheezing or shortness of breath. What changed: Another medication with the same name was added. Make sure you understand how and when to take each.   albuterol (2.5 MG/3ML) 0.083% nebulizer  solution Commonly known as: PROVENTIL Take 3 mLs (2.5 mg total) by nebulization every 4 (four) hours as needed for wheezing or shortness of breath. What changed: You were already taking a medication with the same name, and this prescription was added. Make sure you understand how and when to take each.   allopurinol 300 MG tablet Commonly known as: ZYLOPRIM Take 300 mg by mouth daily.   amLODipine 5 MG tablet Commonly known as: NORVASC Take 5 mg by mouth daily.   aspirin 325 MG tablet Take 650 mg by mouth daily.   cyclobenzaprine 10 MG tablet Commonly known as: FLEXERIL Take 10 mg by mouth at bedtime.   guaiFENesin 600 MG 12 hr tablet Commonly known as: MUCINEX Take 2 tablets (1,200 mg total) by mouth 2 (two) times daily.   HYDROcodone bit-homatropine 5-1.5 MG/5ML syrup Commonly known as: HYCODAN Take 5 mLs by mouth every 4 (four) hours as needed for cough.   lisinopril 20 MG tablet Commonly known as: ZESTRIL Take 1 tablet (20 mg total) by mouth daily.   metoprolol tartrate 25 MG tablet Commonly known as: LOPRESSOR Take 0.5 tablets (12.5 mg total) by mouth 2 (two) times daily.   nicotine polacrilex 2 MG gum Commonly known as: NICORETTE Take 1 each (2 mg total) by mouth as needed for smoking cessation.   oseltamivir 75 MG capsule Commonly known as: TAMIFLU Take 1 capsule (75 mg total) by mouth 2 (two) times daily.   oxyCODONE 15 MG immediate release tablet Commonly known as: ROXICODONE Take 15 mg by mouth 4 (four) times daily as needed.   predniSONE 10 MG tablet Commonly known as: DELTASONE Take  po daily for 2 days then  daily for 2 days then  daily for 2 days then  daily for 2 days then stop   Repatha Pushtronex System 420 MG/3.5ML Soct Generic drug: Evolocumab with Infusor Inject 3.5 mLs into the skin every 30 (thirty) days.   Vitamin D-3 125 MCG (5000 UT) Tabs Take 1 tablet by mouth daily.               Durable Medical Equipment   (From admission, onward)           Start     Ordered   05/22/21 0000  For home use only DME Nebulizer machine       Question Answer Comment  Patient needs a nebulizer to treat with the following condition Acute bronchitis   Length of Need 6 Months      05/22/21 1333            Follow-up Information     [facility] Follow up.   Why: Neb machine will be shipped               No Known Allergies  Consultations:    Procedures/Studies: DG Ribs Unilateral W/Chest Right  Result Date: 05/20/2021 CLINICAL DATA:  Fall, chest pain EXAM: RIGHT RIBS AND CHEST - 3+ VIEW COMPARISON:  10/30/2017 FINDINGS: Multiple remote healed bilateral rib fractures are identified. No acute fracture is seen. Lungs are clear. No pneumothorax or pleural effusion. Cardiac size within normal limits. The pulmonary vascularity is normal. The subglottic airway is markedly narrowed in the transverse dimension, possibly related to airway or hypopharyngeal inflammation.  IMPRESSION: No acute fracture. Marked narrowing of the subglottic airway. Electronically Signed   By: Helyn Numbers M.D.   On: 05/20/2021 02:04   CT HEAD WO CONTRAST  Result Date: 05/20/2021 CLINICAL DATA:  Dizziness EXAM: CT HEAD WITHOUT CONTRAST TECHNIQUE: Contiguous axial images were obtained from the base of the skull through the vertex without intravenous contrast. COMPARISON:  05/12/2016 FINDINGS: Brain: No evidence of acute infarction, hemorrhage, hydrocephalus, extra-axial collection or mass lesion/mass effect. Mild atrophic changes are noted. 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 Vascular: No hyperdense vessel or unexpected calcification. Skull: Normal. Negative for fracture or focal lesion. Sinuses/Orbits: No acute finding. Other: Mild scalp swelling is noted posteriorly on the right  consistent with recent injury. IMPRESSION: No acute intracranial abnormality noted. Chronic atrophic and ischemic changes are seen. Mild soft tissue swelling is noted posteriorly on the right related to the recent injury. Electronically Signed   By: Alcide Clever M.D.   On: 05/20/2021 02:23   CT Soft Tissue Neck W Contrast  Result Date: 05/20/2021 CLINICAL DATA:  55 year old male with possible subglottic stenosis on chest x-ray this morning. Dizziness. EXAM: CT NECK WITH CONTRAST TECHNIQUE: Multidetector CT imaging of the neck was performed using the standard protocol following the bolus administration of intravenous contrast. CONTRAST:  75mL OMNIPAQUE IOHEXOL 300 MG/ML  SOLN COMPARISON:  Chest radiographs 0127 hours.  Head CT 0202 hours. FINDINGS: Pharynx and larynx: Mild respiratory motion at the glottis, which appears to remain normal. No subglottic mass or stenosis. The visible trachea is within normal limits (mild respiratory motion). Mild supraglottic motion artifact also, but the epiglottis and pharyngeal soft tissue contours are normal. Negative parapharyngeal and retropharyngeal spaces. Salivary glands: Negative.  Negative sublingual space. Thyroid: Negative. Lymph nodes: No cervical lymphadenopathy. The upper limits of normal left level 2A lymph node (series 2, image 48), but other bilateral cervical nodes appear symmetric and within normal limits. No cystic or necrotic nodes. Vascular: Suboptimal intravascular contrast but the major vascular structures in the neck and at the skull base appear patent. There is bilateral carotid calcified atherosclerosis. Limited intracranial: Negative. Visualized orbits: Negative. Mastoids and visualized paranasal sinuses: Mild bilateral ethmoid sinus mucosal thickening. No visible sinus fluid levels. Visible tympanic cavities and mastoids are clear. Skeleton: Absent dentition. Chronic appearing T1 superior endplate compression deformity. No acute osseous abnormality  identified. Upper chest: Negative lung apices, visible trachea, superior mediastinum. IMPRESSION: 1. Mild motion artifact but normal larynx. Negative for mass or subglottic stenosis. Suspect artifact (closed glottis) on the comparison chest x-ray this morning. 2. Negative CT appearance of the neck; bilateral carotid calcified atherosclerosis. Electronically Signed   By: Odessa Fleming M.D.   On: 05/20/2021 07:46   ECHOCARDIOGRAM COMPLETE  Result Date: 05/20/2021    ECHOCARDIOGRAM REPORT   Patient Name:   Anthony Hoover Date of Exam: 05/20/2021 Medical Rec #:  [id number]       Height:       70.0 in Accession #:    [id number]      Weight:       217.0 lb Date of Birth:  [birth year]       BSA:          2.161 m Patient Age:    55 years        BP:  127/63 mmHg Patient Gender: M               HR:           122 bpm. Exam Location:  Inpatient Procedure: 2D Echo, Color Doppler and Cardiac Doppler Indications:    R55 Syncope  History:        Patient has no prior history of Echocardiogram examinations.                 Risk Factors:Hypertension and ETOH.  Sonographer:    Irving Burton Senior RDCS Referring Phys: [phone] [facility] Glendell Schlottman  Sonographer Comments: Technically difficult due to patient body habitus and active upper respiratory infection. IMPRESSIONS  1. Left ventricular ejection fraction, by estimation, is 70 to 75%. The left ventricle has hyperdynamic function. The left ventricle has no regional wall motion abnormalities. Left ventricular diastolic parameters were normal.  2. Right ventricular systolic function is normal. The right ventricular size is normal.  3. The mitral valve is normal in structure. No evidence of mitral valve regurgitation. No evidence of mitral stenosis.  4. The aortic valve was not well visualized. Aortic valve regurgitation is not visualized. No aortic stenosis is present.  5. The inferior vena cava is normal in size with greater than 50% respiratory variability, suggesting right atrial pressure of 3  mmHg. Comparison(s): No prior Echocardiogram. FINDINGS  Left Ventricle: Left ventricular ejection fraction, by estimation, is 70 to 75%. The left ventricle has hyperdynamic function. The left ventricle has no regional wall motion abnormalities. The left ventricular internal cavity size was normal in size. There is no left ventricular hypertrophy. Left ventricular diastolic parameters were normal. Right Ventricle: The right ventricular size is normal. No increase in right ventricular wall thickness. Right ventricular systolic function is normal. Left Atrium: Left atrial size was normal in size. Right Atrium: Right atrial size was normal in size. Pericardium: There is no evidence of pericardial effusion. Mitral Valve: The mitral valve is normal in structure. No evidence of mitral valve regurgitation. No evidence of mitral valve stenosis. Tricuspid Valve: The tricuspid valve is normal in structure. Tricuspid valve regurgitation is trivial. No evidence of tricuspid stenosis. Aortic Valve: The aortic valve was not well visualized. Aortic valve regurgitation is not visualized. No aortic stenosis is present. Pulmonic Valve: The pulmonic valve was not well visualized. Pulmonic valve regurgitation is not visualized. Aorta: The aortic root is normal in size and structure. Venous: The inferior vena cava is normal in size with greater than 50% respiratory variability, suggesting right atrial pressure of 3 mmHg. IAS/Shunts: No atrial level shunt detected by color flow Doppler.  LEFT VENTRICLE PLAX 2D LVIDd:         3.90 cm LVIDs:         2.80 cm LV PW:         0.90 cm LV IVS:        0.60 cm LVOT diam:     2.20 cm LV SV:         94 LV SV Index:   44 LVOT Area:     3.80 cm  RIGHT VENTRICLE RV S prime:     16.10 cm/s TAPSE (M-mode): 2.5 cm LEFT ATRIUM             Index        RIGHT ATRIUM           Index LA diam:        4.20 cm 1.94 cm/m   RA Area:  16.70 cm LA Vol (A2C):   42.6 ml 19.71 ml/m  RA Volume:   42.90 ml  19.85  ml/m LA Vol (A4C):   42.1 ml 19.48 ml/m LA Biplane Vol: 42.4 ml 19.62 ml/m  AORTIC VALVE LVOT Vmax:   164.00 cm/s LVOT Vmean:  136.000 cm/s LVOT VTI:    0.248 m  AORTA Ao Root diam: 3.30 cm  SHUNTS Systemic VTI:  0.25 m Systemic Diam: 2.20 cm Olga Millers MD Electronically signed by Olga Millers MD Signature Date/Time: 05/20/2021/2:43:34 PM    Final       Subjective: Overall cough is improving.  Still has some shortness of breath and some wheezing.  Able to ambulate on room air.  Discharge Exam: Vitals:   05/22/21 0756 05/22/21 0953 05/22/21 1220 05/22/21 1418  BP: (!) 123/96 (!) 129/98 (!) 150/102   Pulse: 96 (!) 114 90   Resp: 18  18   Temp: 97.6 F (36.4 C) 98 F (36.7 C) 98.4 F (36.9 C)   TempSrc: Oral Oral    SpO2: 97% 96% 97% 97%  Weight:      Height:        General: Pt is alert, awake, not in acute distress Cardiovascular: RRR, S1/S2 +, no rubs, no gallops Respiratory: Mild wheeze bilaterally Abdominal: Soft, NT, ND, bowel sounds + Extremities: no edema, no cyanosis    The results of significant diagnostics from this hospitalization (including imaging, microbiology, ancillary and laboratory) are listed below for reference.     Microbiology: Recent Results (from the past 240 hour(s))  Blood Culture (routine x 2)     Status: None (Preliminary result)   Collection Time: 05/20/21  1:08 AM   Specimen: BLOOD RIGHT FOREARM  Result Value Ref Range Status   Specimen Description   Final    BLOOD RIGHT FOREARM BOTTLES DRAWN AEROBIC AND ANAEROBIC   Special Requests Blood Culture adequate volume  Final   Culture   Final    NO GROWTH 2 DAYS Performed at [facility], [address] [place], [place] [address]    Report Status PENDING  Incomplete  Blood Culture (routine x 2)     Status: None (Preliminary result)   Collection Time: 05/20/21  1:08 AM   Specimen: Right Antecubital; Blood  Result Value Ref Range Status   Specimen Description   Final    RIGHT  ANTECUBITAL BOTTLES DRAWN AEROBIC AND ANAEROBIC   Special Requests Blood Culture adequate volume  Final   Culture   Final    NO GROWTH 2 DAYS Performed at [facility], [address] [place], [place] [address]    Report Status PENDING  Incomplete  Resp Panel by RT-PCR (Flu A&B, Covid) Nasopharyngeal Swab     Status: Abnormal   Collection Time: 05/20/21  1:16 AM   Specimen: Nasopharyngeal Swab; Nasopharyngeal(NP) swabs in vial transport medium  Result Value Ref Range Status   SARS Coronavirus 2 by RT PCR NEGATIVE NEGATIVE Final    Comment: (NOTE) SARS-CoV-2 target nucleic acids are NOT DETECTED.  The SARS-CoV-2 RNA is generally detectable in upper respiratory specimens during the acute phase of infection. The lowest concentration of SARS-CoV-2 viral copies this assay can detect is 138 copies/mL. A negative result does not preclude SARS-Cov-2 infection and should not be used as the sole basis for treatment or other patient management decisions. A negative result may occur with  improper specimen collection/handling, submission of specimen other than nasopharyngeal swab, presence of viral mutation(s) within the areas targeted by  this assay, and inadequate number of viral copies(<138 copies/mL). A negative result must be combined with clinical observations, patient history, and epidemiological information. The expected result is Negative.  Fact Sheet for Patients:  BloggerCourse.com  Fact Sheet for Healthcare Providers:  SeriousBroker.it  This test is no t yet approved or cleared by the [place] FDA and  has been authorized for detection and/or diagnosis of SARS-CoV-2 by FDA under an Emergency Use Authorization (EUA). This EUA will remain  in effect (meaning this test can be used) for the duration of the COVID-19 declaration under Section 564(b)(1) of the Act, 21 U.S.C.section 360bbb-3(b)(1), unless the authorization is  terminated  or revoked sooner.       Influenza A by PCR POSITIVE (A) NEGATIVE Final   Influenza B by PCR NEGATIVE NEGATIVE Final    Comment: (NOTE) The Xpert Xpress SARS-CoV-2/FLU/RSV plus assay is intended as an aid in the diagnosis of influenza from Nasopharyngeal swab specimens and should not be used as a sole basis for treatment. Nasal washings and aspirates are unacceptable for Xpert Xpress SARS-CoV-2/FLU/RSV testing.  Fact Sheet for Patients: BloggerCourse.com  Fact Sheet for Healthcare Providers: SeriousBroker.it  This test is not yet approved or cleared by the [place] FDA and has been authorized for detection and/or diagnosis of SARS-CoV-2 by FDA under an Emergency Use Authorization (EUA). This EUA will remain in effect (meaning this test can be used) for the duration of the COVID-19 declaration under Section 564(b)(1) of the Act, 21 U.S.C. section 360bbb-3(b)(1), unless the authorization is terminated or revoked.  Performed at [facility], [address], [place], [place] [address]   Urine Culture     Status: None   Collection Time: 05/20/21 11:50 AM   Specimen: In/Out Cath Urine  Result Value Ref Range Status   Specimen Description   Final    IN/OUT CATH URINE Performed at [facility], [address] [place], [place] [address]    Special Requests   Final    NONE Performed at [facility], [address], [place], [place] [address]    Culture   Final    NO GROWTH Performed at [facility], [address] [place], [place] [address]    Report Status 05/22/2021 FINAL  Final     Labs: BNP (last 3 results) No results for input(s): BNP in the last 8760 hours. Basic Metabolic Panel: Recent Labs  Lab 05/20/21 0108 05/20/21 1110 05/21/21 0520 05/22/21 0432  NA 128*  --  136  --   K 3.4*  --  3.6  --   CL 95*  --  106  --   CO2 22  --  21*  --   GLUCOSE 115*  --  220*  --   BUN 9  --  8  --    CREATININE 0.73  --  0.69 0.66  CALCIUM 8.8*  --  8.0*  --   MG  --  1.6*  --   --   PHOS  --  2.9  --   --    Liver Function Tests: Recent Labs  Lab 05/21/21 0520  AST 60*  ALT 50*  ALKPHOS 91  BILITOT 0.4  PROT 6.0*  ALBUMIN 3.1*   No results for input(s): LIPASE, AMYLASE in the last 168 hours. No results for input(s): AMMONIA in the last 168 hours. CBC: Recent Labs  Lab 05/20/21 0108 05/21/21 0520  WBC 10.0 9.5  HGB 12.7* 10.4*  HCT 37.6* 31.6*  MCV 102.5* 103.6*  PLT 226 202   Cardiac Enzymes: No results for input(s): CKTOTAL, CKMB, CKMBINDEX, TROPONINI in the last 168 hours. BNP: Invalid input(s): POCBNP CBG: Recent Labs  Lab 05/20/21 0105  GLUCAP 124*   D-Dimer Recent Labs    05/20/21 0108  DDIMER 0.41   Hgb A1c No results for input(s): HGBA1C in the last 72 hours. Lipid Profile No results for input(s): CHOL, HDL, LDLCALC, TRIG, CHOLHDL, LDLDIRECT in the last 72 hours. Thyroid function studies No results for input(s): TSH, T4TOTAL, T3FREE, THYROIDAB in the last 72 hours.  Invalid input(s): FREET3 Anemia work up No results for input(s): VITAMINB12, FOLATE, FERRITIN, TIBC, IRON, RETICCTPCT in the last 72 hours. Urinalysis    Component Value Date/Time   COLORURINE STRAW (A) 05/20/2021 1150   APPEARANCEUR CLEAR 05/20/2021 1150   LABSPEC 1.013 05/20/2021 1150   PHURINE 6.0 05/20/2021 1150   GLUCOSEU 50 (A) 05/20/2021 1150   HGBUR SMALL (A) 05/20/2021 1150   BILIRUBINUR NEGATIVE 05/20/2021 1150   KETONESUR NEGATIVE 05/20/2021 1150   PROTEINUR NEGATIVE 05/20/2021 1150   UROBILINOGEN 0.2 09/24/2011 2146   NITRITE NEGATIVE 05/20/2021 1150   LEUKOCYTESUR NEGATIVE 05/20/2021 1150   Sepsis Labs Invalid input(s): PROCALCITONIN,  WBC,  LACTICIDVEN Microbiology Recent Results (from the past 240 hour(s))  Blood Culture (routine x 2)     Status: None (Preliminary result)   Collection Time: 05/20/21  1:08 AM   Specimen: BLOOD RIGHT FOREARM  Result  Value Ref Range Status   Specimen Description   Final    BLOOD RIGHT FOREARM BOTTLES DRAWN AEROBIC AND ANAEROBIC   Special Requests Blood Culture adequate volume  Final   Culture   Final    NO GROWTH 2 DAYS Performed at [facility], [address], [place], [place] [address]    Report Status PENDING  Incomplete  Blood Culture (routine x 2)     Status: None (Preliminary result)   Collection Time: 05/20/21  1:08 AM   Specimen: Right Antecubital; Blood  Result Value Ref Range Status   Specimen Description   Final    RIGHT ANTECUBITAL BOTTLES DRAWN AEROBIC AND ANAEROBIC   Special Requests Blood Culture adequate volume  Final   Culture   Final    NO GROWTH 2 DAYS Performed at [facility], [address], [place], [place] [address]    Report Status PENDING  Incomplete  Resp Panel by RT-PCR (Flu A&B, Covid) Nasopharyngeal Swab     Status: Abnormal   Collection Time: 05/20/21  1:16 AM   Specimen: Nasopharyngeal Swab; Nasopharyngeal(NP) swabs in vial transport medium  Result Value Ref Range Status   SARS Coronavirus 2 by RT PCR NEGATIVE NEGATIVE Final    Comment: (NOTE) SARS-CoV-2 target nucleic acids are NOT DETECTED.  The SARS-CoV-2 RNA is generally detectable in upper respiratory specimens during the acute phase of infection. The lowest concentration of SARS-CoV-2 viral copies this assay can detect is 138 copies/mL. A negative result does not preclude SARS-Cov-2 infection and should not be used as the sole basis for treatment or other patient management decisions. A negative result may occur with  improper specimen collection/handling, submission of specimen other than nasopharyngeal swab, presence of viral mutation(s) within the areas targeted by this assay, and inadequate number of viral copies(<138 copies/mL). A negative result must be combined with clinical observations, patient history, and epidemiological information. The expected result is Negative.  Fact Sheet for  Patients:  BloggerCourse.com  Fact Sheet for Healthcare Providers:  SeriousBroker.it  This  test is no t yet approved or cleared by the [place] and  has been authorized for detection and/or diagnosis of SARS-CoV-2 by FDA under an Emergency Use Authorization (EUA). This EUA will remain  in effect (meaning this test can be used) for the duration of the COVID-19 declaration under Section 564(b)(1) of the Act, 21 U.S.C.section 360bbb-3(b)(1), unless the authorization is terminated  or revoked sooner.       Influenza A by PCR POSITIVE (A) NEGATIVE Final   Influenza B by PCR NEGATIVE NEGATIVE Final    Comment: (NOTE) The Xpert Xpress SARS-CoV-2/FLU/RSV plus assay is intended as an aid in the diagnosis of influenza from Nasopharyngeal swab specimens and should not be used as a sole basis for treatment. Nasal washings and aspirates are unacceptable for Xpert Xpress SARS-CoV-2/FLU/RSV testing.  Fact Sheet for Patients: BloggerCourse.com  Fact Sheet for Healthcare Providers: [email or web address]  This test is not yet approved or cleared by the [place] FDA and has been authorized for detection and/or diagnosis of SARS-CoV-2 by FDA under an Emergency Use Authorization (EUA). This EUA will remain in effect (meaning this test can be used) for the duration of the COVID-19 declaration under Section 564(b)(1) of the Act, 21 U.S.C. section 360bbb-3(b)(1), unless the authorization is terminated or revoked.  Performed at [facility], [address] [place], [place] [address]   Urine Culture     Status: None   Collection Time: 05/20/21 11:50 AM   Specimen: In/Out Cath Urine  Result Value Ref Range Status   Specimen Description   Final    IN/OUT CATH URINE Performed at [facility], [address] [place], [place] [address]    Special Requests   Final    NONE Performed at [facility] [address] [place], [place] [address]    Culture   Final    NO GROWTH Performed at [facility], [address], [place], [place] [address]    Report Status 05/22/2021 FINAL  Final     Time coordinating discharge:  SIGNED:   Erick Blinks, MD  [facility] 05/22/2021, 8:44 PM   If 7PM-7AM, please contact night-coverage [email or web address]

## 2021-05-22 NOTE — Plan of Care (Signed)

## 2021-05-22 NOTE — Plan of Care (Signed)
  Problem: Education: Goal: Knowledge of General Education information will improve Description: Including pain rating scale, medication(s)/side effects and non-pharmacologic comfort measures 05/22/2021 1455 by Sheela Stack, RN Outcome: Adequate for Discharge 05/22/2021 1050 by Sheela Stack, RN Outcome: Progressing   Problem: Health Behavior/Discharge Planning: Goal: Ability to manage health-related needs will improve 05/22/2021 1455 by Sheela Stack, RN Outcome: Adequate for Discharge 05/22/2021 1050 by Sheela Stack, RN Outcome: Progressing   Problem: Clinical Measurements: Goal: Ability to maintain clinical measurements within normal limits will improve 05/22/2021 1455 by Sheela Stack, RN Outcome: Adequate for Discharge 05/22/2021 1050 by Sheela Stack, RN Outcome: Progressing Goal: Will remain free from infection 05/22/2021 1455 by Sheela Stack, RN Outcome: Adequate for Discharge 05/22/2021 1050 by Sheela Stack, RN Outcome: Progressing Goal: Diagnostic test results will improve 05/22/2021 1455 by Sheela Stack, RN Outcome: Adequate for Discharge 05/22/2021 1050 by Sheela Stack, RN Outcome: Progressing Goal: Respiratory complications will improve 05/22/2021 1455 by Sheela Stack, RN Outcome: Adequate for Discharge 05/22/2021 1050 by Sheela Stack, RN Outcome: Progressing Goal: Cardiovascular complication will be avoided 05/22/2021 1455 by Sheela Stack, RN Outcome: Adequate for Discharge 05/22/2021 1050 by Sheela Stack, RN Outcome: Progressing   Problem: Activity: Goal: Risk for activity intolerance will decrease 05/22/2021 1455 by Sheela Stack, RN Outcome: Adequate for Discharge 05/22/2021 1050 by Sheela Stack, RN Outcome: Progressing   Problem: Nutrition: Goal: Adequate nutrition will be maintained 05/22/2021 1455 by Sheela Stack, RN Outcome: Adequate for Discharge 05/22/2021 1050 by Sheela Stack, RN Outcome:  Progressing   Problem: Coping: Goal: Level of anxiety will decrease 05/22/2021 1455 by Sheela Stack, RN Outcome: Adequate for Discharge 05/22/2021 1050 by Sheela Stack, RN Outcome: Progressing   Problem: Elimination: Goal: Will not experience complications related to bowel motility 05/22/2021 1455 by Sheela Stack, RN Outcome: Adequate for Discharge 05/22/2021 1050 by Sheela Stack, RN Outcome: Progressing Goal: Will not experience complications related to urinary retention 05/22/2021 1455 by Sheela Stack, RN Outcome: Adequate for Discharge 05/22/2021 1050 by Sheela Stack, RN Outcome: Progressing   Problem: Pain Managment: Goal: General experience of comfort will improve 05/22/2021 1455 by Sheela Stack, RN Outcome: Adequate for Discharge 05/22/2021 1050 by Sheela Stack, RN Outcome: Progressing   Problem: Safety: Goal: Ability to remain free from injury will improve 05/22/2021 1455 by Sheela Stack, RN Outcome: Adequate for Discharge 05/22/2021 1050 by Sheela Stack, RN Outcome: Progressing   Problem: Skin Integrity: Goal: Risk for impaired skin integrity will decrease 05/22/2021 1455 by Sheela Stack, RN Outcome: Adequate for Discharge 05/22/2021 1050 by Sheela Stack, RN Outcome: Progressing

## 2021-05-25 LAB — CULTURE, BLOOD (ROUTINE X 2)
Culture: NO GROWTH
Culture: NO GROWTH
Special Requests: ADEQUATE
Special Requests: ADEQUATE

## 2021-08-02 ENCOUNTER — Ambulatory Visit: Payer: Medicare Other | Admitting: Gastroenterology

## 2021-08-02 ENCOUNTER — Encounter: Payer: Self-pay | Admitting: Internal Medicine

## 2022-02-20 ENCOUNTER — Other Ambulatory Visit: Payer: Self-pay

## 2022-02-20 ENCOUNTER — Emergency Department (HOSPITAL_COMMUNITY)
Admission: EM | Admit: 2022-02-20 | Discharge: 2022-02-20 | Disposition: A | Discharge status: Home / Self Care | Payer: Medicare Other | Attending: Emergency Medicine | Admitting: Emergency Medicine

## 2022-02-20 ENCOUNTER — Encounter (HOSPITAL_COMMUNITY): Payer: Self-pay

## 2022-02-20 DIAGNOSIS — R002 Palpitations: Secondary | ICD-10-CM | POA: Diagnosis present

## 2022-02-20 DIAGNOSIS — I471 Supraventricular tachycardia: Secondary | ICD-10-CM | POA: Insufficient documentation

## 2022-02-20 DIAGNOSIS — Z79899 Other long term (current) drug therapy: Secondary | ICD-10-CM | POA: Diagnosis not present

## 2022-02-20 DIAGNOSIS — Z7982 Long term (current) use of aspirin: Secondary | ICD-10-CM | POA: Insufficient documentation

## 2022-02-20 DIAGNOSIS — I10 Essential (primary) hypertension: Secondary | ICD-10-CM | POA: Insufficient documentation

## 2022-02-20 DIAGNOSIS — F17210 Nicotine dependence, cigarettes, uncomplicated: Secondary | ICD-10-CM | POA: Insufficient documentation

## 2022-02-20 LAB — TSH: TSH: 2.391 u[IU]/mL (ref 0.350–4.500)

## 2022-02-20 LAB — CBC WITH DIFFERENTIAL/PLATELET
Abs Immature Granulocytes: 0.03 10*3/uL (ref 0.00–0.07)
Basophils Absolute: 0.1 10*3/uL (ref 0.0–0.1)
Basophils Relative: 1 %
Eosinophils Absolute: 0.2 10*3/uL (ref 0.0–0.5)
Eosinophils Relative: 3 %
HCT: 44.7 % (ref 39.0–52.0)
Hemoglobin: 15.1 g/dL (ref 13.0–17.0)
Immature Granulocytes: 0 %
Lymphocytes Relative: 17 %
Lymphs Abs: 1.4 10*3/uL (ref 0.7–4.0)
MCH: 34.1 pg — ABNORMAL HIGH (ref 26.0–34.0)
MCHC: 33.8 g/dL (ref 30.0–36.0)
MCV: 100.9 fL — ABNORMAL HIGH (ref 80.0–100.0)
Monocytes Absolute: 0.9 10*3/uL (ref 0.1–1.0)
Monocytes Relative: 10 %
Neutro Abs: 6 10*3/uL (ref 1.7–7.7)
Neutrophils Relative %: 69 %
Platelets: 293 10*3/uL (ref 150–400)
RBC: 4.43 MIL/uL (ref 4.22–5.81)
RDW: 12 % (ref 11.5–15.5)
WBC: 8.6 10*3/uL (ref 4.0–10.5)
nRBC: 0 % (ref 0.0–0.2)

## 2022-02-20 LAB — BASIC METABOLIC PANEL
Anion gap: 11 (ref 5–15)
BUN: 11 mg/dL (ref 6–20)
CO2: 25 mmol/L (ref 22–32)
Calcium: 9.8 mg/dL (ref 8.9–10.3)
Chloride: 102 mmol/L (ref 98–111)
Creatinine, Ser: 0.91 mg/dL (ref 0.61–1.24)
GFR, Estimated: >60 mL/min (ref >60)
Glucose, Bld: 112 mg/dL — ABNORMAL HIGH (ref 70–99)
Potassium: 3.7 mmol/L (ref 3.5–5.1)
Sodium: 138 mmol/L (ref 135–145)

## 2022-02-20 LAB — PROTIME-INR
INR: 1 (ref 0.8–1.2)
Prothrombin Time: 12.6 seconds (ref 11.4–15.2)

## 2022-02-20 LAB — D-DIMER, QUANTITATIVE: D-Dimer, Quant: 0.3 ug/mL-FEU (ref 0.00–0.50)

## 2022-02-20 MED ORDER — SODIUM CHLORIDE 0.9 % IV BOLUS
1000.0000 mL | Freq: Once | INTRAVENOUS | Status: AC
  Administered 2022-02-20: 1000 mL via INTRAVENOUS

## 2022-02-20 MED ORDER — ADENOSINE 6 MG/2ML IV SOLN
INTRAVENOUS | Status: AC
  Administered 2022-02-20: 6 mg
  Filled 2022-02-20: qty 2

## 2022-02-20 MED ORDER — ADENOSINE 6 MG/2ML IV SOLN
INTRAVENOUS | Status: AC
  Administered 2022-02-20: 12 mg
  Filled 2022-02-20: qty 4

## 2022-02-20 NOTE — Discharge Instructions (Addendum)
We evaluated you in the emergency department for your elevated heart rate.  Your symptoms were due to a condition called supraventricular tachycardia.  Your lab tests were reassuring.  Your test did not show that your elevated heart rate was due to any dangerous condition.  Please monitor your symptoms closely at home.  If you develop any chest pain, difficulty breathing, recurrent elevated heart rate, or any other concerning symptoms, please return to the emergency department for recheck.  Please follow-up with your primary doctor.  Please also follow-up with a cardiologist.  We have included the contact information for a cardiologist in the chart.

## 2022-02-20 NOTE — ED Notes (Addendum)
6 mg Adenosine pushed

## 2022-02-20 NOTE — ED Triage Notes (Signed)
Pt complaining of his heart beating fast started about 30 min ago.

## 2022-02-20 NOTE — ED Notes (Signed)
12mg  adenosine pushed

## 2022-02-21 NOTE — ED Provider Notes (Signed)
[facility] EMERGENCY DEPARTMENT Provider Note  CSN: [id number] Arrival date & time: 02/20/22 2001  Chief Complaint(s) Tachycardia  HPI Anthony Hoover is a 56 y.o. male with history of alcohol use presenting to the emergency department with palpitations.  Patient reports that he developed palpitations today, just prior to arrival.  He reports feeling a funny feeling in his chest but no chest pain.  He denies any loss of consciousness, shortness of breath, fevers, chills, diaphoresis.  He reports his symptoms are constant.  Reports similar episodes in the past but never came to the hospital.  Symptoms are mild.   Past Medical History Past Medical History:  Diagnosis Date   Alcohol abuse    Anxiety disorder    Back pain    pain management by Dr. Janna Arch   Compression fracture    T-spine   CVA (cerebral infarction)    2009   Hypertension    Insomnia    Tobacco abuse    Patient Active Problem List   Diagnosis Date Noted   Syncope 05/20/2021   Influenza A 05/20/2021   Alcohol abuse 05/20/2021   Hyponatremia 05/20/2021   Hypokalemia 05/20/2021   SIRS (systemic inflammatory response syndrome) (HCC) 05/20/2021   Diastasis recti 07/23/2017   Seizure due to alcohol withdrawal (HCC) 05/12/2016   Seizure (HCC) 05/12/2016   Back pain    CVA (cerebral infarction)    Anxiety disorder    Fall from tree 09/25/2011   Multiple fractures of ribs of left side 09/25/2011   Right pulmonary contusion 09/25/2011   Hypertension 09/25/2011   Chronic back pain 09/25/2011   Wedge compression fracture of T8 vertebra (HCC) 09/25/2011   Wedge compression fracture of T10 vertebra (HCC) 09/25/2011   Sternal fracture 09/25/2011   Fracture of transverse process of thoracic vertebra (HCC) 09/25/2011   Heavy alcohol use 09/25/2011   Acute blood loss anemia 09/25/2011   History of CVA (cerebrovascular accident) 09/25/2011   Tobacco use 09/25/2011   Home Medication(s) Prior to Admission medications    Medication Sig Start Date End Date Taking? Authorizing Provider  albuterol (PROVENTIL) (2.5 MG/3ML) 0.083% nebulizer solution Take 3 mLs (2.5 mg total) by nebulization every 4 (four) hours as needed for wheezing or shortness of breath. 05/22/21 05/22/22  Erick Blinks, MD  albuterol (VENTOLIN HFA) 108 (90 Base) MCG/ACT inhaler Inhale 1-2 puffs into the lungs every 6 (six) hours as needed for wheezing or shortness of breath. 05/22/21   Erick Blinks, MD  allopurinol (ZYLOPRIM) 300 MG tablet Take 300 mg by mouth daily. 04/11/21   [provider]  amLODipine (NORVASC) 5 MG tablet Take 5 mg by mouth daily. 02/09/21   [provider]  aspirin 325 MG tablet Take 650 mg by mouth daily.    [provider]  Cholecalciferol (VITAMIN D-3) 125 MCG (5000 UT) TABS Take 1 tablet by mouth daily.    [provider]  cyclobenzaprine (FLEXERIL) 10 MG tablet Take 10 mg by mouth at bedtime. 03/13/21   [provider]  guaiFENesin (MUCINEX) 600 MG 12 hr tablet Take 2 tablets (1,200 mg total) by mouth 2 (two) times daily. 05/22/21   Erick Blinks, MD  HYDROcodone bit-homatropine (HYCODAN) 5-1.5 MG/5ML syrup Take 5 mLs by mouth every 4 (four) hours as needed for cough. 05/22/21   Erick Blinks, MD  lisinopril (ZESTRIL) 20 MG tablet Take 1 tablet (20 mg total) by mouth daily. 05/22/21 05/22/22  Erick Blinks, MD  metoprolol tartrate (LOPRESSOR) 25 MG tablet Take 0.5  tablets (12.5 mg total) by mouth 2 (two) times daily. 05/22/21   Kathie Dike, MD  nicotine polacrilex (NICORETTE) 2 MG gum Take 1 each (2 mg total) by mouth as needed for smoking cessation. 05/22/21   Kathie Dike, MD  oseltamivir (TAMIFLU) 75 MG capsule Take 1 capsule (75 mg total) by mouth 2 (two) times daily. 05/22/21   Kathie Dike, MD  oxyCODONE (ROXICODONE) 15 MG immediate release tablet Take 15 mg by mouth 4 (four) times daily as needed. 03/22/21   [provider]  predniSONE  (DELTASONE) 10 MG tablet Take 40mg  po daily for 2 days then 30mg  daily for 2 days then 20mg  daily for 2 days then 10mg  daily for 2 days then stop 05/22/21   Kathie Dike, MD  Iraan 420 MG/3.5ML SOCT Inject 3.5 mLs into the skin every 30 (thirty) days. 04/11/21   [provider]                                                                                                                                    Past Surgical History Past Surgical History:  Procedure Laterality Date   UMBILICAL HERNIA REPAIR     Family History Family History  Problem Relation Age of Onset   Diabetes Mother    Cancer Father    Heart disease Father     Social History Social History   Tobacco Use   Smoking status: Every Day    Packs/day: 0.00    Types: Cigarettes   Smokeless tobacco: Never  Vaping Use   Vaping Use: Never used  Substance Use Topics   Alcohol use: Yes    Alcohol/week: 8.0 standard drinks of alcohol    Types: 8 Cans of beer per week    Comment: daily   Drug use: No   Allergies Patient has no known allergies.  Review of Systems Review of Systems  All other systems reviewed and are negative.   Physical Exam Vital Signs  I have reviewed the triage vital signs BP (!) 142/98   Pulse (!) 108   Temp 98.6 F (37 C)   Resp 20   Ht 5\' 9"  (1.753 m)   Wt 97.5 kg   SpO2 100%   BMI 31.75 kg/m  Physical Exam Vitals and nursing note reviewed.  Constitutional:      General: He is not in acute distress.    Appearance: Normal appearance.  HENT:     Mouth/Throat:     Mouth: Mucous membranes are moist.  Cardiovascular:     Rate and Rhythm: Regular rhythm. Tachycardia present.     Pulses: Normal pulses.     Heart sounds: No murmur heard. Pulmonary:     Effort: Pulmonary effort is normal. No respiratory distress.     Breath sounds: Normal breath sounds.  Abdominal:     General: Abdomen is flat.     Palpations: Abdomen is soft.  Tenderness: There  is no abdominal tenderness.  Skin:    General: Skin is warm and dry.     Capillary Refill: Capillary refill takes less than 2 seconds.  Neurological:     Mental Status: He is alert and oriented to person, place, and time. Mental status is at baseline.  Psychiatric:        Mood and Affect: Mood normal.        Behavior: Behavior normal.     ED Results and Treatments Labs (all labs ordered are listed, but only abnormal results are displayed) Labs Reviewed  BASIC METABOLIC PANEL - Abnormal; Notable for the following components:      Result Value   Glucose, Bld 112 (*)    All other components within normal limits  CBC WITH DIFFERENTIAL/PLATELET - Abnormal; Notable for the following components:   MCV 100.9 (*)    MCH 34.1 (*)    All other components within normal limits  PROTIME-INR  TSH  D-DIMER, QUANTITATIVE                                                                                                                          Radiology No results found.  Pertinent labs & imaging results that were available during my care of the patient were reviewed by me and considered in my medical decision making (see MDM for details).  Medications Ordered in ED Medications  adenosine (ADENOCARD) 6 MG/2ML injection (6 mg  Given 02/20/22 2025)  adenosine (ADENOCARD) 6 MG/2ML injection (12 mg  Given 02/20/22 2028)  sodium chloride 0.9 % bolus 1,000 mL (0 mLs Intravenous Stopped 02/20/22 2159)  sodium chloride 0.9 % bolus 1,000 mL (0 mLs Intravenous Stopped 02/20/22 2314)                                                                                                                                     Procedures .1-3 Lead EKG Interpretation  Performed by: Cristie Hem, MD Authorized by: Cristie Hem, MD     Interpretation: abnormal     ECG rate assessment: tachycardic     Rhythm: SVT     Ectopy: none     Conduction: normal     (including critical care time)  Medical  Decision Making / ED Course   MDM:  56 year old male presenting to the emergency department with tachycardia.  Vitals notable for significant  tachycardia.  Patient otherwise with normal blood pressure.  Mentating normally.  Denies chest pain.  EKG notable for supraventricular tachycardia.  Administered adenosine x2 with cardioversion to tachycardia with sinus rhythm.  Patient had some persistent tachycardia so administered IV fluids.  Tachycardia improved.  D-dimer negative, TSH negative, doubt PE or hyperthyroidism as triggers for patient's symptoms.  Patient reports that he felt asymptomatic.  Placed referral for outpatient follow-up with cardiologist.  Advise close follow-up with primary physician as well.  Patient request discharge so will discharge from the hospital.  Advised strict return precautions should he have any resolution of symptoms. Will discharge patient to home. All questions answered. Patient comfortable with plan of discharge. Return precautions discussed with patient and specified on the after visit summary.       Additional history obtained: -Additional history obtained from daughter -External records from outside source obtained and reviewed including: Chart review including previous notes, labs, imaging, consultation notes   Lab Tests: -I ordered, reviewed, and interpreted labs.   The pertinent results include:   Labs Reviewed  BASIC METABOLIC PANEL - Abnormal; Notable for the following components:      Result Value   Glucose, Bld 112 (*)    All other components within normal limits  CBC WITH DIFFERENTIAL/PLATELET - Abnormal; Notable for the following components:   MCV 100.9 (*)    MCH 34.1 (*)    All other components within normal limits  PROTIME-INR  TSH  D-DIMER, QUANTITATIVE      EKG   EKG Interpretation  Date/Time:  Tuesday February 20 2022 20:29:27 EDT Ventricular Rate:  141 PR Interval:  122 QRS Duration: 91 QT Interval:  282 QTC  Calculation: 432 R Axis:   30 Text Interpretation: Sinus tachycardia Atrial premature complex Probable left atrial enlargement RSR' in V1 or V2, right VCD or RVH Baseline wander in lead(s) II III aVR aVF V1 V4 V5 V6 Confirmed by Garnette Gunner [phone]) on 02/20/2022 11:04:34 PM           Medicines ordered and prescription drug management: Meds ordered this encounter  Medications   adenosine (ADENOCARD) 6 MG/2ML injection    Ragland, Melissa B: cabinet override   adenosine (ADENOCARD) 6 MG/2ML injection    Leverne Humbles: cabinet override   sodium chloride 0.9 % bolus 1,000 mL   sodium chloride 0.9 % bolus 1,000 mL    -I have reviewed the patients home medicines and have made adjustments as needed     Cardiac Monitoring: The patient was maintained on a cardiac monitor.  I personally viewed and interpreted the cardiac monitored which showed an underlying rhythm of: SVT  Social Determinants of Health:  Factors impacting patients care include: alcohol use   Reevaluation: After the interventions noted above, I reevaluated the patient and found that they have :resolved  Co morbidities that complicate the patient evaluation  Past Medical History:  Diagnosis Date   Alcohol abuse    Anxiety disorder    Back pain    pain management by Dr. Cindie Laroche   Compression fracture    T-spine   CVA (cerebral infarction)    2009   Hypertension    Insomnia    Tobacco abuse       Dispostion: Discharge     Final Clinical Impression(s) / ED Diagnoses Final diagnoses:  SVT (supraventricular tachycardia) ([place])     This chart was dictated using voice recognition software.  Despite best efforts to proofread,  errors can occur which can change  the documentation meaning.    Lonell Grandchild, MD 02/21/22 [phone]

## 2022-02-22 ENCOUNTER — Ambulatory Visit (INDEPENDENT_AMBULATORY_CARE_PROVIDER_SITE_OTHER): Payer: Medicare Other | Admitting: Cardiology

## 2022-02-22 ENCOUNTER — Encounter: Payer: Self-pay | Admitting: Cardiology

## 2022-02-22 VITALS — BP 142/100 | HR 119 | Ht 68.0 in | Wt 203.0 lb

## 2022-02-22 DIAGNOSIS — I471 Supraventricular tachycardia: Secondary | ICD-10-CM | POA: Diagnosis not present

## 2022-02-22 NOTE — Patient Instructions (Addendum)
Medication Instructions:  START Metoprolol Tartrate 25 mg 2 times a day.  *If you need a refill on your cardiac medications before your next appointment, please call your pharmacy*  Lab Work: NONE ordered at this time of appointment   If you have labs (blood work) drawn today and your tests are completely normal, you will receive your results only by: MyChart Message (if you have MyChart) OR A paper copy in the mail If you have any lab test that is abnormal or we need to change your treatment, we will call you to review the results.  Testing/Procedures: You have been referred to Cardiac Electrophysiology    Follow-Up: At [facility], you and your health needs are our priority.  As part of our continuing mission to provide you with exceptional heart care, we have created designated Provider Care Teams.  These Care Teams include your primary Cardiologist (physician) and Advanced Practice Providers (APPs -  Physician Assistants and Nurse Practitioners) who all work together to provide you with the care you need, when you need it.  Your next appointment:   As needed    The format for your next appointment:   In Person  Provider:   Rollene Rotunda, MD    Other Instructions   Important Information About Sugar

## 2022-02-22 NOTE — Progress Notes (Signed)
Cardiology Office Note   Date:  02/22/2022   ID:  Anthony Hoover, DOB 12/24/1965, MRN [id number]  PCP:  Waldon ReiningBrowning, Douglas, MD  Cardiologist:   Rollene RotundaJames Elyshia Kumagai, MD Referring:  ED  Chief Complaint  Patient presents with   Palpitations      History of Present Illness: Anthony Hoover is a 56 y.o. male who presents for evaluation of supraventricular tachycardia.  He was in the emergency room yesterday.  He had SVT documented and had to get adenosine x2.  (6 mg followed by 12 mg) I did review ER records for this visit.  He had narrow complex SVT and I could not discern any P waves.  The rate was 197.  Troponin was negative.  TSH was within normal limits.  Electrolytes are unremarkable.  He says he has been having this for about a year and is happening 1-2 times per month.  This was the fastest his heart rate had gone so his daughter drove him to the ER.  He does get lightheaded with it.  He feels it up in his throat.  He does not have substernal chest pressure, neck or arm discomfort.  He does not have any new shortness of breath.  He has been limited because of back pain.  He fell off a ladder some years ago and has had multiple fractures in chronic back pain.  He can drag garbage cans up to the curb and get some shortness of breath but this has been baseline.  He is not describing PND or orthopnea.  In November of last year he had an EF of 70 to 75% on echo.  There were no significant valvular abnormalities.  This was at the time of the hospitalization for flu.  At that time he had coughing.  He was having syncope.  It was felt to be vagal.  He has not had any further coughing and he does not have any further syncope.   Past Medical History:  Diagnosis Date   Alcohol abuse    Anxiety disorder    Back pain    pain management by Dr. Janna ArchonDiego   Compression fracture    T-spine   CVA (cerebral infarction)    2009   Hypertension    Insomnia    Tobacco abuse     Past Surgical  History:  Procedure Laterality Date   UMBILICAL HERNIA REPAIR       Current Outpatient Medications  Medication Sig Dispense Refill   albuterol (PROVENTIL) (2.5 MG/3ML) 0.083% nebulizer solution Take 3 mLs (2.5 mg total) by nebulization every 4 (four) hours as needed for wheezing or shortness of breath. 75 mL 2   albuterol (VENTOLIN HFA) 108 (90 Base) MCG/ACT inhaler Inhale 1-2 puffs into the lungs every 6 (six) hours as needed for wheezing or shortness of breath. 18 each 0   Cholecalciferol (VITAMIN D-3) 125 MCG (5000 UT) TABS Take 1 tablet by mouth daily.     guaiFENesin (MUCINEX) 600 MG 12 hr tablet Take 2 tablets (1,200 mg total) by mouth 2 (two) times daily. 30 tablet 0   lisinopril (ZESTRIL) 20 MG tablet Take 1 tablet (20 mg total) by mouth daily. 30 tablet 11   oxyCODONE (ROXICODONE) 15 MG immediate release tablet Take 15 mg by mouth 4 (four) times daily as needed.     REPATHA PUSHTRONEX SYSTEM 420 MG/3.5ML SOCT Inject 3.5 mLs into the skin every 30 (thirty) days.     No current facility-administered medications for  this visit.    Allergies:   Patient has no known allergies.    Social History:  The patient  reports that he has been smoking cigarettes. He has never used smokeless tobacco. He reports current alcohol use of about 8.0 standard drinks of alcohol per week. He reports that he does not use drugs.   Family History:  The patient's family history includes Cancer in his father; Diabetes in his mother; Heart disease (age of onset: 74) in his father.    ROS:  Please see the history of present illness.   Otherwise, review of systems are positive for none.   All other systems are reviewed and negative.    PHYSICAL EXAM: VS:  BP (!) 142/100 (BP Location: Left Arm, Patient Position: Sitting, Cuff Size: Normal)   Pulse (!) 119   Ht 5\' 8"  (1.727 m)   Wt 203 lb (92.1 kg)   BMI 30.87 kg/m  , BMI Body mass index is 30.87 kg/m. GENERAL:  Well appearing HEENT:  Pupils equal round  and reactive, fundi not visualized, oral mucosa unremarkable NECK:  No jugular venous distention, waveform within normal limits, carotid upstroke brisk and symmetric, no bruits, no thyromegaly LYMPHATICS:  No cervical, inguinal adenopathy LUNGS:  Clear to auscultation bilaterally BACK:  No CVA tenderness CHEST:  Unremarkable HEART:  PMI not displaced or sustained,S1 and S2 within normal limits, no S3, no S4, no clicks, no rubs, no murmurs ABD:  Flat, positive bowel sounds normal in frequency in pitch, no bruits, no rebound, no guarding, no midline pulsatile mass, no hepatomegaly, no splenomegaly EXT:  2 plus pulses throughout, no edema, no cyanosis no clubbing SKIN:  No rashes no nodules NEURO:  Cranial nerves II through XII grossly intact, motor grossly intact throughout PSYCH:  Cognitively intact, oriented to person place and time   EKG:  EKG is ordered today. The ekg ordered today demonstrates sinus rhythm, rate 119, axis within normal limits, intervals within normal limits, RSR prime V1 and V2, no acute ST-T wave changes.   Recent Labs: 05/20/2021: Magnesium 1.6 05/21/2021: ALT 50 02/20/2022: BUN 11; Creatinine, Ser 0.91; Hemoglobin 15.1; Platelets 293; Potassium 3.7; Sodium 138; TSH 2.391    Lipid Panel    Component Value Date/Time   CHOL (H) 03/07/2008 0500    235        ATP III CLASSIFICATION:  <200     mg/dL   Desirable  05/07/2008  mg/dL   Borderline High  433-295    mg/dL   High   TRIG >=188 416 0500   HDL 65 03/07/2008 0500   CHOLHDL 3.6 03/07/2008 0500   VLDL 26 03/07/2008 0500   LDLCALC (H) 03/07/2008 0500    144        Total Cholesterol/HDL:CHD Risk Coronary Heart Disease Risk Table                     Men   Women  1/2 Average Risk   3.4   3.3      Wt Readings from Last 3 Encounters:  02/22/22 203 lb (92.1 kg)  02/20/22 215 lb (97.5 kg)  05/20/21 217 lb (98.4 kg)      Other studies Reviewed: Additional studies/ records that were reviewed today  include: ED records, previous hospitalization. Review of the above records demonstrates:  Please see elsewhere in the note.     ASSESSMENT AND PLAN:  SVT: He is having multiple episodes of this over the last year at least  twice a month.  I will send him to EP for ablation.  He can continue the meds as listed.  Dyslipidemia: His LDL in March was 109.  Triglycerides 275 and total cholesterol 248.  He had not been taking his PCSK9 inhibitor and I have encouraged him to restart this.  He thought he might of been causing his palpitations.  Coronary calcium: Patient has strong family history and other risk factors.  However, he had negative troponins with his heart rate of 197.  No further testing is indicated.  He needs aggressive risk reduction and he and I talked about this.  Tobacco abuse: He does not think he be able to quit.  We did talk about this.  Alcohol abuse: He drinks a 12 pack/day and I suggested he get therapy for this.  Hypertension: His blood pressures is elevated.  I am going to restart metoprolol which she was taking after his hospitalization in November but which just kind of ran out.  I like to do 25 mg twice daily.  He is going to continue lisinopril.  He is actually not taking amlodipine though it was on his list.  I think he can remain off this for now.   Current medicines are reviewed at length with the patient today.  The patient does not have concerns regarding medicines.  The following changes have been made:  no change  Labs/ tests ordered today include: None  Orders Placed This Encounter  Procedures   Ambulatory referral to Cardiac Electrophysiology   EKG 12-Lead     Disposition:   FU with me as needed    Signed, Rollene Rotunda, MD  02/22/2022 2:22 PM    [facility]

## 2022-03-06 ENCOUNTER — Other Ambulatory Visit (HOSPITAL_COMMUNITY)
Admission: RE | Admit: 2022-03-06 | Discharge: 2022-03-06 | Disposition: A | Discharge status: Home / Self Care | Payer: Medicare Other | Source: Ambulatory Visit | Attending: Internal Medicine | Admitting: Internal Medicine

## 2022-03-06 ENCOUNTER — Encounter: Payer: Self-pay | Admitting: Internal Medicine

## 2022-03-06 ENCOUNTER — Ambulatory Visit: Payer: Medicare Other | Attending: Internal Medicine | Admitting: Internal Medicine

## 2022-03-06 VITALS — BP 140/92 | HR 111 | Ht 68.0 in | Wt 203.6 lb

## 2022-03-06 DIAGNOSIS — I471 Supraventricular tachycardia: Secondary | ICD-10-CM

## 2022-03-06 LAB — BASIC METABOLIC PANEL
Anion gap: 12 (ref 5–15)
BUN: 7 mg/dL (ref 6–20)
CO2: 25 mmol/L (ref 22–32)
Calcium: 9.3 mg/dL (ref 8.9–10.3)
Chloride: 100 mmol/L (ref 98–111)
Creatinine, Ser: 0.64 mg/dL (ref 0.61–1.24)
GFR, Estimated: >60 mL/min (ref >60)
Glucose, Bld: 111 mg/dL — ABNORMAL HIGH (ref 70–99)
Potassium: 4 mmol/L (ref 3.5–5.1)
Sodium: 137 mmol/L (ref 135–145)

## 2022-03-06 LAB — CBC
HCT: 39.4 % (ref 39.0–52.0)
Hemoglobin: 13.7 g/dL (ref 13.0–17.0)
MCH: 34 pg (ref 26.0–34.0)
MCHC: 34.8 g/dL (ref 30.0–36.0)
MCV: 97.8 fL (ref 80.0–100.0)
Platelets: 255 10*3/uL (ref 150–400)
RBC: 4.03 MIL/uL — ABNORMAL LOW (ref 4.22–5.81)
RDW: 11.9 % (ref 11.5–15.5)
WBC: 4.6 10*3/uL (ref 4.0–10.5)
nRBC: 0 % (ref 0.0–0.2)

## 2022-03-06 NOTE — Progress Notes (Signed)
HPI Anthony Hoover is referred by Dr. [facility] for evaluation of SVT. He is a pleasant 56 yo man with SVT at 200/min and has had multiple episodes lasting up to 3 hours. He has had a couple of ED presentations and has required IV adenosine. He has not had syncope but does have chest tightness.  No Known Allergies   Current Outpatient Medications  Medication Sig Dispense Refill   albuterol (PROVENTIL) (2.5 MG/3ML) 0.083% nebulizer solution Take 3 mLs (2.5 mg total) by nebulization every 4 (four) hours as needed for wheezing or shortness of breath. 75 mL 2   albuterol (VENTOLIN HFA) 108 (90 Base) MCG/ACT inhaler Inhale 1-2 puffs into the lungs every 6 (six) hours as needed for wheezing or shortness of breath. 18 each 0   Cholecalciferol (VITAMIN D-3) 125 MCG (5000 UT) TABS Take 1 tablet by mouth daily.     guaiFENesin (MUCINEX) 600 MG 12 hr tablet Take 2 tablets (1,200 mg total) by mouth 2 (two) times daily. 30 tablet 0   lisinopril (ZESTRIL) 20 MG tablet Take 1 tablet (20 mg total) by mouth daily. 30 tablet 11   metoprolol tartrate (LOPRESSOR) 25 MG tablet Take 25 mg by mouth 2 (two) times daily.     oxyCODONE (ROXICODONE) 15 MG immediate release tablet Take 15 mg by mouth 4 (four) times daily as needed.     REPATHA PUSHTRONEX SYSTEM 420 MG/3.5ML SOCT Inject 3.5 mLs into the skin every 30 (thirty) days.     No current facility-administered medications for this visit.     Past Medical History:  Diagnosis Date   Alcohol abuse    Anxiety disorder    Back pain    pain management by Dr. Janna Arch   Compression fracture    T-spine   CVA (cerebral infarction)    2009   Hypertension    Insomnia    Tobacco abuse     ROS:   All systems reviewed and negative except as noted in the HPI.   Past Surgical History:  Procedure Laterality Date   UMBILICAL HERNIA REPAIR       Family History  Problem Relation Age of Onset   Diabetes Mother    Cancer Father        Lung   Heart disease  Father 63     Social History   Socioeconomic History   Marital status: Single    Spouse name: Not on file   Number of children: Not on file   Years of education: Not on file   Highest education level: Not on file  Occupational History   Not on file  Tobacco Use   Smoking status: Every Day    Packs/day: 0.00    Types: Cigarettes   Smokeless tobacco: Never  Vaping Use   Vaping Use: Never used  Substance and Sexual Activity   Alcohol use: Yes    Alcohol/week: 8.0 standard drinks of alcohol    Types: 8 Cans of beer per week    Comment: daily   Drug use: No   Sexual activity: Not on file  Other Topics Concern   Not on file  Social History Narrative   Not on file   Social Determinants of Health   Financial Resource Strain: Not on file  Food Insecurity: Not on file  Transportation Needs: Not on file  Physical Activity: Not on file  Stress: Not on file  Social Connections: Not on file  Intimate Partner Violence: Not on  file     BP (!) 140/92   Pulse (!) 111   Ht 5\' 8"  (1.727 m)   Wt 203 lb 9.6 oz (92.4 kg)   SpO2 97%   BMI 30.96 kg/m   Physical Exam:  Well appearing 56 yo man, NAD HEENT: Unremarkable Neck:  No JVD, no thyromegally Lymphatics:  No adenopathy Back:  No CVA tenderness Lungs:  Clear with no wheezes HEART:  Regular rate rhythm, no murmurs, no rubs, no clicks Abd:  soft, positive bowel sounds, no organomegally, no rebound, no guarding Ext:  2 plus pulses, no edema, no cyanosis, no clubbing Skin:  No rashes no nodules Neuro:  CN II through XII intact, motor grossly intact  EKG - reviewed. 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 HTN - he will need ongoing medical therapy for this problem.   59 Anthony Pullin,MD

## 2022-03-06 NOTE — Patient Instructions (Signed)
Medication Instructions:  Your physician recommends that you continue on your current medications as directed. Please refer to the Current Medication list given to you today.  *If you need a refill on your cardiac medications before your next appointment, please call your pharmacy*   Lab Work: Your physician recommends that you return for lab work in: Today   If you have labs (blood work) drawn today and your tests are completely normal, you will receive your results only by: MyChart Message (if you have MyChart) OR A paper copy in the mail If you have any lab test that is abnormal or we need to change your treatment, we will call you to review the results.   Testing/Procedures: Your physician has recommended that you have an ablation. Catheter ablation is a medical procedure used to treat some cardiac arrhythmias (irregular heartbeats). During catheter ablation, a long, thin, flexible tube is put into a blood vessel in your groin (upper thigh), or neck. This tube is called an ablation catheter. It is then guided to your heart through the blood vessel. Radio frequency waves destroy small areas of heart tissue where abnormal heartbeats may cause an arrhythmia to start. Please see the instruction sheet given to you today.    Follow-Up: At [facility], you and your health needs are our priority.  As part of our continuing mission to provide you with exceptional heart care, we have created designated Provider Care Teams.  These Care Teams include your primary Cardiologist (physician) and Advanced Practice Providers (APPs -  Physician Assistants and Nurse Practitioners) who all work together to provide you with the care you need, when you need it.  We recommend signing up for the patient portal called "MyChart".  Sign up information is provided on this After Visit Summary.  MyChart is used to connect with patients for Virtual Visits (Telemedicine).  Patients are able to view lab/test  results, encounter notes, upcoming appointments, etc.  Non-urgent messages can be sent to your provider as well.   To learn more about what you can do with MyChart, go to [email or web address].    Your next appointment:   4 week(s) after Ablation   The format for your next appointment:   In Person  Provider:   Lewayne Bunting, MD    Other Instructions Thank you for choosing [facility]!     Electrophysiology/Ablation Procedure Instructions   Anthony Hoover  03/06/2022  You are scheduled for a SVT Ablation on Friday, September 22 with Dr. Lewayne Bunting.  1. Pre procedure Lab testing:  [facility]     2. Please arrive at the Main Entrance A at [facility]: [address] [place], [place] [address] on September 22 at 7:30 AM (This time is two hours before your procedure to ensure your preparation). Free valet parking service is available. You will check in at ADMITTING. The support person will be asked to wait in the waiting room.  It is OK to have someone drop you off and come back when you are ready to be discharged.        Special note: Every effort is made to have your procedure done on time. Please understand that emergencies sometimes delay scheduled procedures.    3.  Do not eat or drink after midnight prior to your procedure.     4.  On the morning of your procedure Do NOT take any medication.     5.  If you take a blood thinner  do not miss any doses prior to the morning of your procedure or your procedure will need to be rescheduled.   6.   Plan to go home the same day, you will only stay overnight if medically necessary. 7.   You MUST have a responsible adult to drive you home. 8.   An adult MUST be with you the first 24 hours after you arrive home. 9    Bring a current list of your medications, and the last time and date medication taken. 10  Bring ID and current insurance cards. 11. Please wear clothes that are easy to get on and off and wear  slip-on shoes.   Follow-up:You will follow up with Dr.Gregg Ladona Ridgel 4 weeks after your procedure.  * If you have ANY questions after you get home, please call the office [phone] and ask for EP RN or send a MyChart message.  FYI: For your safety, and to allow [place] to monitor your vital signs accurately during the surgery/procedure we request that if you have artificial nails, gel coating, SNS etc. Please have those removed prior to your surgery/procedure. Not having the nail coverings /polish removed may result in cancellation or delay of your surgery/procedure.     Important Information About Sugar

## 2022-03-06 NOTE — H&P (View-Only) (Signed)
    HPI Anthony Hoover is referred by Dr. JH for evaluation of SVT. He is a pleasant 55 yo man with SVT at 200/min and has had multiple episodes lasting up to 3 hours. He has had a couple of ED presentations and has required IV adenosine. He has not had syncope but does have chest tightness.  No Known Allergies   Current Outpatient Medications  Medication Sig Dispense Refill   albuterol (PROVENTIL) (2.5 MG/3ML) 0.083% nebulizer solution Take 3 mLs (2.5 mg total) by nebulization every 4 (four) hours as needed for wheezing or shortness of breath. 75 mL 2   albuterol (VENTOLIN HFA) 108 (90 Base) MCG/ACT inhaler Inhale 1-2 puffs into the lungs every 6 (six) hours as needed for wheezing or shortness of breath. 18 each 0   Cholecalciferol (VITAMIN D-3) 125 MCG (5000 UT) TABS Take 1 tablet by mouth daily.     guaiFENesin (MUCINEX) 600 MG 12 hr tablet Take 2 tablets (1,200 mg total) by mouth 2 (two) times daily. 30 tablet 0   lisinopril (ZESTRIL) 20 MG tablet Take 1 tablet (20 mg total) by mouth daily. 30 tablet 11   metoprolol tartrate (LOPRESSOR) 25 MG tablet Take 25 mg by mouth 2 (two) times daily.     oxyCODONE (ROXICODONE) 15 MG immediate release tablet Take 15 mg by mouth 4 (four) times daily as needed.     REPATHA PUSHTRONEX SYSTEM 420 MG/3.5ML SOCT Inject 3.5 mLs into the skin every 30 (thirty) days.     No current facility-administered medications for this visit.     Past Medical History:  Diagnosis Date   Alcohol abuse    Anxiety disorder    Back pain    pain management by Dr. DonDiego   Compression fracture    T-spine   CVA (cerebral infarction)    2009   Hypertension    Insomnia    Tobacco abuse     ROS:   All systems reviewed and negative except as noted in the HPI.   Past Surgical History:  Procedure Laterality Date   UMBILICAL HERNIA REPAIR       Family History  Problem Relation Age of Onset   Diabetes Mother    Cancer Father        Lung   Heart disease  Father 45     Social History   Socioeconomic History   Marital status: Single    Spouse name: Not on file   Number of children: Not on file   Years of education: Not on file   Highest education level: Not on file  Occupational History   Not on file  Tobacco Use   Smoking status: Every Day    Packs/day: 0.00    Types: Cigarettes   Smokeless tobacco: Never  Vaping Use   Vaping Use: Never used  Substance and Sexual Activity   Alcohol use: Yes    Alcohol/week: 8.0 standard drinks of alcohol    Types: 8 Cans of beer per week    Comment: daily   Drug use: No   Sexual activity: Not on file  Other Topics Concern   Not on file  Social History Narrative   Not on file   Social Determinants of Health   Financial Resource Strain: Not on file  Food Insecurity: Not on file  Transportation Needs: Not on file  Physical Activity: Not on file  Stress: Not on file  Social Connections: Not on file  Intimate Partner Violence: Not on   file     BP (!) 140/92   Pulse (!) 111   Ht 5\' 8"  (1.727 m)   Wt 203 lb 9.6 oz (92.4 kg)   SpO2 97%   BMI 30.96 kg/m   Physical Exam:  Well appearing 56 yo man, NAD HEENT: Unremarkable Neck:  No JVD, no thyromegally Lymphatics:  No adenopathy Back:  No CVA tenderness Lungs:  Clear with no wheezes HEART:  Regular rate rhythm, no murmurs, no rubs, no clicks Abd:  soft, positive bowel sounds, no organomegally, no rebound, no guarding Ext:  2 plus pulses, no edema, no cyanosis, no clubbing Skin:  No rashes no nodules Neuro:  CN II through XII intact, motor grossly intact  EKG - reviewed. 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 HTN - he will need ongoing medical therapy for this problem.   59 Anthony Milford,MD

## 2022-03-22 NOTE — Pre-Procedure Instructions (Signed)
Instructed patient on the following items: Arrival time 0730 Nothing to eat or drink after midnight No meds AM of procedure Responsible person to drive you home and stay with you for 24 hrs     

## 2022-03-23 ENCOUNTER — Other Ambulatory Visit: Payer: Self-pay

## 2022-03-23 ENCOUNTER — Ambulatory Visit (HOSPITAL_COMMUNITY)
Admission: RE | Admit: 2022-03-23 | Discharge: 2022-03-23 | Disposition: A | Discharge status: Home / Self Care | Payer: Medicare Other | Attending: Internal Medicine | Admitting: Internal Medicine

## 2022-03-23 ENCOUNTER — Ambulatory Visit (HOSPITAL_COMMUNITY)
Admission: RE | Disposition: A | Discharge status: Home / Self Care | Payer: Self-pay | Source: Home / Self Care | Attending: Internal Medicine

## 2022-03-23 DIAGNOSIS — I471 Supraventricular tachycardia: Secondary | ICD-10-CM | POA: Insufficient documentation

## 2022-03-23 DIAGNOSIS — I10 Essential (primary) hypertension: Secondary | ICD-10-CM | POA: Diagnosis not present

## 2022-03-23 HISTORY — PX: SVT ABLATION: EP1225

## 2022-03-23 SURGERY — SVT ABLATION

## 2022-03-23 MED ORDER — HEPARIN (PORCINE) IN NACL 1000-0.9 UT/500ML-% IV SOLN
INTRAVENOUS | Status: AC
  Filled 2022-03-23: qty 500

## 2022-03-23 MED ORDER — ACETAMINOPHEN 325 MG PO TABS: 650.0000 mg | ORAL_TABLET | ORAL | Status: DC | PRN

## 2022-03-23 MED ORDER — OXYCODONE HCL 5 MG PO TABS
ORAL_TABLET | ORAL | Status: AC
  Filled 2022-03-23: qty 3

## 2022-03-23 MED ORDER — MIDAZOLAM HCL 5 MG/5ML IJ SOLN
INTRAMUSCULAR | Status: DC | PRN
  Administered 2022-03-23 (×3): 2 mg via INTRAVENOUS

## 2022-03-23 MED ORDER — HEPARIN SODIUM (PORCINE) 1000 UNIT/ML IJ SOLN
INTRAMUSCULAR | Status: AC
  Filled 2022-03-23: qty 1

## 2022-03-23 MED ORDER — MIDAZOLAM HCL 5 MG/5ML IJ SOLN
INTRAMUSCULAR | Status: AC
  Filled 2022-03-23: qty 5

## 2022-03-23 MED ORDER — BUPIVACAINE HCL (PF) 0.25 % IJ SOLN
INTRAMUSCULAR | Status: AC
  Filled 2022-03-23: qty 60

## 2022-03-23 MED ORDER — SODIUM CHLORIDE 0.9 % IV SOLN: INTRAVENOUS | Status: DC

## 2022-03-23 MED ORDER — FENTANYL CITRATE (PF) 100 MCG/2ML IJ SOLN
INTRAMUSCULAR | Status: DC | PRN
  Administered 2022-03-23 (×3): 25 ug via INTRAVENOUS

## 2022-03-23 MED ORDER — METOPROLOL TARTRATE 25 MG PO TABS
25.0000 mg | ORAL_TABLET | Freq: Once | ORAL | Status: AC
  Administered 2022-03-23: 25 mg via ORAL
  Filled 2022-03-23: qty 1

## 2022-03-23 MED ORDER — ACETAMINOPHEN 325 MG PO TABS
ORAL_TABLET | ORAL | Status: AC
  Administered 2022-03-23: 650 mg via ORAL
  Filled 2022-03-23: qty 2

## 2022-03-23 MED ORDER — FENTANYL CITRATE (PF) 100 MCG/2ML IJ SOLN
INTRAMUSCULAR | Status: AC
  Filled 2022-03-23: qty 2

## 2022-03-23 MED ORDER — SODIUM CHLORIDE 0.9% FLUSH: 3.0000 mL | Freq: Two times a day (BID) | INTRAVENOUS | Status: DC

## 2022-03-23 MED ORDER — OXYCODONE HCL 5 MG PO TABS
15.0000 mg | ORAL_TABLET | Freq: Once | ORAL | Status: AC
  Administered 2022-03-23: 15 mg via ORAL

## 2022-03-23 MED ORDER — KETOROLAC TROMETHAMINE 15 MG/ML IJ SOLN
INTRAMUSCULAR | Status: AC
  Administered 2022-03-23: 30 mg
  Filled 2022-03-23: qty 2

## 2022-03-23 MED ORDER — SODIUM CHLORIDE 0.9 % IV SOLN: 250.0000 mL | INTRAVENOUS | Status: DC | PRN

## 2022-03-23 MED ORDER — KETOROLAC TROMETHAMINE 30 MG/ML IJ SOLN
30.0000 mg | Freq: Once | INTRAMUSCULAR | Status: DC
  Filled 2022-03-23: qty 1

## 2022-03-23 MED ORDER — ONDANSETRON HCL 4 MG/2ML IJ SOLN: 4.0000 mg | Freq: Four times a day (QID) | INTRAMUSCULAR | Status: DC | PRN

## 2022-03-23 MED ORDER — SODIUM CHLORIDE 0.9% FLUSH: 3.0000 mL | INTRAVENOUS | Status: DC | PRN

## 2022-03-23 SURGICAL SUPPLY — 11 items
CATH EZ STEER NAV 4MM D-F CUR (ABLATOR) IMPLANT
CATH JOSEPHSON QUAD-ALLRED 6FR (CATHETERS) IMPLANT
CATH POLARIS X REPROCESSED (CATHETERS) IMPLANT
PACK EP LATEX FREE (CUSTOM PROCEDURE TRAY) ×1
PACK EP LF (CUSTOM PROCEDURE TRAY) ×1 IMPLANT
PAD DEFIB RADIO PHYSIO CONN (PAD) ×1 IMPLANT
PATCH CARTO3 (PAD) IMPLANT
SHEATH PINNACLE 6F 10CM (SHEATH) IMPLANT
SHEATH PINNACLE 7F 10CM (SHEATH) IMPLANT
SHEATH PINNACLE 8F 10CM (SHEATH) IMPLANT
TUBING SMART ABLATE COOLFLOW (TUBING) IMPLANT

## 2022-03-23 NOTE — Progress Notes (Signed)
Right femoral venous sheaths removed and pressure held for 10 minutes.Groin level 0, no hematoma  or bruising. No apparent complications.Down time starts at 1215 for 6 hours.

## 2022-03-23 NOTE — Progress Notes (Signed)
Patient and daughter was given discharge instructions. Both verbalized understanding. 

## 2022-03-23 NOTE — Interval H&P Note (Signed)
History and Physical Interval Note:  03/23/2022 9:54 AM  Anthony Hoover  has presented today for surgery, with the diagnosis of svt.  The various methods of treatment have been discussed with the patient and family. After consideration of risks, benefits and other options for treatment, the patient has consented to  Procedure(s): SVT ABLATION (N/A) as a surgical intervention.  The patient's history has been reviewed, patient examined, no change in status, stable for surgery.  I have reviewed the patient's chart and labs.  Questions were answered to the patient's satisfaction.     [name]

## 2022-03-23 NOTE — Interval H&P Note (Signed)
History and Physical Interval Note:  03/23/2022 9:55 AM  [name]  has presented today for surgery, with the diagnosis of svt.  The various methods of treatment have been discussed with the patient and family. After consideration of risks, benefits and other options for treatment, the patient has consented to  Procedure(s): SVT ABLATION (N/A) as a surgical intervention.  The patient's history has been reviewed, patient examined, no change in status, stable for surgery.  I have reviewed the patient's chart and labs.  Questions were answered to the patient's satisfaction.     Anthony Hoover

## 2022-03-23 NOTE — Progress Notes (Signed)
Pt with cont'd HR 120s-Andy Tillery PA notified-orders for lopressor po

## 2022-03-23 NOTE — Discharge Instructions (Signed)

## 2022-03-26 ENCOUNTER — Encounter (HOSPITAL_COMMUNITY): Payer: Self-pay | Admitting: Internal Medicine

## 2022-03-26 MED FILL — Heparin Sod (Porcine)-NaCl IV Soln 1000 Unit/500ML-0.9%: INTRAVENOUS | Qty: 500 | Status: AC

## 2022-03-26 MED FILL — Bupivacaine HCl Preservative Free (PF) Inj 0.25%: INTRAMUSCULAR | Qty: 30 | Status: AC

## 2022-05-15 ENCOUNTER — Ambulatory Visit: Payer: Medicare Other | Attending: Internal Medicine | Admitting: Internal Medicine

## 2022-05-15 ENCOUNTER — Encounter: Payer: Self-pay | Admitting: Internal Medicine

## 2022-05-15 VITALS — BP 148/90 | HR 114 | Ht 69.0 in | Wt 201.4 lb

## 2022-05-15 DIAGNOSIS — I471 Supraventricular tachycardia, unspecified: Secondary | ICD-10-CM | POA: Diagnosis not present

## 2022-05-15 NOTE — Patient Instructions (Signed)
Medication Instructions:  Your physician recommends that you continue on your current medications as directed. Please refer to the Current Medication list given to you today.  *If you need a refill on your cardiac medications before your next appointment, please call your pharmacy*   Lab Work: NONE   If you have labs (blood work) drawn today and your tests are completely normal, you will receive your results only by: MyChart Message (if you have MyChart) OR A paper copy in the mail If you have any lab test that is abnormal or we need to change your treatment, we will call you to review the results.   Testing/Procedures: NONE    Follow-Up: At [facility], you and your health needs are our priority.  As part of our continuing mission to provide you with exceptional heart care, we have created designated Provider Care Teams.  These Care Teams include your primary Cardiologist (physician) and Advanced Practice Providers (APPs -  Physician Assistants and Nurse Practitioners) who all work together to provide you with the care you need, when you need it.  We recommend signing up for the patient portal called "MyChart".  Sign up information is provided on this After Visit Summary.  MyChart is used to connect with patients for Virtual Visits (Telemedicine).  Patients are able to view lab/test results, encounter notes, upcoming appointments, etc.  Non-urgent messages can be sent to your provider as well.   To learn more about what you can do with MyChart, go to [email or web address].    Your next appointment:    As Needed   The format for your next appointment:   In Person  Provider:   Gregg Taylor, MD    Other Instructions Thank you for choosing [facility]!    Important Information About Sugar       

## 2022-05-15 NOTE — Progress Notes (Signed)
HPI Anthony Hoover returns today for followup of SVT. 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 In the interim he has done well. He denies chest pain. He notes a brief episode of palpitations lasting seconds. None since.  No Known Allergies   Current Outpatient Medications  Medication Sig Dispense Refill   albuterol (PROVENTIL) (2.5 MG/3ML) 0.083% nebulizer solution Take 3 mLs (2.5 mg total) by nebulization every 4 (four) hours as needed for wheezing or shortness of breath. 75 mL 2   albuterol (VENTOLIN HFA) 108 (90 Base) MCG/ACT inhaler Inhale 1-2 puffs into the lungs every 6 (six) hours as needed for wheezing or shortness of breath. 18 each 0   aspirin EC 81 MG tablet Take 81 mg by mouth daily. Swallow whole.     Cholecalciferol (VITAMIN D-3) 125 MCG (5000 UT) TABS Take 5,000 Units by mouth daily.     guaiFENesin (MUCINEX) 600 MG 12 hr tablet Take 2 tablets (1,200 mg total) by mouth 2 (two) times daily. (Patient taking differently: Take 1,200 mg by mouth 2 (two) times daily as needed for to loosen phlegm or cough.) 30 tablet 0   lisinopril (ZESTRIL) 20 MG tablet Take 1 tablet (20 mg total) by mouth daily. 30 tablet 11   metoprolol tartrate (LOPRESSOR) 25 MG tablet Take 25 mg by mouth 2 (two) times daily.     oxyCODONE (ROXICODONE) 15 MG immediate release tablet Take 15 mg by mouth 4 (four) times daily as needed for pain.     REPATHA PUSHTRONEX SYSTEM 420 MG/3.5ML SOCT Inject 420 mg into the skin every 30 (thirty) days.     No current facility-administered medications for this visit.     Past Medical History:  Diagnosis Date   Alcohol abuse    Anxiety disorder    Back pain    pain management by Dr. Janna Arch   Compression fracture    T-spine   CVA (cerebral infarction)    2009   Hypertension    Insomnia    Tobacco abuse     ROS:   All systems reviewed  and negative except as noted in the HPI.   Past Surgical History:  Procedure Laterality Date   SVT ABLATION N/A 03/23/2022   Procedure: SVT ABLATION;  Surgeon: Marinus Maw, MD;  Location: [facility] INVASIVE CV LAB;  Service: Cardiovascular;  Laterality: N/A;   UMBILICAL HERNIA REPAIR       Family History  Problem Relation Age of Onset   Diabetes Mother    Cancer Father        Lung   Heart disease Father 53     Social History   Socioeconomic History   Marital status: Single    Spouse name: Not on file   Number of children: Not on file   Years of education: Not on file   Highest education level: Not on file  Occupational History   Not on file  Tobacco Use   Smoking status: Every Day    Packs/day: 0.00    Types: Cigarettes   Smokeless tobacco: Never  Vaping Use   Vaping Use: Never used  Substance and Sexual Activity   Alcohol use: Yes    Alcohol/week: 8.0 standard drinks of alcohol    Types: 8 Cans of beer per week    Comment: daily   Drug use:  No   Sexual activity: Not on file  Other Topics Concern   Not on file  Social History Narrative   Not on file   Social Determinants of Health   Financial Resource Strain: Not on file  Food Insecurity: Not on file  Transportation Needs: Not on file  Physical Activity: Not on file  Stress: Not on file  Social Connections: Not on file  Intimate Partner Violence: Not on file     BP (!) 148/90   Pulse (!) 114   Ht 5\' 9"  (1.753 m)   Wt 201 lb 6.4 oz (91.4 kg)   SpO2 97%   BMI 29.74 kg/m   Physical Exam:  Well appearing NAD HEENT: Unremarkable Neck:  No JVD, no thyromegally Lymphatics:  No adenopathy Back:  No CVA tenderness Lungs:  Clear with no wheezes HEART:  Regular rate rhythm, no murmurs, no rubs, no clicks Abd:  soft, positive bowel sounds, no organomegally, no rebound, no guarding Ext:  2 plus pulses, no edema, no cyanosis, no clubbing Skin:  No rashes no nodules Neuro:  CN II through XII intact, motor  grossly intact  EKG - nsr with ventricular pacing  Assess/Plan: 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 He will undergo watchful waiting.  HTN - we discussed restarting the lopressor and I will have him do this.  Sinus tachy - he did not take his beta blocker this morning.   Anthony Lunden,MD

## 2022-07-06 ENCOUNTER — Ambulatory Visit: Payer: Medicare Other | Attending: Internal Medicine | Admitting: Internal Medicine

## 2022-12-25 ENCOUNTER — Ambulatory Visit: Payer: Medicare Other | Admitting: Internal Medicine

## 2022-12-26 ENCOUNTER — Encounter: Payer: Self-pay | Admitting: Internal Medicine

## 2023-08-14 NOTE — Progress Notes (Unsigned)
   New Patient Office Visit   Subjective   Patient ID: NYZIR DUBOIS, male    DOB: 31-Jan-1966  Age: 58 y.o. MRN: [id number]  CC: No chief complaint on file.   HPI TREYSEAN PETRUZZI 58 year old male, presents to establish care. He  has a past medical history of Alcohol abuse, Anxiety disorder, Back pain, Compression fracture, CVA (cerebral infarction), Hypertension, Insomnia, and Tobacco abuse.  HPI    Outpatient Encounter Medications as of 08/16/2023  Medication Sig   albuterol (PROVENTIL) (2.5 MG/3ML) 0.083% nebulizer solution Take 3 mLs (2.5 mg total) by nebulization every 4 (four) hours as needed for wheezing or shortness of breath.   albuterol (VENTOLIN HFA) 108 (90 Base) MCG/ACT inhaler Inhale 1-2 puffs into the lungs every 6 (six) hours as needed for wheezing or shortness of breath.   aspirin EC 81 MG tablet Take 81 mg by mouth daily. Swallow whole.   Cholecalciferol (VITAMIN D-3) 125 MCG (5000 UT) TABS Take 5,000 Units by mouth daily.   guaiFENesin (MUCINEX) 600 MG 12 hr tablet Take 2 tablets (1,200 mg total) by mouth 2 (two) times daily. (Patient taking differently: Take 1,200 mg by mouth 2 (two) times daily as needed for to loosen phlegm or cough.)   lisinopril (ZESTRIL) 20 MG tablet Take 1 tablet (20 mg total) by mouth daily.   metoprolol tartrate (LOPRESSOR) 25 MG tablet Take 25 mg by mouth 2 (two) times daily.   oxyCODONE (ROXICODONE) 15 MG immediate release tablet Take 15 mg by mouth 4 (four) times daily as needed for pain.   REPATHA PUSHTRONEX SYSTEM 420 MG/3.5ML SOCT Inject 420 mg into the skin every 30 (thirty) days.   No facility-administered encounter medications on file as of 08/16/2023.    Past Surgical History:  Procedure Laterality Date   SVT ABLATION N/A 03/23/2022   Procedure: SVT ABLATION;  Surgeon: Marinus Maw, MD;  Location: [facility] INVASIVE CV LAB;  Service: Cardiovascular;  Laterality: N/A;   UMBILICAL HERNIA REPAIR      ROS    Objective    There were  no vitals taken for this visit.  Physical Exam    Assessment & Plan:  There are no diagnoses linked to this encounter.  No follow-ups on file.   Cruzita Lederer Newman Nip, FNP

## 2023-08-14 NOTE — Patient Instructions (Signed)

## 2023-08-16 ENCOUNTER — Encounter: Payer: 59 | Admitting: Family Medicine

## 2023-08-16 DIAGNOSIS — I10 Essential (primary) hypertension: Secondary | ICD-10-CM

## 2023-08-16 DIAGNOSIS — Z1211 Encounter for screening for malignant neoplasm of colon: Secondary | ICD-10-CM

## 2023-08-16 DIAGNOSIS — E559 Vitamin D deficiency, unspecified: Secondary | ICD-10-CM

## 2023-08-16 DIAGNOSIS — E038 Other specified hypothyroidism: Secondary | ICD-10-CM

## 2023-08-16 DIAGNOSIS — R7301 Impaired fasting glucose: Secondary | ICD-10-CM

## 2023-08-16 NOTE — Progress Notes (Signed)
No show

## 2023-08-22 ENCOUNTER — Other Ambulatory Visit (HOSPITAL_COMMUNITY): Payer: Self-pay | Admitting: Internal Medicine

## 2023-08-22 DIAGNOSIS — F1010 Alcohol abuse, uncomplicated: Secondary | ICD-10-CM

## 2023-08-22 DIAGNOSIS — R7989 Other specified abnormal findings of blood chemistry: Secondary | ICD-10-CM

## 2023-08-29 ENCOUNTER — Encounter: Payer: Self-pay | Admitting: Family Medicine

## 2023-09-02 ENCOUNTER — Other Ambulatory Visit: Payer: Self-pay

## 2023-09-02 ENCOUNTER — Ambulatory Visit (HOSPITAL_COMMUNITY): Admission: RE | Admit: 2023-09-02 | Payer: 59 | Source: Ambulatory Visit

## 2023-09-02 ENCOUNTER — Encounter (HOSPITAL_COMMUNITY): Payer: Self-pay

## 2023-09-02 ENCOUNTER — Emergency Department (HOSPITAL_COMMUNITY)

## 2023-09-02 ENCOUNTER — Emergency Department (HOSPITAL_COMMUNITY)
Admission: EM | Admit: 2023-09-02 | Discharge: 2023-09-02 | Disposition: A | Discharge status: Home / Self Care | Attending: Emergency Medicine | Admitting: Emergency Medicine

## 2023-09-02 ENCOUNTER — Encounter (HOSPITAL_COMMUNITY): Payer: Self-pay | Admitting: *Deleted

## 2023-09-02 DIAGNOSIS — Z7982 Long term (current) use of aspirin: Secondary | ICD-10-CM | POA: Diagnosis not present

## 2023-09-02 DIAGNOSIS — F17200 Nicotine dependence, unspecified, uncomplicated: Secondary | ICD-10-CM | POA: Diagnosis not present

## 2023-09-02 DIAGNOSIS — R519 Headache, unspecified: Secondary | ICD-10-CM | POA: Insufficient documentation

## 2023-09-02 DIAGNOSIS — J101 Influenza due to other identified influenza virus with other respiratory manifestations: Secondary | ICD-10-CM | POA: Diagnosis not present

## 2023-09-02 DIAGNOSIS — J111 Influenza due to unidentified influenza virus with other respiratory manifestations: Secondary | ICD-10-CM

## 2023-09-02 DIAGNOSIS — I10 Essential (primary) hypertension: Secondary | ICD-10-CM | POA: Diagnosis not present

## 2023-09-02 DIAGNOSIS — R059 Cough, unspecified: Secondary | ICD-10-CM | POA: Diagnosis present

## 2023-09-02 LAB — CBC WITH DIFFERENTIAL/PLATELET
Abs Immature Granulocytes: 0.02 10*3/uL (ref 0.00–0.07)
Basophils Absolute: 0 10*3/uL (ref 0.0–0.1)
Basophils Relative: 1 %
Eosinophils Absolute: 0 10*3/uL (ref 0.0–0.5)
Eosinophils Relative: 0 %
HCT: 36.9 % — ABNORMAL LOW (ref 39.0–52.0)
Hemoglobin: 12.4 g/dL — ABNORMAL LOW (ref 13.0–17.0)
Immature Granulocytes: 0 %
Lymphocytes Relative: 6 %
Lymphs Abs: 0.3 10*3/uL — ABNORMAL LOW (ref 0.7–4.0)
MCH: 33.2 pg (ref 26.0–34.0)
MCHC: 33.6 g/dL (ref 30.0–36.0)
MCV: 98.7 fL (ref 80.0–100.0)
Monocytes Absolute: 0.9 10*3/uL (ref 0.1–1.0)
Monocytes Relative: 16 %
Neutro Abs: 4.4 10*3/uL (ref 1.7–7.7)
Neutrophils Relative %: 77 %
Platelets: 177 10*3/uL (ref 150–400)
RBC: 3.74 MIL/uL — ABNORMAL LOW (ref 4.22–5.81)
RDW: 11.9 % (ref 11.5–15.5)
WBC: 5.7 10*3/uL (ref 4.0–10.5)
nRBC: 0 % (ref 0.0–0.2)

## 2023-09-02 LAB — BASIC METABOLIC PANEL
Anion gap: 10 (ref 5–15)
BUN: 8 mg/dL (ref 6–20)
CO2: 24 mmol/L (ref 22–32)
Calcium: 9.2 mg/dL (ref 8.9–10.3)
Chloride: 95 mmol/L — ABNORMAL LOW (ref 98–111)
Creatinine, Ser: 0.72 mg/dL (ref 0.61–1.24)
GFR, Estimated: >60 mL/min (ref >60)
Glucose, Bld: 111 mg/dL — ABNORMAL HIGH (ref 70–99)
Potassium: 4 mmol/L (ref 3.5–5.1)
Sodium: 129 mmol/L — ABNORMAL LOW (ref 135–145)

## 2023-09-02 LAB — RESP PANEL BY RT-PCR (RSV, FLU A&B, COVID)  RVPGX2
Influenza A by PCR: POSITIVE — AB
Influenza B by PCR: NEGATIVE
Resp Syncytial Virus by PCR: NEGATIVE
SARS Coronavirus 2 by RT PCR: NEGATIVE

## 2023-09-02 MED ORDER — MAGNESIUM SULFATE 2 GM/50ML IV SOLN
2.0000 g | Freq: Once | INTRAVENOUS | Status: AC
  Administered 2023-09-02: 2 g via INTRAVENOUS
  Filled 2023-09-02: qty 50

## 2023-09-02 MED ORDER — IPRATROPIUM-ALBUTEROL 0.5-2.5 (3) MG/3ML IN SOLN
3.0000 mL | Freq: Once | RESPIRATORY_TRACT | Status: AC
  Administered 2023-09-02: 3 mL via RESPIRATORY_TRACT
  Filled 2023-09-02: qty 3

## 2023-09-02 MED ORDER — PREDNISONE 50 MG PO TABS
60.0000 mg | ORAL_TABLET | Freq: Once | ORAL | Status: AC
  Administered 2023-09-02: 60 mg via ORAL
  Filled 2023-09-02: qty 1

## 2023-09-02 MED ORDER — OSELTAMIVIR PHOSPHATE 75 MG PO CAPS: 75.0000 mg | ORAL_CAPSULE | Freq: Two times a day (BID) | ORAL | 0 refills | Status: AC

## 2023-09-02 MED ORDER — PREDNISONE 20 MG PO TABS: 60.0000 mg | ORAL_TABLET | Freq: Every day | ORAL | 0 refills | Status: AC

## 2023-09-02 NOTE — ED Provider Notes (Signed)
 [place] EMERGENCY DEPARTMENT AT [facility] Provider Note   CSN: [id number] Arrival date & time: 09/02/23  1914     History  Chief Complaint  Patient presents with   Cough    Anthony Hoover is a 58 y.o. male.  History of hypertension.  Smoker.  Complaining of cough headache body aches since yesterday morning.  Cough has been productive of sputum, he has not looked to see what color.  No nausea vomiting diarrhea.  No sick contacts or recent travel.  Uses inhaler infrequently.  The history is provided by the patient.  Cough Cough characteristics:  Productive Sputum characteristics:  Nondescript Severity:  Moderate Onset quality:  Gradual Duration:  2 days Progression:  Unchanged Chronicity:  New Smoker: yes   Relieved by:  Nothing Worsened by:  Activity Associated symptoms: headaches, myalgias, shortness of breath and wheezing   Associated symptoms: no chest pain and no fever        Home Medications Prior to Admission medications   Medication Sig Start Date End Date Taking? Authorizing Provider  albuterol (PROVENTIL) (2.5 MG/3ML) 0.083% nebulizer solution Take 3 mLs (2.5 mg total) by nebulization every 4 (four) hours as needed for wheezing or shortness of breath. 05/22/21 05/22/22  Erick Blinks, MD  albuterol (VENTOLIN HFA) 108 (90 Base) MCG/ACT inhaler Inhale 1-2 puffs into the lungs every 6 (six) hours as needed for wheezing or shortness of breath. 05/22/21   Erick Blinks, MD  aspirin EC 81 MG tablet Take 81 mg by mouth daily. Swallow whole.    [provider]  Cholecalciferol (VITAMIN D-3) 125 MCG (5000 UT) TABS Take 5,000 Units by mouth daily.    [provider]  guaiFENesin (MUCINEX) 600 MG 12 hr tablet Take 2 tablets (1,200 mg total) by mouth 2 (two) times daily. Patient taking differently: Take 1,200 mg by mouth 2 (two) times daily as needed for to loosen phlegm or cough. 05/22/21   Erick Blinks, MD  lisinopril (ZESTRIL) 20 MG  tablet Take 1 tablet (20 mg total) by mouth daily. 05/22/21 05/22/22  Erick Blinks, MD  metoprolol tartrate (LOPRESSOR) 25 MG tablet Take 25 mg by mouth 2 (two) times daily. 02/23/22   [provider]  oxyCODONE (ROXICODONE) 15 MG immediate release tablet Take 15 mg by mouth 4 (four) times daily as needed for pain. 03/22/21   [provider]  REPATHA PUSHTRONEX SYSTEM 420 MG/3.5ML SOCT Inject 420 mg into the skin every 30 (thirty) days. 04/11/21   [provider]      Allergies    Patient has no known allergies.    Review of Systems   Review of Systems  Constitutional:  Negative for fever.  Respiratory:  Positive for cough, shortness of breath and wheezing.   Cardiovascular:  Negative for chest pain.  Musculoskeletal:  Positive for myalgias.  Neurological:  Positive for headaches.    Physical Exam Updated Vital Signs BP 134/81   Pulse (!) 118   Temp 98.8 F (37.1 C) (Oral)   Resp (!) 25   Ht 5\' 9"  (1.753 m)   Wt 91.2 kg   SpO2 96%   BMI 29.68 kg/m  Physical Exam Vitals and nursing note reviewed.  Constitutional:      General: He is not in acute distress.    Appearance: Normal appearance. He is well-developed.  HENT:     Head: Normocephalic and atraumatic.  Eyes:     Conjunctiva/sclera: Conjunctivae normal.  Cardiovascular:     Rate  and Rhythm: Regular rhythm. Tachycardia present.     Heart sounds: No murmur heard. Pulmonary:     Effort: Pulmonary effort is normal. No respiratory distress.     Breath sounds: Wheezing and rhonchi present.  Abdominal:     Palpations: Abdomen is soft.     Tenderness: There is no abdominal tenderness. There is no guarding or rebound.  Musculoskeletal:        General: No swelling.     Cervical back: Neck supple.  Skin:    General: Skin is warm and dry.     Capillary Refill: Capillary refill takes less than 2 seconds.  Neurological:     General: No focal deficit present.     Mental Status: He is alert.      ED Results / Procedures / Treatments   Labs (all labs ordered are listed, but only abnormal results are displayed) Labs Reviewed  RESP PANEL BY RT-PCR (RSV, FLU A&B, COVID)  RVPGX2 - Abnormal; Notable for the following components:      Result Value   Influenza A by PCR POSITIVE (*)    All other components within normal limits  BASIC METABOLIC PANEL - Abnormal; Notable for the following components:   Sodium 129 (*)    Chloride 95 (*)    Glucose, Bld 111 (*)    All other components within normal limits  CBC WITH DIFFERENTIAL/PLATELET - Abnormal; Notable for the following components:   RBC 3.74 (*)    Hemoglobin 12.4 (*)    HCT 36.9 (*)    Lymphs Abs 0.3 (*)    All other components within normal limits    EKG EKG Interpretation Date/Time:  Monday September 02 2023 06:58:55 EST Ventricular Rate:  119 PR Interval:  171 QRS Duration:  98 QT Interval:  318 QTC Calculation: 448 R Axis:   39  Text Interpretation: Sinus tachycardia RSR' in V1 or V2, probably normal variant No significant change since prior 9/23 Confirmed by Meridee Score [phone]) on 09/02/2023 7:08:58 AM  Radiology DG Chest Port 1 View Result Date: 09/02/2023 CLINICAL DATA:  sob EXAM: PORTABLE CHEST 1 VIEW COMPARISON:  Chest XR, 05/20/2021.  CT chest, 10/30/2017 FINDINGS: Cardiomediastinal silhouette is within normal limits. Lungs are well inflated. No focal consolidation or mass. No pleural effusion or pneumothorax. BILATERAL posttraumatic rib deformities. No acute displaced fracture. IMPRESSION: No acute cardiopulmonary process Electronically Signed   By: Roanna Banning M.D.   On: 09/02/2023 07:26    Procedures Procedures    Medications Ordered in ED Medications  ipratropium-albuterol (DUONEB) 0.5-2.5 (3) MG/3ML nebulizer solution 3 mL (has no administration in time range)  predniSONE (DELTASONE) tablet 60 mg (has no administration in time range)  magnesium sulfate IVPB 2 g 50 mL (has no administration in time  range)    ED Course/ Medical Decision Making/ A&P Clinical Course as of 09/02/23 1701  Mon Sep 02, 2023  0721 Chest x-ray interpreted by me as atelectasis left base no clear infiltrate.  Awaiting radiology reading. [MB]  [phone] Patient tested positive for influenza.  Sodium mildly low at 129.  He satting in the low 90s but in no distress now.  Will discharge on steroids and Tamiflu.  He says he has an inhaler and nebulizer at home. [MB]    Clinical Course User Index [MB] Terrilee Files, MD  Medical Decision Making Amount and/or Complexity of Data Reviewed Labs: ordered. Radiology: ordered.  Risk Prescription drug management.   This patient complains of cough short of breath body aches headache; this involves an extensive number of treatment Options and is a complaint that carries with it a high risk of complications and morbidity. The differential includes flu, COVID, pneumonia, sepsis  I ordered, reviewed and interpreted labs, which included CBC normal mildly low hemoglobin chemistries with low sodium, influenza positive I ordered medication IV magnesium oral steroids, breathing treatment and reviewed PMP when indicated. I ordered imaging studies which included chest x-ray and I independently    visualized and interpreted imaging which showed no acute infiltrate Additional history obtained from patient significant other Previous records obtained and reviewed in epic, no recent admissions Cardiac monitoring reviewed, sinus tachycardia Social determinants considered, tobacco use Critical Interventions: None  After the interventions stated above, I reevaluated the patient and found patient to be satting well on room air with mild increased work of breathing Admission and further testing considered, no indications for admission at this time.  Will treat with Tamiflu, patient will continue his inhalers and follow-up with PCP.  Return instructions  discussed         Final Clinical Impression(s) / ED Diagnoses Final diagnoses:  Influenza    Rx / DC Orders ED Discharge Orders          Ordered    predniSONE (DELTASONE) 20 MG tablet  Daily        09/02/23 0842    oseltamivir (TAMIFLU) 75 MG capsule  Every 12 hours        09/02/23 0842              Terrilee Files, MD 09/02/23 [phone]

## 2023-09-02 NOTE — ED Triage Notes (Signed)
 Pt c/o headache, cough and sob that started yesterday  Pt denies any sick contacts

## 2023-09-02 NOTE — Discharge Instructions (Addendum)
 Drink plenty of fluids.  Tylenol and ibuprofen for fever or pain.  Use your inhaler or nebulizer for shortness of breath.  Follow-up with your regular doctor.  Return to the emergency department if any worsening or concerning symptoms.

## 2023-09-09 ENCOUNTER — Ambulatory Visit (HOSPITAL_COMMUNITY)

## 2023-09-09 ENCOUNTER — Encounter (HOSPITAL_COMMUNITY): Payer: Self-pay

## 2023-09-13 ENCOUNTER — Encounter

## 2023-09-13 ENCOUNTER — Telehealth: Admitting: Nurse Practitioner

## 2023-09-13 DIAGNOSIS — J40 Bronchitis, not specified as acute or chronic: Secondary | ICD-10-CM

## 2023-09-14 MED ORDER — BENZONATATE 200 MG PO CAPS: 200.0000 mg | ORAL_CAPSULE | Freq: Two times a day (BID) | ORAL | 0 refills | Status: DC | PRN

## 2023-09-14 MED ORDER — AZITHROMYCIN 250 MG PO TABS: ORAL_TABLET | ORAL | 0 refills | Status: AC

## 2023-09-14 NOTE — Progress Notes (Signed)

## 2023-09-14 NOTE — Progress Notes (Signed)
 I have spent 5 minutes in review of e-visit questionnaire, review and updating patient chart, medical decision making and response to patient.   Claiborne Rigg, NP

## 2023-09-20 ENCOUNTER — Ambulatory Visit (HOSPITAL_COMMUNITY): Attending: Internal Medicine

## 2023-10-24 ENCOUNTER — Ambulatory Visit (HOSPITAL_COMMUNITY)
Admission: RE | Admit: 2023-10-24 | Discharge: 2023-10-24 | Disposition: A | Discharge status: Home / Self Care | Source: Ambulatory Visit | Attending: Internal Medicine | Admitting: Internal Medicine

## 2023-10-24 DIAGNOSIS — R7989 Other specified abnormal findings of blood chemistry: Secondary | ICD-10-CM | POA: Diagnosis present

## 2023-10-24 DIAGNOSIS — F1010 Alcohol abuse, uncomplicated: Secondary | ICD-10-CM | POA: Diagnosis present

## 2023-12-26 ENCOUNTER — Telehealth: Admitting: Physician Assistant

## 2023-12-26 DIAGNOSIS — J069 Acute upper respiratory infection, unspecified: Secondary | ICD-10-CM

## 2023-12-26 MED ORDER — IPRATROPIUM BROMIDE 0.03 % NA SOLN: 2.0000 | Freq: Two times a day (BID) | NASAL | 0 refills | Status: AC

## 2023-12-26 MED ORDER — ALBUTEROL SULFATE HFA 108 (90 BASE) MCG/ACT IN AERS: 2.0000 | INHALATION_SPRAY | Freq: Four times a day (QID) | RESPIRATORY_TRACT | 0 refills | Status: AC | PRN

## 2023-12-26 MED ORDER — BENZONATATE 100 MG PO CAPS: 100.0000 mg | ORAL_CAPSULE | Freq: Three times a day (TID) | ORAL | 0 refills | Status: AC | PRN

## 2023-12-26 NOTE — Progress Notes (Signed)
 E-Visit for Upper Respiratory Infection   We are sorry you are not feeling well.  Here is how we plan to help!  Based on what you have shared with me, it looks like you may have a viral upper respiratory infection.  Upper respiratory infections are caused by a large number of viruses; however, rhinovirus is the most common cause.   Symptoms vary from person to person, with common symptoms including sore throat, cough, fatigue or lack of energy and feeling of general discomfort.  A low-grade fever of up to 100.4 may present, but is often uncommon.  Symptoms vary however, and are closely related to a person's age or underlying illnesses.  The most common symptoms associated with an upper respiratory infection are nasal discharge or congestion, cough, sneezing, headache and pressure in the ears and face.  These symptoms usually persist for about 3 to 10 days, but can last up to 2 weeks.  It is important to know that upper respiratory infections do not cause serious illness or complications in most cases.    Upper respiratory infections can be transmitted from person to person, with the most common method of transmission being a person's hands.  The virus is able to live on the skin and can infect other persons for up to 2 hours after direct contact.  Also, these can be transmitted when someone coughs or sneezes; thus, it is important to cover the mouth to reduce this risk.  To keep the spread of the illness at bay, good hand hygiene is very important.  This is an infection that is most likely caused by a virus. There are no specific treatments other than to help you with the symptoms until the infection runs its course.  We are sorry you are not feeling well.  Here is how we plan to help!   For nasal congestion, you may use an oral decongestants such as Mucinex  D or if you have glaucoma or high blood pressure use plain Mucinex .  Saline nasal spray or nasal drops can help and can safely be used as often as  needed for congestion.  For your congestion, I have prescribed Ipratropium Bromide nasal spray 0.03% two sprays in each nostril 2-3 times a day  If you do not have a history of heart disease, hypertension, diabetes or thyroid  disease, prostate/bladder issues or glaucoma, you may also use Sudafed to treat nasal congestion.  It is highly recommended that you consult with a pharmacist or your primary care physician to ensure this medication is safe for you to take.     If you have a cough, you may use cough suppressants such as Delsym and Robitussin.  If you have glaucoma or high blood pressure, you can also use Coricidin HBP.   For cough I have prescribed for you A prescription cough medication called Tessalon  Perles 100 mg. You may take 1-2 capsules every 8 hours as needed for cough]  I have also sent in an albuterol  inhaler to help relax your airways.  When you or a family member pick up your prescriptions, make sure to get a home COVID test to take as a precaution.  If you have a sore or scratchy throat, use a saltwater gargle-  to  teaspoon of salt dissolved in a 4-ounce to 8-ounce glass of warm water.  Gargle the solution for approximately 15-30 seconds and then spit.  It is important not to swallow the solution.  You can also use throat lozenges/cough drops and Chloraseptic  spray to help with throat pain or discomfort.  Warm or cold liquids can also be helpful in relieving throat pain.  For headache, pain or general discomfort, you can use Ibuprofen  or Tylenol  as directed.   Some authorities believe that zinc sprays or the use of Echinacea may shorten the course of your symptoms.   HOME CARE Only take medications as instructed by your medical team. Be sure to drink plenty of fluids. Water is fine as well as fruit juices, sodas and electrolyte beverages. You may want to stay away from caffeine or alcohol . If you are nauseated, try taking small sips of liquids. How do you know if you are  getting enough fluid? Your urine should be a pale yellow or almost colorless. Get rest. Taking a steamy shower or using a humidifier may help nasal congestion and ease sore throat pain. You can place a towel over your head and breathe in the steam from hot water coming from a faucet. Using a saline nasal spray works much the same way. Cough drops, hard candies and sore throat lozenges may ease your cough. Avoid close contacts especially the very young and the elderly Cover your mouth if you cough or sneeze Always remember to wash your hands.   GET HELP RIGHT AWAY IF: You develop worsening fever. If your symptoms do not improve within 10 days You develop yellow or green discharge from your nose over 3 days. You have coughing fits You develop a severe head ache or visual changes. You develop shortness of breath, difficulty breathing or start having chest pain Your symptoms persist after you have completed your treatment plan  MAKE SURE YOU  Understand these instructions. Will watch your condition. Will get help right away if you are not doing well or get worse.  Thank you for choosing an e-visit.  Your e-visit answers were reviewed by a board certified advanced clinical practitioner to complete your personal care plan. Depending upon the condition, your plan could have included both over the counter or prescription medications.  Please review your pharmacy choice. Make sure the pharmacy is open so you can pick up prescription now. If there is a problem, you may contact your provider through [employer] and have the prescription routed to another pharmacy.  Your safety is important to us . If you have drug allergies check your prescription carefully.   For the next 24 hours you can use MyChart to ask questions about today's visit, request a non-urgent call back, or ask for a work or school excuse. You will get an email in the next two days asking about your experience. I hope that  your e-visit has been valuable and will speed your recovery.

## 2023-12-26 NOTE — Progress Notes (Signed)
 I have spent 5 minutes in review of e-visit questionnaire, review and updating patient chart, medical decision making and response to patient.   Piedad Climes, PA-C

## 2023-12-26 NOTE — Progress Notes (Signed)
 Message sent to patient requesting further input regarding current symptoms. Awaiting patient response.

## 2024-01-16 ENCOUNTER — Telehealth: Admitting: Family Medicine

## 2024-01-16 DIAGNOSIS — R300 Dysuria: Secondary | ICD-10-CM

## 2024-01-16 NOTE — Progress Notes (Signed)
 E-Visit for Urinary Problems  Based on what you shared with me, I feel your condition warrants further evaluation and I recommend that you be seen for a face to face office visit.  Male bladder infections are not very common.  We worry about prostate or kidney conditions.  The standard of care is to examine the abdomen and kidneys, and to do a urine and blood test to make sure that something more serious is not going on.  We recommend that you see a provider today.  If your doctor's office is closed [place] has the following Urgent Cares:   NOTE: There will be NO CHARGE for this E-Visit   If you are having a true medical emergency, please call 911.     For an urgent face to face visit, [facility] has multiple urgent care centers for your convenience.  Click the link below for the full list of locations and hours, walk-in wait times, appointment scheduling options and driving directions:  Urgent Care - [place], [place], [place], [place], [place], [place]       Your MyChart E-visit questionnaire answers were reviewed by a board certified advanced clinical practitioner to complete your personal care plan based on your specific symptoms.  Thank you for using e-Visits.

## 2024-03-26 ENCOUNTER — Ambulatory Visit: Admitting: Orthopedic Surgery

## 2024-04-02 ENCOUNTER — Ambulatory Visit: Admitting: Orthopedic Surgery

## 2024-08-05 DIAGNOSIS — M5410 Radiculopathy, site unspecified: Secondary | ICD-10-CM | POA: Insufficient documentation

## 2024-08-05 DIAGNOSIS — K760 Fatty (change of) liver, not elsewhere classified: Secondary | ICD-10-CM | POA: Insufficient documentation

## 2024-08-10 ENCOUNTER — Ambulatory Visit: Admitting: Orthopedic Surgery

## 2024-08-28 ENCOUNTER — Ambulatory Visit: Admitting: Student in an Organized Health Care Education/Training Program
